# Patient Record
Sex: Female | Born: 1975 | Race: White | Hispanic: Yes | Marital: Married | Smoking: Never smoker
Health system: Southern US, Community
[De-identification: ages and names within clinical notes are randomized; demographics above are authoritative.]

## PROBLEM LIST (undated history)

## (undated) DIAGNOSIS — E7800 Pure hypercholesterolemia, unspecified: Secondary | ICD-10-CM

## (undated) DIAGNOSIS — F3181 Bipolar II disorder: Secondary | ICD-10-CM

## (undated) DIAGNOSIS — F419 Anxiety disorder, unspecified: Secondary | ICD-10-CM

## (undated) DIAGNOSIS — N6452 Nipple discharge: Secondary | ICD-10-CM

## (undated) HISTORY — DX: Bipolar II disorder: F31.81

## (undated) HISTORY — DX: Anxiety disorder, unspecified: F41.9

---

## 2006-06-24 ENCOUNTER — Ambulatory Visit: Payer: Self-pay | Admitting: Family Medicine

## 2006-08-01 ENCOUNTER — Emergency Department (HOSPITAL_COMMUNITY): Admission: EM | Admit: 2006-08-01 | Discharge: 2006-08-02 | Payer: Self-pay | Admitting: *Deleted

## 2006-08-02 ENCOUNTER — Encounter (INDEPENDENT_AMBULATORY_CARE_PROVIDER_SITE_OTHER): Payer: Self-pay | Admitting: Family Medicine

## 2006-08-02 ENCOUNTER — Ambulatory Visit: Payer: Self-pay | Admitting: Family Medicine

## 2006-09-16 ENCOUNTER — Ambulatory Visit: Payer: Self-pay | Admitting: Internal Medicine

## 2006-09-16 LAB — CONVERTED CEMR LAB
Cholesterol: 215 mg/dL — ABNORMAL HIGH (ref 0–200)
LDL Cholesterol: 146 mg/dL — ABNORMAL HIGH (ref 0–99)
Triglycerides: 190 mg/dL — ABNORMAL HIGH (ref <150)

## 2006-11-15 ENCOUNTER — Encounter: Payer: Self-pay | Admitting: Internal Medicine

## 2006-11-15 ENCOUNTER — Ambulatory Visit: Payer: Self-pay | Admitting: Internal Medicine

## 2006-11-15 LAB — CONVERTED CEMR LAB
ALT: 13 units/L (ref 0–35)
BUN: 9 mg/dL (ref 6–23)
CO2: 21 meq/L (ref 19–32)
Calcium: 9.3 mg/dL (ref 8.4–10.5)
Chloride: 106 meq/L (ref 96–112)
Cholesterol: 174 mg/dL (ref 0–200)
Creatinine, Ser: 0.64 mg/dL (ref 0.40–1.20)
Glucose, Bld: 101 mg/dL — ABNORMAL HIGH (ref 70–99)
Total CHOL/HDL Ratio: 5.8
Triglycerides: 161 mg/dL — ABNORMAL HIGH (ref <150)

## 2006-12-01 ENCOUNTER — Ambulatory Visit: Payer: Self-pay | Admitting: Internal Medicine

## 2007-01-04 ENCOUNTER — Ambulatory Visit: Payer: Self-pay | Admitting: *Deleted

## 2007-01-17 ENCOUNTER — Ambulatory Visit: Payer: Self-pay | Admitting: Internal Medicine

## 2007-01-17 LAB — CONVERTED CEMR LAB
Albumin: 4.4 g/dL (ref 3.5–5.2)
Alkaline Phosphatase: 113 units/L (ref 39–117)
BUN: 7 mg/dL (ref 6–23)
Calcium: 9.7 mg/dL (ref 8.4–10.5)
Chloride: 107 meq/L (ref 96–112)
Glucose, Bld: 93 mg/dL (ref 70–99)
HDL: 28 mg/dL — ABNORMAL LOW (ref >39)
Potassium: 4.3 meq/L (ref 3.5–5.3)
Triglycerides: 241 mg/dL — ABNORMAL HIGH (ref <150)

## 2007-01-24 ENCOUNTER — Ambulatory Visit: Payer: Self-pay | Admitting: Internal Medicine

## 2007-08-02 ENCOUNTER — Encounter (INDEPENDENT_AMBULATORY_CARE_PROVIDER_SITE_OTHER): Payer: Self-pay | Admitting: Family Medicine

## 2007-08-02 ENCOUNTER — Ambulatory Visit: Payer: Self-pay | Admitting: Internal Medicine

## 2007-08-02 LAB — CONVERTED CEMR LAB
ALT: 17 units/L (ref 0–35)
Alkaline Phosphatase: 104 units/L (ref 39–117)
Basophils Absolute: 0 10*3/uL (ref 0.0–0.1)
Eosinophils Absolute: 0.2 10*3/uL (ref 0.0–0.7)
Eosinophils Relative: 2 % (ref 0–5)
HCT: 41 % (ref 36.0–46.0)
LDL Cholesterol: 217 mg/dL — ABNORMAL HIGH (ref 0–99)
MCV: 93.2 fL (ref 78.0–100.0)
Platelets: 358 10*3/uL (ref 150–400)
RDW: 14.4 % (ref 11.5–15.5)
Sodium: 139 meq/L (ref 135–145)
Total Bilirubin: 0.3 mg/dL (ref 0.3–1.2)
Total Protein: 7.8 g/dL (ref 6.0–8.3)
Triglycerides: 220 mg/dL — ABNORMAL HIGH (ref <150)
VLDL: 44 mg/dL — ABNORMAL HIGH (ref 0–40)

## 2008-01-25 ENCOUNTER — Ambulatory Visit: Payer: Self-pay | Admitting: Internal Medicine

## 2008-02-23 ENCOUNTER — Ambulatory Visit: Payer: Self-pay | Admitting: Internal Medicine

## 2008-02-23 ENCOUNTER — Encounter (INDEPENDENT_AMBULATORY_CARE_PROVIDER_SITE_OTHER): Payer: Self-pay | Admitting: Internal Medicine

## 2008-02-23 LAB — CONVERTED CEMR LAB
AST: 13 units/L (ref 0–37)
Albumin: 4.2 g/dL (ref 3.5–5.2)
Alkaline Phosphatase: 122 units/L — ABNORMAL HIGH (ref 39–117)
BUN: 8 mg/dL (ref 6–23)
HDL: 26 mg/dL — ABNORMAL LOW (ref >39)
LDL Cholesterol: 132 mg/dL — ABNORMAL HIGH (ref 0–99)
Potassium: 4.2 meq/L (ref 3.5–5.3)
Sodium: 142 meq/L (ref 135–145)
TSH: 4.344 microintl units/mL (ref 0.350–4.50)
Total Bilirubin: 0.5 mg/dL (ref 0.3–1.2)
Total CHOL/HDL Ratio: 7.3
VLDL: 31 mg/dL (ref 0–40)

## 2008-07-05 ENCOUNTER — Ambulatory Visit: Payer: Self-pay | Admitting: Internal Medicine

## 2008-07-23 ENCOUNTER — Ambulatory Visit: Payer: Self-pay | Admitting: Internal Medicine

## 2008-09-04 ENCOUNTER — Ambulatory Visit: Payer: Self-pay | Admitting: Internal Medicine

## 2008-09-04 ENCOUNTER — Encounter (INDEPENDENT_AMBULATORY_CARE_PROVIDER_SITE_OTHER): Payer: Self-pay | Admitting: Adult Health

## 2008-09-18 ENCOUNTER — Encounter (INDEPENDENT_AMBULATORY_CARE_PROVIDER_SITE_OTHER): Payer: Self-pay | Admitting: Adult Health

## 2008-09-18 ENCOUNTER — Ambulatory Visit: Payer: Self-pay | Admitting: Internal Medicine

## 2008-09-18 LAB — CONVERTED CEMR LAB
ALT: 33 units/L (ref 0–35)
Alkaline Phosphatase: 86 units/L (ref 39–117)
Basophils Absolute: 0 10*3/uL (ref 0.0–0.1)
Basophils Relative: 0 % (ref 0–1)
Creatinine, Ser: 0.64 mg/dL (ref 0.40–1.20)
Eosinophils Absolute: 0.4 10*3/uL (ref 0.0–0.7)
LDL Cholesterol: 184 mg/dL — ABNORMAL HIGH (ref 0–99)
MCHC: 30.4 g/dL (ref 30.0–36.0)
Neutro Abs: 7.3 10*3/uL (ref 1.7–7.7)
Neutrophils Relative %: 60 % (ref 43–77)
Platelets: 328 10*3/uL (ref 150–400)
RBC: 4.2 M/uL (ref 3.87–5.11)
RDW: 14.9 % (ref 11.5–15.5)
Sodium: 138 meq/L (ref 135–145)
Total Bilirubin: 0.4 mg/dL (ref 0.3–1.2)
Total CHOL/HDL Ratio: 8.1
Total Protein: 7.4 g/dL (ref 6.0–8.3)
VLDL: 37 mg/dL (ref 0–40)

## 2008-11-29 ENCOUNTER — Ambulatory Visit: Payer: Self-pay | Admitting: Internal Medicine

## 2008-11-29 ENCOUNTER — Encounter (INDEPENDENT_AMBULATORY_CARE_PROVIDER_SITE_OTHER): Payer: Self-pay | Admitting: Adult Health

## 2008-11-29 LAB — CONVERTED CEMR LAB
ALT: 26 units/L (ref 0–35)
AST: 22 units/L (ref 0–37)
Albumin: 4.5 g/dL (ref 3.5–5.2)
Alkaline Phosphatase: 100 units/L (ref 39–117)
BUN: 7 mg/dL (ref 6–23)
Calcium: 9.6 mg/dL (ref 8.4–10.5)
Chloride: 108 meq/L (ref 96–112)
Creatinine, Ser: 0.69 mg/dL (ref 0.40–1.20)
HDL: 29 mg/dL — ABNORMAL LOW (ref >39)
LDL Cholesterol: 122 mg/dL — ABNORMAL HIGH (ref 0–99)
Potassium: 4 meq/L (ref 3.5–5.3)
Total CHOL/HDL Ratio: 6.4

## 2008-12-12 ENCOUNTER — Ambulatory Visit: Payer: Self-pay | Admitting: Internal Medicine

## 2009-03-05 ENCOUNTER — Encounter (INDEPENDENT_AMBULATORY_CARE_PROVIDER_SITE_OTHER): Payer: Self-pay | Admitting: Adult Health

## 2009-03-05 ENCOUNTER — Ambulatory Visit: Payer: Self-pay | Admitting: Internal Medicine

## 2009-03-05 LAB — CONVERTED CEMR LAB
ALT: 22 units/L (ref 0–35)
AST: 18 units/L (ref 0–37)
Alkaline Phosphatase: 98 units/L (ref 39–117)
Basophils Absolute: 0 10*3/uL (ref 0.0–0.1)
Basophils Relative: 0 % (ref 0–1)
CO2: 22 meq/L (ref 19–32)
Creatinine, Ser: 0.67 mg/dL (ref 0.40–1.20)
Eosinophils Absolute: 0.3 10*3/uL (ref 0.0–0.7)
Lithium Lvl: 0.71 meq/L — ABNORMAL LOW (ref 0.80–1.40)
MCHC: 31.6 g/dL (ref 30.0–36.0)
MCV: 91.7 fL (ref 78.0–100.0)
Monocytes Relative: 4 % (ref 3–12)
Neutro Abs: 6.9 10*3/uL (ref 1.7–7.7)
Neutrophils Relative %: 59 % (ref 43–77)
Platelets: 352 10*3/uL (ref 150–400)
RBC: 4.56 M/uL (ref 3.87–5.11)
Sodium: 138 meq/L (ref 135–145)
T3 Uptake Ratio: 28.4 % (ref 22.5–37.0)
T4, Total: 8.4 ug/dL (ref 5.0–12.5)
Total Bilirubin: 0.4 mg/dL (ref 0.3–1.2)
Total Protein: 8.1 g/dL (ref 6.0–8.3)

## 2009-03-06 ENCOUNTER — Ambulatory Visit: Payer: Self-pay | Admitting: Internal Medicine

## 2009-07-22 ENCOUNTER — Ambulatory Visit: Payer: Self-pay | Admitting: Internal Medicine

## 2009-08-27 ENCOUNTER — Ambulatory Visit: Payer: Self-pay | Admitting: Internal Medicine

## 2009-11-06 ENCOUNTER — Ambulatory Visit: Payer: Self-pay | Admitting: Obstetrics and Gynecology

## 2009-11-07 ENCOUNTER — Encounter: Payer: Self-pay | Admitting: Obstetrics and Gynecology

## 2009-11-07 LAB — CONVERTED CEMR LAB: Clue Cells Wet Prep HPF POC: NONE SEEN

## 2009-11-22 ENCOUNTER — Encounter (INDEPENDENT_AMBULATORY_CARE_PROVIDER_SITE_OTHER): Payer: Self-pay | Admitting: *Deleted

## 2009-11-22 LAB — CONVERTED CEMR LAB
ALT: 16 units/L (ref 0–35)
AST: 15 units/L (ref 0–37)
CO2: 23 meq/L (ref 19–32)
Calcium: 9.8 mg/dL (ref 8.4–10.5)
Chloride: 107 meq/L (ref 96–112)
Creatinine, Ser: 0.59 mg/dL (ref 0.40–1.20)
Lithium Lvl: 0.54 meq/L — ABNORMAL LOW (ref 0.80–1.40)
Sodium: 140 meq/L (ref 135–145)
Total CHOL/HDL Ratio: 9.7
Total Protein: 7.9 g/dL (ref 6.0–8.3)
VLDL: 39 mg/dL (ref 0–40)

## 2010-03-03 ENCOUNTER — Emergency Department (HOSPITAL_COMMUNITY)
Admission: EM | Admit: 2010-03-03 | Discharge: 2010-03-03 | Payer: Self-pay | Source: Home / Self Care | Admitting: Emergency Medicine

## 2010-03-04 LAB — POCT PREGNANCY, URINE: Preg Test, Ur: NEGATIVE

## 2010-03-26 ENCOUNTER — Encounter (INDEPENDENT_AMBULATORY_CARE_PROVIDER_SITE_OTHER): Payer: Self-pay | Admitting: *Deleted

## 2010-03-26 LAB — CONVERTED CEMR LAB
ALT: 19 units/L (ref 0–35)
Albumin: 4.5 g/dL (ref 3.5–5.2)
Alkaline Phosphatase: 91 units/L (ref 39–117)
CO2: 24 meq/L (ref 19–32)
LDL Cholesterol: 156 mg/dL — ABNORMAL HIGH (ref 0–99)
Potassium: 4.3 meq/L (ref 3.5–5.3)
Sodium: 137 meq/L (ref 135–145)
Total Bilirubin: 0.4 mg/dL (ref 0.3–1.2)
Total Protein: 7.5 g/dL (ref 6.0–8.3)
VLDL: 45 mg/dL — ABNORMAL HIGH (ref 0–40)

## 2011-03-02 ENCOUNTER — Encounter (HOSPITAL_COMMUNITY): Payer: Self-pay

## 2011-03-02 ENCOUNTER — Emergency Department (HOSPITAL_COMMUNITY)
Admission: EM | Admit: 2011-03-02 | Discharge: 2011-03-02 | Disposition: A | Discharge status: Home / Self Care | Payer: Self-pay | Attending: Emergency Medicine | Admitting: Emergency Medicine

## 2011-03-02 ENCOUNTER — Emergency Department (HOSPITAL_COMMUNITY): Payer: Self-pay

## 2011-03-02 DIAGNOSIS — R109 Unspecified abdominal pain: Secondary | ICD-10-CM | POA: Insufficient documentation

## 2011-03-02 DIAGNOSIS — R112 Nausea with vomiting, unspecified: Secondary | ICD-10-CM | POA: Insufficient documentation

## 2011-03-02 DIAGNOSIS — E7800 Pure hypercholesterolemia, unspecified: Secondary | ICD-10-CM | POA: Insufficient documentation

## 2011-03-02 DIAGNOSIS — K5900 Constipation, unspecified: Secondary | ICD-10-CM | POA: Insufficient documentation

## 2011-03-02 HISTORY — DX: Pure hypercholesterolemia, unspecified: E78.00

## 2011-03-02 LAB — URINE MICROSCOPIC-ADD ON

## 2011-03-02 LAB — URINALYSIS, ROUTINE W REFLEX MICROSCOPIC
Nitrite: NEGATIVE
Protein, ur: NEGATIVE mg/dL
Urobilinogen, UA: 2 mg/dL — ABNORMAL HIGH (ref 0.0–1.0)

## 2011-03-02 LAB — DIFFERENTIAL
Basophils Absolute: 0 10*3/uL (ref 0.0–0.1)
Eosinophils Relative: 1 % (ref 0–5)
Lymphocytes Relative: 19 % (ref 12–46)
Neutro Abs: 12.3 10*3/uL — ABNORMAL HIGH (ref 1.7–7.7)
Neutrophils Relative %: 79 % — ABNORMAL HIGH (ref 43–77)

## 2011-03-02 LAB — COMPREHENSIVE METABOLIC PANEL
ALT: 26 U/L (ref 0–35)
AST: 25 U/L (ref 0–37)
CO2: 25 mEq/L (ref 19–32)
Calcium: 10.3 mg/dL (ref 8.4–10.5)
GFR calc non Af Amer: >90 mL/min (ref >90)
Sodium: 136 mEq/L (ref 135–145)

## 2011-03-02 LAB — CBC
Platelets: 331 10*3/uL (ref 150–400)
RDW: 13.7 % (ref 11.5–15.5)
WBC: 15.7 10*3/uL — ABNORMAL HIGH (ref 4.0–10.5)

## 2011-03-02 MED ORDER — ONDANSETRON HCL 4 MG/2ML IJ SOLN
4.0000 mg | Freq: Once | INTRAMUSCULAR | Status: AC
  Administered 2011-03-02: 4 mg via INTRAVENOUS
  Filled 2011-03-02: qty 2

## 2011-03-02 MED ORDER — PROMETHAZINE HCL 25 MG PO TABS: 25.0000 mg | ORAL_TABLET | Freq: Four times a day (QID) | ORAL | Status: AC | PRN

## 2011-03-02 MED ORDER — IOHEXOL 300 MG/ML  SOLN
100.0000 mL | Freq: Once | INTRAMUSCULAR | Status: AC | PRN
  Administered 2011-03-02: 100 mL via INTRAVENOUS

## 2011-03-02 MED ORDER — SODIUM CHLORIDE 0.9 % IV SOLN
INTRAVENOUS | Status: DC
  Administered 2011-03-02: 03:00:00 via INTRAVENOUS

## 2011-03-02 MED ORDER — MORPHINE SULFATE 4 MG/ML IJ SOLN
4.0000 mg | Freq: Once | INTRAMUSCULAR | Status: AC
  Administered 2011-03-02: 4 mg via INTRAVENOUS
  Filled 2011-03-02: qty 1

## 2011-03-02 NOTE — ED Notes (Signed)
Patient c/o abdominal pain x 1 hr w/vomiting; EMS states she has generalized pain somewhat great in the RLQ; Status of appendix unknown.  Per EMS, she said she felt like she is pregnant, but she believes she is not.  Patient is Spanish speaking.  Her son is on the way and bilingual.  States no BM today.  BM yesterday.  Home meds:  Haldol, Pravastatin, lithium, Klonapine.

## 2011-03-02 NOTE — ED Notes (Signed)
Advised CT the patient finished her contrast.

## 2011-03-02 NOTE — ED Notes (Signed)
D/C instructions reviewed via phone interpreter. Pt verbalized understanding. NAD at d/c.

## 2011-03-02 NOTE — ED Provider Notes (Signed)
History     CSN: [id number]  Arrival date & time 03/02/11  0224   First MD Initiated Contact with Patient 03/02/11 0246      Chief Complaint  Patient presents with  . Emesis  . Abdominal Pain    (Consider location/radiation/quality/duration/timing/severity/associated sxs/prior treatment) Patient is a 36 y.o. female presenting with vomiting and abdominal pain. The history is provided by the patient and a relative. The history is limited by a language barrier. A language interpreter was used.  Emesis  Associated symptoms include abdominal pain. Pertinent negatives include no diarrhea, no fever and no headaches.  Abdominal Pain The primary symptoms of the illness include abdominal pain, nausea and vomiting. The primary symptoms of the illness do not include fever, diarrhea, dysuria or vaginal bleeding.  Additional symptoms associated with the illness include constipation. Symptoms associated with the illness do not include hematuria or back pain.   the patient is a 35 year old, female, with no significant past medical history, except for having a C-section.  She presents to the emergency department complaining of lower abdominal pain, and nausea and vomiting.  For the past few hours.  She denies diarrhea.  She denies urinary tract symptoms.  Her last period was on December 21 it was normal.  She denies fevers, chills, or respiratory symptoms.  Her son, who speaks perfect English servings as Nurse, learning disability. Past Medical History  Diagnosis Date  . Hypercholesteremia     Past Surgical History  Procedure Date  . Cesarean section     No family history on file.  History  Substance Use Topics  . Smoking status: Never Smoker   . Smokeless tobacco: Not on file  . Alcohol Use: No    OB History    Grav Para Term Preterm Abortions TAB SAB Ect Mult Living                  Review of Systems  Constitutional: Negative for fever.  Eyes: Negative for redness.  Gastrointestinal: Positive for  nausea, vomiting, abdominal pain and constipation. Negative for diarrhea.  Genitourinary: Negative for dysuria, hematuria and vaginal bleeding.  Musculoskeletal: Negative for back pain.  Skin: Negative for rash.  Neurological: Negative for light-headedness, numbness and headaches.  Psychiatric/Behavioral: Negative for confusion.  All other systems reviewed and are negative.    Allergies  Review of patient's allergies indicates no known allergies.  Home Medications  No current outpatient prescriptions on file.  BP 129/79  Pulse 87  Temp(Src) 98.1 F (36.7 C) (Oral)  Resp 19  SpO2 99%  LMP 01/30/2011  Physical Exam  Constitutional: She is oriented to person, place, and time. She appears well-developed and well-nourished.       Uncomfortable appearing  HENT:  Head: Normocephalic and atraumatic.  Eyes: Pupils are equal, round, and reactive to light.  Neck: Normal range of motion.  Cardiovascular: Normal rate, regular rhythm and normal heart sounds.   No murmur heard. Pulmonary/Chest: Effort normal and breath sounds normal. No respiratory distress. She has no wheezes. She has no rales.  Abdominal: Soft. She exhibits no distension and no mass. There is tenderness. There is no rebound and no guarding.       Suprapubic and right lower quadrant tenderness without guarding  Musculoskeletal: Normal range of motion. She exhibits no edema and no tenderness.  Neurological: She is alert and oriented to person, place, and time. No cranial nerve deficit.  Skin: Skin is warm and dry. No rash noted. No erythema.  Psychiatric: She has  a normal mood and affect. Her behavior is normal.    ED Course  Procedures (including critical care time) 36 year old, female, with no significant past medical history presents with abdominal pain, and nausea and vomiting.  For the past few hours.  No other symptoms.  She has mild tenderness, with no peritoneal signs in the lower abdomen.  We will perform  laboratory testing, and a CAT scan of her abdomen and treat her symptomatically.   Labs Reviewed  PREGNANCY, URINE  URINALYSIS, ROUTINE W REFLEX MICROSCOPIC  CBC  DIFFERENTIAL  COMPREHENSIVE METABOLIC PANEL   No results found.   No diagnosis found.  7:23 AM I spoke with the radiologist about the ct.  We specifically went over the results again after I provided him with the info that the wbc = 15.7K  He said no appy or other severe acute process  I explained findings to pt with pacific interpreter phones. Her son had left.   Her pain is almost all gone.  Her nausea is gone.  She has minimal ttp in suprapubic area.   Will release.   MDM   Abdominal pain        Nicholes Stairs, MD 03/02/11 [id number]

## 2011-03-02 NOTE — ED Notes (Signed)
Patient was advised by CT to drink her contrast. I advised the patient to drink it slowly to avoid G/I upset.

## 2011-03-02 NOTE — ED Notes (Signed)
Asked pt for urine x2, states she does not have to go. Will try again in 15 minutes

## 2011-03-02 NOTE — ED Notes (Signed)
Spoke with patient via interpreter phones and introduced self and pt has mid abd pain that hurts a litte.  vss and bowel sounds hypoactive

## 2011-03-02 NOTE — ED Notes (Signed)
The patient's sister gave me her husband's phone number and advised that we should call him.  His name is Exie Parody, and he can be reached @ [phone].

## 2011-03-02 NOTE — ED Notes (Signed)
Patient states she wants something for nausea.

## 2011-03-02 NOTE — ED Notes (Signed)
Reminded patient to continue drinking her contrast.

## 2011-06-24 ENCOUNTER — Encounter: Payer: Self-pay | Admitting: Obstetrics and Gynecology

## 2012-03-09 ENCOUNTER — Ambulatory Visit: Payer: Self-pay | Admitting: Emergency Medicine

## 2012-04-01 ENCOUNTER — Ambulatory Visit (INDEPENDENT_AMBULATORY_CARE_PROVIDER_SITE_OTHER): Payer: Self-pay | Admitting: Emergency Medicine

## 2012-04-01 ENCOUNTER — Encounter: Payer: Self-pay | Admitting: Emergency Medicine

## 2012-04-01 VITALS — BP 109/74 | HR 64 | Ht 62.5 in | Wt 178.0 lb

## 2012-04-01 DIAGNOSIS — B351 Tinea unguium: Secondary | ICD-10-CM

## 2012-04-01 DIAGNOSIS — E66811 Obesity, class 1: Secondary | ICD-10-CM

## 2012-04-01 DIAGNOSIS — Z0000 Encounter for general adult medical examination without abnormal findings: Secondary | ICD-10-CM

## 2012-04-01 DIAGNOSIS — S90211A Contusion of right great toe with damage to nail, initial encounter: Secondary | ICD-10-CM

## 2012-04-01 DIAGNOSIS — S90129A Contusion of unspecified lesser toe(s) without damage to nail, initial encounter: Secondary | ICD-10-CM

## 2012-04-01 DIAGNOSIS — E669 Obesity, unspecified: Secondary | ICD-10-CM

## 2012-04-01 NOTE — Assessment & Plan Note (Addendum)
No red flags today.  Obese.  Will start screening mammogram at 38 given history of sister with breast cancer in 48s.  Will check screening labs and pap smear once she gets the orange card.

## 2012-04-01 NOTE — Progress Notes (Signed)
Interpreter Nirvana Blanchett Namihira for Dr Booth 

## 2012-04-01 NOTE — Progress Notes (Signed)
  Subjective:    Patient ID: Vanessa Harrison, female    DOB: May 17, 1975, 37 y.o.   MRN: [id number]  HPI Jaclyne Haverstick is here for a NP appointment.  New Patient I have reviewed and updated the following as appropriate: allergies, current medications, past family history, past medical history, past social history, past surgical history and problem list PMHx: bipolar - seen by Dr. Ladona Ridgel on lithium, klonopin and haldol  FHx: sister with breast cancer in 17s SHx: never smoker  Toenail issues Reports that her right great toenail turned black about 2 months ago.  Denies any trauma but states that she doesn't have very good shoes.  No pain.  Left great toenail is yellowed.  Review of Systems Patient Information Form: Screening and ROS Review of Symptoms  General:  Negative for nexplained weight loss, fever Skin: Negative for new or changing mole, sore that won't heal HEENT: Negative for trouble hearing, trouble seeing, ringing in ears, mouth sores, hoarseness, change in voice, dysphagia. CV:  Negative for chest pain, dyspnea, edema, palpitations Resp: Negative for cough, dyspnea, hemoptysis GI: Negative for nausea, vomiting, diarrhea, constipation, abdominal pain, melena, hematochezia. GU: Negative for dysuria, incontinence, urinary hesitance, hematuria, vaginal or penile discharge, polyuria, sexual difficulty, lumps in testicle or breasts MSK: Negative for muscle cramps or aches, joint pain or swelling Neuro: Negative for headaches, weakness, numbness, dizziness, passing out/fainting Psych: Negative for depression, + anxiety, memory problems      Objective:   Physical Exam BP 109/74  Pulse 64  Ht 5' 2.5" (1.588 m)  Wt 178 lb (80.74 kg)  BMI 32.02 kg/m2  LMP 03/16/2012 Gen: alert, cooperative, NAD HEENT: AT/[place], sclera white, MMM Neck: supple, no LAD CV: RRR, no murmurs Pulm: CTAB, no wheezes or rales Abd: +BS, soft, NTND Ext: no edema, 2+ DP pulses bilaterally Skin: black  discoloration of right great toe nail; left great toenail thickened and friable. Neuro: alert, oriented, no focal deficits     Assessment & Plan:   Onychomycosis: left great toenail.  Discussed options with include trying OTC Lamisil which will not likely work.  Alternative involves trying an oral medication - reviewed risks and will not pursue at this time.  She does state that a relative brought her an injection for fungus that she has received.  She will bring it in at her next appointment to see if it is safe to use.  Right great toe discoloration:  Likely related to pressure from poor footwear.  Discussed that it will eventually grow out, but may take up to 6 months.

## 2012-04-01 NOTE — Patient Instructions (Addendum)
Fue Psychiatrist conocerte!  Leanna Sato cita para verme para una prueba de Papanicolaou y conseguir que su extraccin de sangre despus de recibir la tarjeta naranja.

## 2012-09-09 HISTORY — PX: APPENDECTOMY: SHX54

## 2012-09-22 ENCOUNTER — Emergency Department (HOSPITAL_COMMUNITY)
Admission: EM | Admit: 2012-09-22 | Discharge: 2012-09-23 | Disposition: A | Discharge status: Home / Self Care | Payer: Self-pay | Attending: Emergency Medicine | Admitting: Emergency Medicine

## 2012-09-22 ENCOUNTER — Encounter (HOSPITAL_COMMUNITY): Payer: Self-pay | Admitting: Emergency Medicine

## 2012-09-22 DIAGNOSIS — F419 Anxiety disorder, unspecified: Secondary | ICD-10-CM

## 2012-09-22 DIAGNOSIS — F411 Generalized anxiety disorder: Secondary | ICD-10-CM | POA: Insufficient documentation

## 2012-09-22 DIAGNOSIS — F3189 Other bipolar disorder: Secondary | ICD-10-CM | POA: Insufficient documentation

## 2012-09-22 DIAGNOSIS — Z79899 Other long term (current) drug therapy: Secondary | ICD-10-CM | POA: Insufficient documentation

## 2012-09-22 DIAGNOSIS — Z862 Personal history of diseases of the blood and blood-forming organs and certain disorders involving the immune mechanism: Secondary | ICD-10-CM | POA: Insufficient documentation

## 2012-09-22 DIAGNOSIS — Z8639 Personal history of other endocrine, nutritional and metabolic disease: Secondary | ICD-10-CM | POA: Insufficient documentation

## 2012-09-22 NOTE — ED Notes (Signed)
PT. REPORTS DIFFICULTY SWALLOWING / THICK MUCUS IN THROAT FOR SEVERAL DAYS WORSE WHEN LYING ON BED , RESPIRATIONS UNLABORED / AIRWAY INTACT , INTERPRETER PHONE ( SPANISH) USED AT TRIAGE . DENIES SORE THROAT . NO FEVER OR CHILLS.

## 2012-09-23 ENCOUNTER — Encounter (HOSPITAL_COMMUNITY): Payer: Self-pay | Admitting: Radiology

## 2012-09-23 ENCOUNTER — Emergency Department (HOSPITAL_COMMUNITY): Payer: Self-pay

## 2012-09-23 LAB — POCT I-STAT, CHEM 8
Glucose, Bld: 101 mg/dL — ABNORMAL HIGH (ref 70–99)
HCT: 40 % (ref 36.0–46.0)
Hemoglobin: 13.6 g/dL (ref 12.0–15.0)
Potassium: 3.7 mEq/L (ref 3.5–5.1)
Sodium: 140 mEq/L (ref 135–145)
TCO2: 21 mmol/L (ref 0–100)

## 2012-09-23 LAB — RAPID STREP SCREEN (MED CTR MEBANE ONLY): Streptococcus, Group A Screen (Direct): NEGATIVE

## 2012-09-23 MED ORDER — CLONAZEPAM 1 MG PO TABS: 1.0000 mg | ORAL_TABLET | Freq: Two times a day (BID) | ORAL | Status: DC | PRN

## 2012-09-23 MED ORDER — IOHEXOL 300 MG/ML  SOLN
75.0000 mL | Freq: Once | INTRAMUSCULAR | Status: AC | PRN
  Administered 2012-09-23: 75 mL via INTRAVENOUS

## 2012-09-23 NOTE — ED Provider Notes (Signed)
Medical screening examination/treatment/procedure(s) were performed by non-physician practitioner and as supervising physician I was immediately available for consultation/collaboration.   Charlen Bakula, MD 09/23/12 0550 

## 2012-09-23 NOTE — ED Notes (Signed)
Pt transported to CT ?

## 2012-09-23 NOTE — ED Notes (Signed)
Pt back from CT

## 2012-09-23 NOTE — ED Provider Notes (Signed)
CSN: [id number]     Arrival date & time 09/22/12  2245 History     None    Chief Complaint  Patient presents with  . Dysphagia   (Consider location/radiation/quality/duration/timing/severity/associated sxs/prior Treatment) HPI History provided by pt and interpreter.   Pt a very poor historian.  Has h/o bipolar disorder and anxiety.  Most recently saw her psychiatrist on 7/28 and is to be weaned off of her clonazepam.  She states that for the last 15 days, she has had increased phlegm that she is unable to clear from her throat.  Usually notices in the evening when she sits down to watch tv, or when she's lying in bed.  When this occurs, she feel like her throat is closing and that she can't breath, and then the walls start to close in on her and she panics.  She can't be in the dark and would like to keep all the doors open, but can't because of the mosquitos.  The throat closing sensation precedes her anxiety.  She has also had dysphagia over the past several days, but gets her food down w/out vomiting.  No associated fever, nasal congestion, rhinorrhea, sinus pressure, sore throat or cough.  No relief w/ advil cold and allergy.  Was evaluated by her PCP 2 days ago and diagnosed w/ GERD, though she does not believe this is the etiology of her symptoms.  Has not had epigastric or chest pain.   Past Medical History  Diagnosis Date  . Hypercholesteremia   . Anxiety   . Bipolar 2 disorder    Past Surgical History  Procedure Laterality Date  . Cesarean section     Family History  Problem Relation Age of Onset  . Diabetes Mother   . Hypertension Mother   . Diabetes Sister   . Hypertension Sister   . Cancer Sister 2    breast  . Hypertension Brother    History  Substance Use Topics  . Smoking status: Never Smoker   . Smokeless tobacco: Never Used  . Alcohol Use: No   OB History   Grav Para Term Preterm Abortions TAB SAB Ect Mult Living   4 1   3     1      Review of Systems   All other systems reviewed and are negative.    Allergies  Review of patient's allergies indicates no known allergies.  Home Medications   Current Outpatient Rx  Name  Route  Sig  Dispense  Refill  . clonazePAM (KLONOPIN) 0.5 MG tablet   Oral   Take 0.5 mg by mouth at bedtime as needed for anxiety.          . haloperidol (HALDOL) 2 MG tablet   Oral   Take 2 mg by mouth at bedtime.         Marland Kitchen lithium carbonate 300 MG capsule   Oral   Take 600 mg by mouth 2 (two) times daily with a meal.          BP 145/75  Pulse 78  Temp(Src) 98.3 F (36.8 C) (Oral)  Resp 20  SpO2 98% Physical Exam  Nursing note and vitals reviewed. Constitutional: She is oriented to person, place, and time. She appears well-developed and well-nourished. No distress.  HENT:  Head: Normocephalic and atraumatic.  Erythema soft palate and tonsils.  Tonsils symmetric and w/out edema/exudate.  No drainage in posterior pharynx   Eyes:  Normal appearance  Neck: Normal range of motion. Neck supple.  No tracheal deviation present. No thyromegaly present.  No mass  Cardiovascular: Normal rate and regular rhythm.   Pulmonary/Chest: Effort normal and breath sounds normal. No stridor. No respiratory distress.  Musculoskeletal: Normal range of motion.  Lymphadenopathy:    She has cervical adenopathy.  Neurological: She is alert and oriented to person, place, and time.  Skin: Skin is warm and dry. No rash noted.  Psychiatric: She has a normal mood and affect. Her behavior is normal.    ED Course   Procedures (including critical care time)  Labs Reviewed  POCT I-STAT, CHEM 8 - Abnormal; Notable for the following:    Glucose, Bld 101 (*)    Calcium, Ion 1.26 (*)    All other components within normal limits  RAPID STREP SCREEN  CULTURE, GROUP A STREP   Ct Soft Tissue Neck W Contrast  09/23/2012   *RADIOLOGY REPORT*  Clinical Data: Dysphasia.  CT NECK WITH CONTRAST  Technique:  Multidetector CT  imaging of the neck was performed with intravenous contrast.  Contrast: 75mL OMNIPAQUE IOHEXOL 300 MG/ML  SOLN  Comparison: None.  Findings: No evidence of mass, edema, of other significant abnormality involving the aerodigestive tract.  The palatine tonsils are mildly enlarged for age, and may reflect a tonsillitis. No abscess or deep space inflammation.  Mild enlargement of submental lymph nodes, possibly reactive to the above.  The major vascular structures, salivary glands, and upper lungs are unremarkable. Possible disc herniation at C5-6, which would contact the ventral cord. Certainty limited by CT.  Minimal, ill-defined low attenuation in the left lobe thyroid gland, likely incidental.  IMPRESSION: Negative CT of the neck. No mass or pharyngeal edema.   Original Report Authenticated By: Tiburcio Pea   1. Anxiety     MDM  37yo F w/ anxiety and bipolar disorder, otherwise healthy, presents w/ 2+ wks of dysphagia and sensation that throat is closing and that she can't clear her mucous.  No associated URI sx, including sore throat.  Also reports being afraid of the dark and clostrophobic.  Objective findings on exam include soft palate erythema and cervical adenopathy.  Rapid strep screen negative.  Characteristics of symptoms most consistent w/ anxiety and/or globus hystericus, but because patient is such a poor historian and there is a communication barrier,  CT soft tissue neck ordered and pending. 1:49 AM   Strep screen negative and CT neck unremarkable.  Results discussed w/ pt.  Explained to her that her symptoms are most likely secondary to anxiety.  Recommended that she increase her dose of klonopin from 0.25mg  to 0.5mg  as needed and f/u with her psychiatrist asap.   Advised return for worsening symptoms.  5:06 AM   Otilio Miu, PA-C 09/23/12 [phone]

## 2012-09-24 ENCOUNTER — Observation Stay (HOSPITAL_COMMUNITY): Payer: Self-pay | Admitting: Anesthesiology

## 2012-09-24 ENCOUNTER — Ambulatory Visit (HOSPITAL_COMMUNITY)
Admission: EM | Admit: 2012-09-24 | Discharge: 2012-09-25 | Disposition: A | Discharge status: Home / Self Care | Payer: MEDICAID | Attending: Surgery | Admitting: Surgery

## 2012-09-24 ENCOUNTER — Encounter (HOSPITAL_COMMUNITY): Payer: Self-pay | Admitting: *Deleted

## 2012-09-24 ENCOUNTER — Emergency Department (HOSPITAL_COMMUNITY): Payer: Self-pay

## 2012-09-24 ENCOUNTER — Encounter (HOSPITAL_COMMUNITY)
Admission: EM | Disposition: A | Discharge status: Home / Self Care | Payer: Self-pay | Source: Home / Self Care | Attending: Emergency Medicine

## 2012-09-24 ENCOUNTER — Encounter (HOSPITAL_COMMUNITY): Payer: Self-pay | Admitting: Anesthesiology

## 2012-09-24 DIAGNOSIS — K3580 Unspecified acute appendicitis: Secondary | ICD-10-CM | POA: Insufficient documentation

## 2012-09-24 DIAGNOSIS — F319 Bipolar disorder, unspecified: Secondary | ICD-10-CM | POA: Insufficient documentation

## 2012-09-24 DIAGNOSIS — K37 Unspecified appendicitis: Secondary | ICD-10-CM

## 2012-09-24 DIAGNOSIS — R109 Unspecified abdominal pain: Secondary | ICD-10-CM

## 2012-09-24 DIAGNOSIS — E7800 Pure hypercholesterolemia, unspecified: Secondary | ICD-10-CM | POA: Insufficient documentation

## 2012-09-24 DIAGNOSIS — R1033 Periumbilical pain: Secondary | ICD-10-CM | POA: Insufficient documentation

## 2012-09-24 HISTORY — PX: LAPAROSCOPIC APPENDECTOMY: SHX408

## 2012-09-24 LAB — URINALYSIS, ROUTINE W REFLEX MICROSCOPIC
Nitrite: NEGATIVE
Specific Gravity, Urine: 1.023 (ref 1.005–1.030)
Urobilinogen, UA: 1 mg/dL (ref 0.0–1.0)
pH: 7 (ref 5.0–8.0)

## 2012-09-24 LAB — CBC WITH DIFFERENTIAL/PLATELET
Basophils Relative: 0 % (ref 0–1)
Eosinophils Absolute: 0.1 10*3/uL (ref 0.0–0.7)
Eosinophils Relative: 1 % (ref 0–5)
Hemoglobin: 13 g/dL (ref 12.0–15.0)
MCH: 31.3 pg (ref 26.0–34.0)
MCHC: 35.8 g/dL (ref 30.0–36.0)
MCV: 87.3 fL (ref 78.0–100.0)
Monocytes Absolute: 0.7 10*3/uL (ref 0.1–1.0)
Monocytes Relative: 4 % (ref 3–12)
Neutrophils Relative %: 81 % — ABNORMAL HIGH (ref 43–77)

## 2012-09-24 LAB — COMPREHENSIVE METABOLIC PANEL
Albumin: 4 g/dL (ref 3.5–5.2)
BUN: 10 mg/dL (ref 6–23)
Calcium: 9.8 mg/dL (ref 8.4–10.5)
Creatinine, Ser: 0.52 mg/dL (ref 0.50–1.10)
GFR calc Af Amer: >90 mL/min (ref >90)
Glucose, Bld: 119 mg/dL — ABNORMAL HIGH (ref 70–99)
Potassium: 4 mEq/L (ref 3.5–5.1)
Total Protein: 7.9 g/dL (ref 6.0–8.3)

## 2012-09-24 LAB — URINE MICROSCOPIC-ADD ON

## 2012-09-24 LAB — RAPID URINE DRUG SCREEN, HOSP PERFORMED
Cocaine: NOT DETECTED
Opiates: NOT DETECTED
Tetrahydrocannabinol: NOT DETECTED

## 2012-09-24 LAB — LIPASE, BLOOD: Lipase: 18 U/L (ref 11–59)

## 2012-09-24 LAB — CULTURE, GROUP A STREP

## 2012-09-24 SURGERY — APPENDECTOMY, LAPAROSCOPIC
Anesthesia: General | Wound class: Contaminated

## 2012-09-24 MED ORDER — PROMETHAZINE HCL 25 MG/ML IJ SOLN
6.2500 mg | Freq: Once | INTRAMUSCULAR | Status: AC
  Administered 2012-09-24: 6.25 mg via INTRAVENOUS

## 2012-09-24 MED ORDER — MIDAZOLAM HCL 5 MG/5ML IJ SOLN
INTRAMUSCULAR | Status: DC | PRN
  Administered 2012-09-24: 2 mg via INTRAVENOUS

## 2012-09-24 MED ORDER — PIPERACILLIN-TAZOBACTAM 3.375 G IVPB
3.3750 g | Freq: Three times a day (TID) | INTRAVENOUS | Status: DC
  Administered 2012-09-24: 3.375 g via INTRAVENOUS
  Filled 2012-09-24 (×3): qty 50

## 2012-09-24 MED ORDER — LACTATED RINGERS IV SOLN
INTRAVENOUS | Status: DC | PRN
  Administered 2012-09-24: 11:00:00 via INTRAVENOUS

## 2012-09-24 MED ORDER — ONDANSETRON HCL 4 MG/2ML IJ SOLN
INTRAMUSCULAR | Status: DC | PRN
  Administered 2012-09-24: 4 mg via INTRAVENOUS

## 2012-09-24 MED ORDER — ONDANSETRON HCL 4 MG/2ML IJ SOLN
4.0000 mg | Freq: Once | INTRAMUSCULAR | Status: AC | PRN
  Administered 2012-09-24: 4 mg via INTRAVENOUS

## 2012-09-24 MED ORDER — IOHEXOL 300 MG/ML  SOLN
25.0000 mL | Freq: Once | INTRAMUSCULAR | Status: AC | PRN
  Administered 2012-09-24: 25 mL via ORAL

## 2012-09-24 MED ORDER — GLYCOPYRROLATE 0.2 MG/ML IJ SOLN
INTRAMUSCULAR | Status: DC | PRN
  Administered 2012-09-24: 0.4 mg via INTRAVENOUS

## 2012-09-24 MED ORDER — HYDROMORPHONE HCL PF 1 MG/ML IJ SOLN: 1.0000 mg | INTRAMUSCULAR | Status: DC | PRN

## 2012-09-24 MED ORDER — PANTOPRAZOLE SODIUM 40 MG IV SOLR
40.0000 mg | Freq: Every day | INTRAVENOUS | Status: DC
  Administered 2012-09-24: 40 mg via INTRAVENOUS
  Filled 2012-09-24 (×2): qty 40

## 2012-09-24 MED ORDER — LIDOCAINE HCL (CARDIAC) 20 MG/ML IV SOLN
INTRAVENOUS | Status: DC | PRN
  Administered 2012-09-24: 80 mg via INTRAVENOUS

## 2012-09-24 MED ORDER — LORAZEPAM 2 MG/ML IJ SOLN
1.0000 mg | Freq: Once | INTRAMUSCULAR | Status: AC
  Administered 2012-09-24: 1 mg via INTRAVENOUS
  Filled 2012-09-24: qty 1

## 2012-09-24 MED ORDER — DEXAMETHASONE SODIUM PHOSPHATE 4 MG/ML IJ SOLN
INTRAMUSCULAR | Status: DC | PRN
  Administered 2012-09-24: 4 mg via INTRAVENOUS

## 2012-09-24 MED ORDER — HALOPERIDOL 2 MG PO TABS
2.0000 mg | ORAL_TABLET | Freq: Every day | ORAL | Status: DC
  Administered 2012-09-24: 2 mg via ORAL
  Filled 2012-09-24 (×2): qty 1

## 2012-09-24 MED ORDER — SUCCINYLCHOLINE CHLORIDE 20 MG/ML IJ SOLN
INTRAMUSCULAR | Status: DC | PRN
  Administered 2012-09-24: 100 mg via INTRAVENOUS

## 2012-09-24 MED ORDER — CLONAZEPAM 0.5 MG PO TABS
0.5000 mg | ORAL_TABLET | Freq: Every day | ORAL | Status: DC
  Administered 2012-09-24: 0.5 mg via ORAL
  Filled 2012-09-24: qty 1

## 2012-09-24 MED ORDER — PROMETHAZINE HCL 25 MG/ML IJ SOLN
INTRAMUSCULAR | Status: AC
  Filled 2012-09-24: qty 1

## 2012-09-24 MED ORDER — LITHIUM CARBONATE 300 MG PO CAPS
600.0000 mg | ORAL_CAPSULE | Freq: Two times a day (BID) | ORAL | Status: DC
  Administered 2012-09-24 – 2012-09-25 (×2): 600 mg via ORAL
  Filled 2012-09-24 (×4): qty 2

## 2012-09-24 MED ORDER — OXYCODONE HCL 5 MG PO TABS: 5.0000 mg | ORAL_TABLET | Freq: Once | ORAL | Status: DC | PRN

## 2012-09-24 MED ORDER — HYDROCODONE-ACETAMINOPHEN 5-325 MG PO TABS: 1.0000 | ORAL_TABLET | ORAL | Status: DC | PRN

## 2012-09-24 MED ORDER — DIPHENHYDRAMINE HCL 50 MG/ML IJ SOLN: 12.5000 mg | Freq: Four times a day (QID) | INTRAMUSCULAR | Status: DC | PRN

## 2012-09-24 MED ORDER — ONDANSETRON HCL 4 MG/2ML IJ SOLN
4.0000 mg | Freq: Once | INTRAMUSCULAR | Status: AC
  Administered 2012-09-24: 4 mg via INTRAVENOUS
  Filled 2012-09-24: qty 2

## 2012-09-24 MED ORDER — ONDANSETRON HCL 4 MG/2ML IJ SOLN: 4.0000 mg | Freq: Four times a day (QID) | INTRAMUSCULAR | Status: DC | PRN

## 2012-09-24 MED ORDER — PIPERACILLIN-TAZOBACTAM 3.375 G IVPB 30 MIN
3.3750 g | INTRAVENOUS | Status: AC
  Administered 2012-09-24: 3.375 g via INTRAVENOUS
  Filled 2012-09-24: qty 50

## 2012-09-24 MED ORDER — ROCURONIUM BROMIDE 100 MG/10ML IV SOLN
INTRAVENOUS | Status: DC | PRN
  Administered 2012-09-24: 30 mg via INTRAVENOUS
  Administered 2012-09-24: 10 mg via INTRAVENOUS

## 2012-09-24 MED ORDER — FENTANYL CITRATE 0.05 MG/ML IJ SOLN
INTRAMUSCULAR | Status: DC | PRN
  Administered 2012-09-24 (×2): 50 ug via INTRAVENOUS
  Administered 2012-09-24: 150 ug via INTRAVENOUS

## 2012-09-24 MED ORDER — HYDROMORPHONE HCL PF 1 MG/ML IJ SOLN
0.2500 mg | INTRAMUSCULAR | Status: DC | PRN
  Administered 2012-09-24 (×2): 0.5 mg via INTRAVENOUS

## 2012-09-24 MED ORDER — IOHEXOL 300 MG/ML  SOLN
100.0000 mL | Freq: Once | INTRAMUSCULAR | Status: AC | PRN
  Administered 2012-09-24: 100 mL via INTRAVENOUS

## 2012-09-24 MED ORDER — SODIUM CHLORIDE 0.9 % IR SOLN
Status: DC | PRN
  Administered 2012-09-24: 1

## 2012-09-24 MED ORDER — NEOSTIGMINE METHYLSULFATE 1 MG/ML IJ SOLN
INTRAMUSCULAR | Status: DC | PRN
  Administered 2012-09-24: 3 mg via INTRAVENOUS

## 2012-09-24 MED ORDER — DIPHENHYDRAMINE HCL 12.5 MG/5ML PO ELIX: 12.5000 mg | ORAL_SOLUTION | Freq: Four times a day (QID) | ORAL | Status: DC | PRN

## 2012-09-24 MED ORDER — ENOXAPARIN SODIUM 40 MG/0.4ML ~~LOC~~ SOLN
40.0000 mg | SUBCUTANEOUS | Status: DC
  Administered 2012-09-25: 40 mg via SUBCUTANEOUS
  Filled 2012-09-24 (×2): qty 0.4

## 2012-09-24 MED ORDER — PROPOFOL 10 MG/ML IV BOLUS
INTRAVENOUS | Status: DC | PRN
  Administered 2012-09-24: 200 mg via INTRAVENOUS

## 2012-09-24 MED ORDER — DEXTROSE IN LACTATED RINGERS 5 % IV SOLN
INTRAVENOUS | Status: DC
  Administered 2012-09-24 – 2012-09-25 (×2): via INTRAVENOUS

## 2012-09-24 MED ORDER — BUPIVACAINE-EPINEPHRINE 0.25% -1:200000 IJ SOLN
INTRAMUSCULAR | Status: DC | PRN
  Administered 2012-09-24: 15 mL

## 2012-09-24 MED ORDER — OXYCODONE HCL 5 MG/5ML PO SOLN: 5.0000 mg | Freq: Once | ORAL | Status: DC | PRN

## 2012-09-24 MED ORDER — ONDANSETRON HCL 4 MG/2ML IJ SOLN
INTRAMUSCULAR | Status: AC
  Administered 2012-09-24: 4 mg
  Filled 2012-09-24: qty 2

## 2012-09-24 MED ORDER — SODIUM CHLORIDE 0.9 % IV BOLUS (SEPSIS)
1000.0000 mL | Freq: Once | INTRAVENOUS | Status: AC
  Administered 2012-09-24: 1000 mL via INTRAVENOUS

## 2012-09-24 MED ORDER — LIDOCAINE HCL 4 % MT SOLN
OROMUCOSAL | Status: DC | PRN
  Administered 2012-09-24: 4 mL via TOPICAL

## 2012-09-24 SURGICAL SUPPLY — 42 items
APPLIER CLIP ROT 10 11.4 M/L (STAPLE)
BLADE SURG ROTATE 9660 (MISCELLANEOUS) IMPLANT
CANISTER SUCTION 2500CC (MISCELLANEOUS) ×2 IMPLANT
CHLORAPREP W/TINT 26ML (MISCELLANEOUS) ×2 IMPLANT
CLIP APPLIE ROT 10 11.4 M/L (STAPLE) IMPLANT
CLOTH BEACON ORANGE TIMEOUT ST (SAFETY) ×2 IMPLANT
CLSR STERI-STRIP ANTIMIC 1/2X4 (GAUZE/BANDAGES/DRESSINGS) ×2 IMPLANT
COVER SURGICAL LIGHT HANDLE (MISCELLANEOUS) ×2 IMPLANT
CUTTER LINEAR ENDO 35 ETS (STAPLE) IMPLANT
CUTTER LINEAR ENDO 35 ETS TH (STAPLE) IMPLANT
DECANTER SPIKE VIAL GLASS SM (MISCELLANEOUS) ×2 IMPLANT
DERMABOND ADVANCED (GAUZE/BANDAGES/DRESSINGS) ×1
DERMABOND ADVANCED .7 DNX12 (GAUZE/BANDAGES/DRESSINGS) ×1 IMPLANT
DRAPE UTILITY 15X26 W/TAPE STR (DRAPE) ×4 IMPLANT
DRSG TEGADERM 2-3/8X2-3/4 SM (GAUZE/BANDAGES/DRESSINGS) ×2 IMPLANT
ELECT REM PT RETURN 9FT ADLT (ELECTROSURGICAL) ×2
ELECTRODE REM PT RTRN 9FT ADLT (ELECTROSURGICAL) ×1 IMPLANT
ENDOLOOP SUT PDS II  0 18 (SUTURE)
ENDOLOOP SUT PDS II 0 18 (SUTURE) IMPLANT
GLOVE BIOGEL PI IND STRL 8 (GLOVE) ×1 IMPLANT
GLOVE BIOGEL PI INDICATOR 8 (GLOVE) ×1
GLOVE ECLIPSE 7.5 STRL STRAW (GLOVE) ×2 IMPLANT
GOWN STRL NON-REIN LRG LVL3 (GOWN DISPOSABLE) ×4 IMPLANT
KIT BASIN OR (CUSTOM PROCEDURE TRAY) ×2 IMPLANT
KIT ROOM TURNOVER OR (KITS) ×2 IMPLANT
NS IRRIG 1000ML POUR BTL (IV SOLUTION) ×2 IMPLANT
PAD ARMBOARD 7.5X6 YLW CONV (MISCELLANEOUS) ×4 IMPLANT
PENCIL BUTTON HOLSTER BLD 10FT (ELECTRODE) IMPLANT
POUCH SPECIMEN RETRIEVAL 10MM (ENDOMECHANICALS) ×2 IMPLANT
RELOAD /EVU35 (ENDOMECHANICALS) IMPLANT
RELOAD CUTTER ETS 35MM STAND (ENDOMECHANICALS) IMPLANT
SET IRRIG TUBING LAPAROSCOPIC (IRRIGATION / IRRIGATOR) ×2 IMPLANT
SPECIMEN JAR SMALL (MISCELLANEOUS) ×2 IMPLANT
SUT MNCRL AB 4-0 PS2 18 (SUTURE) ×2 IMPLANT
TOWEL OR 17X24 6PK STRL BLUE (TOWEL DISPOSABLE) ×2 IMPLANT
TOWEL OR 17X26 10 PK STRL BLUE (TOWEL DISPOSABLE) ×2 IMPLANT
TRAY FOLEY CATH 16FRSI W/METER (SET/KITS/TRAYS/PACK) ×2 IMPLANT
TRAY LAPAROSCOPIC (CUSTOM PROCEDURE TRAY) ×2 IMPLANT
TROCAR XCEL 12X100 BLDLESS (ENDOMECHANICALS) ×2 IMPLANT
TROCAR XCEL BLUNT TIP 100MML (ENDOMECHANICALS) ×2 IMPLANT
TROCAR XCEL NON-BLD 5MMX100MML (ENDOMECHANICALS) ×2 IMPLANT
WATER STERILE IRR 1000ML POUR (IV SOLUTION) IMPLANT

## 2012-09-24 NOTE — Progress Notes (Signed)
Pt nauseated with vomiting, zofran given per order

## 2012-09-24 NOTE — Preoperative (Signed)
Beta Blockers   Reason not to administer Beta Blockers:Not Applicable 

## 2012-09-24 NOTE — Anesthesia Preprocedure Evaluation (Addendum)
Anesthesia Evaluation  Patient identified by MRN, date of birth, ID band Patient awake    Reviewed: Allergy & Precautions, H&P , NPO status , Patient's Chart, lab work & pertinent test results  Airway Mallampati: II TM Distance: >3 FB Neck ROM: Full    Dental  (+) Teeth Intact and Dental Advisory Given   Pulmonary  breath sounds clear to auscultation        Cardiovascular Rhythm:Regular Rate:Normal     Neuro/Psych Bipolar Disorder    GI/Hepatic   Endo/Other    Renal/GU      Musculoskeletal   Abdominal   Peds  Hematology   Anesthesia Other Findings Communicated with pt through interpretation service by phone.  Some vomiting associated with illness in past 24 hours.  Reproductive/Obstetrics                          Anesthesia Physical Anesthesia Plan  ASA: II  Anesthesia Plan: General   Post-op Pain Management:    Induction: Intravenous and Rapid sequence  Airway Management Planned: Oral ETT  Additional Equipment:   Intra-op Plan:   Post-operative Plan: Extubation in OR  Informed Consent: I have reviewed the patients History and Physical, chart, labs and discussed the procedure including the risks, benefits and alternatives for the proposed anesthesia with the patient or authorized representative who has indicated his/her understanding and acceptance.   Dental advisory given  Plan Discussed with: CRNA, Anesthesiologist and Surgeon  Anesthesia Plan Comments:        Anesthesia Quick Evaluation

## 2012-09-24 NOTE — ED Notes (Signed)
She was just sen here last pm for anxiety

## 2012-09-24 NOTE — Progress Notes (Signed)
Dr. Ivin Booty called, told of patients nausea, phenergan ordered for nausea and vomiting

## 2012-09-24 NOTE — Anesthesia Procedure Notes (Signed)
Procedure Name: Intubation Date/Time: 09/24/2012 11:21 AM Performed by: Sharlene Dory E Pre-anesthesia Checklist: Patient identified, Emergency Drugs available, Suction available, Patient being monitored and Timeout performed Patient Re-evaluated:Patient Re-evaluated prior to inductionOxygen Delivery Method: Circle system utilized Preoxygenation: Pre-oxygenation with 100% oxygen Intubation Type: Rapid sequence Laryngoscope Size: Mac and 3 Grade View: Grade I Tube type: Oral Tube size: 7.5 mm Number of attempts: 1 Airway Equipment and Method: Stylet and LTA kit utilized Placement Confirmation: ETT inserted through vocal cords under direct vision,  positive ETCO2 and breath sounds checked- equal and bilateral Secured at: 22 cm Tube secured with: Tape Dental Injury: Teeth and Oropharynx as per pre-operative assessment

## 2012-09-24 NOTE — Progress Notes (Signed)
Second dose of phenergan given per Dr. Ivin Booty

## 2012-09-24 NOTE — ED Notes (Signed)
Called Ct to make them aware that pt has finished cup of contrast.

## 2012-09-24 NOTE — Transfer of Care (Signed)
Immediate Anesthesia Transfer of Care Note  Patient: Vanessa Harrison  Procedure(s) Performed: Procedure(s): APPENDECTOMY LAPAROSCOPIC (N/A)  Patient Location: PACU  Anesthesia Type:General  Level of Consciousness: sedated  Airway & Oxygen Therapy: Patient Spontanous Breathing and Patient connected to nasal cannula oxygen  Post-op Assessment: Report given to PACU RN, Post -op Vital signs reviewed and stable and Patient moving all extremities  Post vital signs: Reviewed and stable  Complications: No apparent anesthesia complications

## 2012-09-24 NOTE — H&P (Signed)
Vanessa Harrison is an 37 y.o. female.   Chief Complaint: abdominal pain, nausea vomiting HPI: the patient is a 37 year old female with approximately 24-hour history of abdominal pain. Patient states the pain is localized to the periumbilical area. She's had some accompanying nausea and vomiting during this time. She states is no pain in right lower quadrant this time. Patient is an ER with a CT scan. Her CT scan patient has a minimally dilated appendix with some periappendiceal stranding likely consistent with early appendicitis.  Past Medical History  Diagnosis Date  . Hypercholesteremia   . Anxiety   . Bipolar 2 disorder     Past Surgical History  Procedure Laterality Date  . Cesarean section      Family History  Problem Relation Age of Onset  . Diabetes Mother   . Hypertension Mother   . Diabetes Sister   . Hypertension Sister   . Cancer Sister 37    breast  . Hypertension Brother    Social History:  reports that she has never smoked. She has never used smokeless tobacco. She reports that she does not drink alcohol or use illicit drugs.  Allergies: No Known Allergies   (Not in a hospital admission)  Results for orders placed during the hospital encounter of 09/24/12 (from the past 48 hour(s))  CBC WITH DIFFERENTIAL     Status: Abnormal   Collection Time    09/24/12  3:03 AM      Result Value Range   WBC 17.2 (*) 4.0 - 10.5 K/uL   RBC 4.16  3.87 - 5.11 MIL/uL   Hemoglobin 13.0  12.0 - 15.0 g/dL   HCT 86.5  78.4 - 69.6 %   MCV 87.3  78.0 - 100.0 fL   MCH 31.3  26.0 - 34.0 pg   MCHC 35.8  30.0 - 36.0 g/dL   RDW 29.5  28.4 - 13.2 %   Platelets 312  150 - 400 K/uL   Neutrophils Relative % 81 (*) 43 - 77 %   Neutro Abs 13.9 (*) 1.7 - 7.7 K/uL   Lymphocytes Relative 14  12 - 46 %   Lymphs Abs 2.5  0.7 - 4.0 K/uL   Monocytes Relative 4  3 - 12 %   Monocytes Absolute 0.7  0.1 - 1.0 K/uL   Eosinophils Relative 1  0 - 5 %   Eosinophils Absolute 0.1  0.0 - 0.7 K/uL    Basophils Relative 0  0 - 1 %   Basophils Absolute 0.0  0.0 - 0.1 K/uL  COMPREHENSIVE METABOLIC PANEL     Status: Abnormal   Collection Time    09/24/12  3:03 AM      Result Value Range   Sodium 137  135 - 145 mEq/L   Potassium 4.0  3.5 - 5.1 mEq/L   Chloride 104  96 - 112 mEq/L   CO2 22  19 - 32 mEq/L   Glucose, Bld 119 (*) 70 - 99 mg/dL   BUN 10  6 - 23 mg/dL   Creatinine, Ser 4.40  0.50 - 1.10 mg/dL   Calcium 9.8  8.4 - 10.2 mg/dL   Total Protein 7.9  6.0 - 8.3 g/dL   Albumin 4.0  3.5 - 5.2 g/dL   AST 16  0 - 37 U/L   ALT 23  0 - 35 U/L   Alkaline Phosphatase 89  39 - 117 U/L   Total Bilirubin 0.4  0.3 - 1.2 mg/dL   GFR  calc non Af Amer >90  >90 mL/min   GFR calc Af Amer >90  >90 mL/min   Comment: (NOTE)     The eGFR has been calculated using the CKD EPI equation.     This calculation has not been validated in all clinical situations.     eGFR's persistently <90 mL/min signify possible Chronic Kidney     Disease.  LIPASE, BLOOD     Status: None   Collection Time    09/24/12  3:03 AM      Result Value Range   Lipase 18  11 - 59 U/L  LITHIUM LEVEL     Status: Abnormal   Collection Time    09/24/12  3:40 AM      Result Value Range   Lithium Lvl 0.62 (*) 0.80 - 1.40 mEq/L  URINALYSIS, ROUTINE W REFLEX MICROSCOPIC     Status: Abnormal   Collection Time    09/24/12  4:13 AM      Result Value Range   Color, Urine YELLOW  YELLOW   APPearance CLOUDY (*) CLEAR   Specific Gravity, Urine 1.023  1.005 - 1.030   pH 7.0  5.0 - 8.0   Glucose, UA NEGATIVE  NEGATIVE mg/dL   Hgb urine dipstick NEGATIVE  NEGATIVE   Bilirubin Urine NEGATIVE  NEGATIVE   Ketones, ur NEGATIVE  NEGATIVE mg/dL   Protein, ur 30 (*) NEGATIVE mg/dL   Urobilinogen, UA 1.0  0.0 - 1.0 mg/dL   Nitrite NEGATIVE  NEGATIVE   Leukocytes, UA SMALL (*) NEGATIVE  PREGNANCY, URINE     Status: None   Collection Time    09/24/12  4:13 AM      Result Value Range   Preg Test, Ur NEGATIVE  NEGATIVE   Comment:             THE SENSITIVITY OF THIS     METHODOLOGY IS >20 mIU/mL.  URINE RAPID DRUG SCREEN (HOSP PERFORMED)     Status: None   Collection Time    09/24/12  4:13 AM      Result Value Range   Opiates NONE DETECTED  NONE DETECTED   Cocaine NONE DETECTED  NONE DETECTED   Benzodiazepines NONE DETECTED  NONE DETECTED   Amphetamines NONE DETECTED  NONE DETECTED   Tetrahydrocannabinol NONE DETECTED  NONE DETECTED   Barbiturates NONE DETECTED  NONE DETECTED   Comment:            DRUG SCREEN FOR MEDICAL PURPOSES     ONLY.  IF CONFIRMATION IS NEEDED     FOR ANY PURPOSE, NOTIFY LAB     WITHIN 5 DAYS.                LOWEST DETECTABLE LIMITS     FOR URINE DRUG SCREEN     Drug Class       Cutoff (ng/mL)     Amphetamine      1000     Barbiturate      200     Benzodiazepine   200     Tricyclics       300     Opiates          300     Cocaine          300     THC              50  URINE MICROSCOPIC-ADD ON     Status: Abnormal   Collection Time  09/24/12  4:13 AM      Result Value Range   Squamous Epithelial / LPF MANY (*) RARE   WBC, UA 3-6  <3 WBC/hpf   RBC / HPF 0-2  <3 RBC/hpf   Bacteria, UA MANY (*) RARE   Casts HYALINE CASTS (*) NEGATIVE   Ct Soft Tissue Neck W Contrast  09/23/2012   *RADIOLOGY REPORT*  Clinical Data: Dysphasia.  CT NECK WITH CONTRAST  Technique:  Multidetector CT imaging of the neck was performed with intravenous contrast.  Contrast: 75mL OMNIPAQUE IOHEXOL 300 MG/ML  SOLN  Comparison: None.  Findings: No evidence of mass, edema, of other significant abnormality involving the aerodigestive tract.  The palatine tonsils are mildly enlarged for age, and may reflect a tonsillitis. No abscess or deep space inflammation.  Mild enlargement of submental lymph nodes, possibly reactive to the above.  The major vascular structures, salivary glands, and upper lungs are unremarkable. Possible disc herniation at C5-6, which would contact the ventral cord. Certainty limited by CT.  Minimal,  ill-defined low attenuation in the left lobe thyroid gland, likely incidental.  IMPRESSION: Negative CT of the neck. No mass or pharyngeal edema.   Original Report Authenticated By: Tiburcio Pea   Ct Abdomen Pelvis W Contrast  09/24/2012   *RADIOLOGY REPORT*  Clinical Data: Emesis  CT ABDOMEN AND PELVIS WITH CONTRAST  Technique:  Multidetector CT imaging of the abdomen and pelvis was performed following the standard protocol during bolus administration of intravenous contrast.  Contrast: OMNIPAQUE IOHEXOL 300 MG/ML  SOLN  Comparison: Prior CT from 03/02/2011  Findings: Dependent subsegmental atelectasis is present within both lung bases.  Visualized heart is normal.  Trace pericardial fluid is noted.  Diffuse hypo attenuation of the liver is suggestive of steatosis. The liver is otherwise unremarkable.  The gallbladder, spleen, adrenal glands, and pancreas demonstrate normal contrast enhanced appearance.  The kidneys demonstrate symmetric enhancement without evidence of mass lesion or nephrolithiasis.  There is no hydronephrosis. The mid- distal right ureter is mildly prominent measuring up to 7 - 8 mm in diameter over a short segment, of uncertain clinical significance.  The stomach is unremarkable.  There is no evidence of bowel obstruction.  The appendix is mildly prominent measuring 6 mm in diameter with minimal associated periappendiceal fat stranding (series 3, image 66).  Findings are indeterminate, but may represent an early/indolent appendicitis.  The visualized bowel are otherwise unremarkable.  The bladder is unremarkable. Uterus is normal. Hypodense cystic lesion measuring 2.5 x 2.0 cm with hyperintense rim located within the left ovary is most consistent with a corpus luteal cyst. The right ovary is normal.  Trace free fluid is present within the pelvis, likely physiologic.  There is no free intraperitoneal air.  No pathologically enlarged intra-abdominal pelvic lymph nodes are seen.  The  osseous structures are within normal limits.  IMPRESSION:  1.  Mildly prominent appendix measuring 6 mm in diameter with associated subtle inflammatory fat stranding.  These findings are suggestive of a possible early/indolent appendicitis.  Correlation with history and physical exam is recommended. 2.  No other CT evidence of acute intra-abdominal pelvic pathology.  Critical Value/emergent results were called by telephone at the time of interpretation on 09/24/2012 at 06:24 a.m. to Dr. Glynn Octave, who verbally acknowledged these results.   Original Report Authenticated By: Rise Mu, M.D.    Review of Systems  Constitutional: Negative.   HENT: Negative.   Respiratory: Negative.   Cardiovascular: Negative.   Gastrointestinal: Positive  for nausea and vomiting.  Musculoskeletal: Negative.   Skin: Negative.   Neurological: Negative.   All other systems reviewed and are negative.    Blood pressure 121/76, pulse 101, temperature 98.7 F (37.1 C), temperature source Oral, resp. rate 18, last menstrual period 08/23/2012, SpO2 99.00%. Physical Exam  Constitutional: She is oriented to person, place, and time. She appears well-developed and well-nourished.  HENT:  Head: Normocephalic and atraumatic.  Eyes: Conjunctivae and EOM are normal. Pupils are equal, round, and reactive to light.  Neck: Normal range of motion. Neck supple.  Cardiovascular: Normal rate, regular rhythm and normal heart sounds.   Respiratory: Effort normal and breath sounds normal.  GI: Soft. Bowel sounds are normal. There is tenderness (at epigastrum). There is no rebound and no guarding.  Musculoskeletal: Normal range of motion.  Neurological: She is alert and oriented to person, place, and time.  Skin: Skin is warm and dry.     Assessment/Plan 37 year old female with early appendicitis. 1. The patient will be admitted. 2. Consent for a lap appendectomy. 3. We'll start antibiotics keep the patient  n.p.o.  Marigene Ehlers., Berneice Zettlemoyer 09/24/2012, 7:10 AM

## 2012-09-24 NOTE — Anesthesia Postprocedure Evaluation (Signed)
  Anesthesia Post-op Note  Patient: Vanessa Harrison  Procedure(s) Performed: Procedure(s): APPENDECTOMY LAPAROSCOPIC (N/A)  Patient Location: PACU  Anesthesia Type:General  Level of Consciousness: awake, alert  and oriented  Airway and Oxygen Therapy: Patient Spontanous Breathing  Post-op Pain: mild  Post-op Assessment: Post-op Vital signs reviewed  Post-op Vital Signs: Reviewed  Complications: No apparent anesthesia complications

## 2012-09-24 NOTE — ED Provider Notes (Signed)
CSN: [id number]     Arrival date & time 09/24/12  0215 History     First MD Initiated Contact with Patient 09/24/12 0255     Chief Complaint  Patient presents with  . Emesis   (Consider location/radiation/quality/duration/timing/severity/associated sxs/prior Treatment) HPI Comments: History obtained through Spanish interpreter [phone]. Patient presents with nausea and vomiting it started at 8:30 PM. She about 4 times. Denies seeing any blood in her emesis. She denies any abdominal pain or diarrhea or fever. She was seen in ED last night for dysphagia does to anxiety. She continues to feel very anxious and her daughter stated that the patient was having some thoughts of hurting herself at home. Through the patient interpreter, the patient states that she thought about hurting herself with a knife but no longer feels that way. She denies any current suicidal or homicidal thoughts. She denies any hallucinations or hearing voices. She states compliance with her medications.  The history is provided by the patient and a relative. The history is limited by a language barrier. A language interpreter was used.    Past Medical History  Diagnosis Date  . Hypercholesteremia   . Anxiety   . Bipolar 2 disorder    Past Surgical History  Procedure Laterality Date  . Cesarean section     Family History  Problem Relation Age of Onset  . Diabetes Mother   . Hypertension Mother   . Diabetes Sister   . Hypertension Sister   . Cancer Sister 35    breast  . Hypertension Brother    History  Substance Use Topics  . Smoking status: Never Smoker   . Smokeless tobacco: Never Used  . Alcohol Use: No   OB History   Grav Para Term Preterm Abortions TAB SAB Ect Mult Living   4 1   3     1      Review of Systems  Constitutional: Positive for appetite change. Negative for fever, activity change and fatigue.  HENT: Negative for congestion and rhinorrhea.   Respiratory: Negative for cough, chest  tightness and shortness of breath.   Cardiovascular: Negative for chest pain.  Gastrointestinal: Positive for nausea and vomiting. Negative for abdominal pain and diarrhea.  Genitourinary: Negative for dysuria, hematuria, vaginal bleeding and vaginal discharge.  Musculoskeletal: Negative for back pain.  Skin: Negative for rash.  Neurological: Positive for weakness. Negative for dizziness and headaches.  Psychiatric/Behavioral: Positive for suicidal ideas. The patient is nervous/anxious.   A complete 10 system review of systems was obtained and all systems are negative except as noted in the HPI and PMH.    Allergies  Review of patient's allergies indicates no known allergies.  Home Medications   Current Outpatient Rx  Name  Route  Sig  Dispense  Refill  . clonazePAM (KLONOPIN) 0.5 MG tablet   Oral   Take 0.5 mg by mouth at bedtime as needed for anxiety.          . haloperidol (HALDOL) 2 MG tablet   Oral   Take 2 mg by mouth at bedtime.         Marland Kitchen lithium carbonate 300 MG capsule   Oral   Take 600 mg by mouth 2 (two) times daily with a meal.          BP 121/76  Pulse 86  Temp(Src) 98.7 F (37.1 C) (Oral)  Resp 18  SpO2 93%  LMP 08/23/2012 Physical Exam  Constitutional: She is oriented to person, place, and time.  She appears well-developed and well-nourished. No distress.  HENT:  Head: Normocephalic and atraumatic.  Mouth/Throat: Oropharynx is clear and moist. No oropharyngeal exudate.  Oropharynx clear, no assymtery.  Eyes: Conjunctivae and EOM are normal. Pupils are equal, round, and reactive to light.  Neck: Normal range of motion. Neck supple.  Cardiovascular: Normal rate, regular rhythm and normal heart sounds.   No murmur heard. Pulmonary/Chest: Effort normal and breath sounds normal. No respiratory distress.  Abdominal: Soft. There is tenderness. There is no rebound and no guarding.  Mild periumbilical pain  Musculoskeletal: Normal range of motion. She  exhibits no edema and no tenderness.  Neurological: She is alert and oriented to person, place, and time. No cranial nerve deficit. She exhibits normal muscle tone. Coordination normal.  Skin: Skin is warm.    ED Course   Procedures (including critical care time)  Labs Reviewed  CBC WITH DIFFERENTIAL - Abnormal; Notable for the following:    WBC 17.2 (*)    Neutrophils Relative % 81 (*)    Neutro Abs 13.9 (*)    All other components within normal limits  COMPREHENSIVE METABOLIC PANEL - Abnormal; Notable for the following:    Glucose, Bld 119 (*)    All other components within normal limits  URINALYSIS, ROUTINE W REFLEX MICROSCOPIC - Abnormal; Notable for the following:    APPearance CLOUDY (*)    Protein, ur 30 (*)    Leukocytes, UA SMALL (*)    All other components within normal limits  LITHIUM LEVEL - Abnormal; Notable for the following:    Lithium Lvl 0.62 (*)    All other components within normal limits  URINE MICROSCOPIC-ADD ON - Abnormal; Notable for the following:    Squamous Epithelial / LPF MANY (*)    Bacteria, UA MANY (*)    Casts HYALINE CASTS (*)    All other components within normal limits  URINE CULTURE  LIPASE, BLOOD  PREGNANCY, URINE  URINE RAPID DRUG SCREEN (HOSP PERFORMED)   Ct Soft Tissue Neck W Contrast  09/23/2012   *RADIOLOGY REPORT*  Clinical Data: Dysphasia.  CT NECK WITH CONTRAST  Technique:  Multidetector CT imaging of the neck was performed with intravenous contrast.  Contrast: 75mL OMNIPAQUE IOHEXOL 300 MG/ML  SOLN  Comparison: None.  Findings: No evidence of mass, edema, of other significant abnormality involving the aerodigestive tract.  The palatine tonsils are mildly enlarged for age, and may reflect a tonsillitis. No abscess or deep space inflammation.  Mild enlargement of submental lymph nodes, possibly reactive to the above.  The major vascular structures, salivary glands, and upper lungs are unremarkable. Possible disc herniation at C5-6,  which would contact the ventral cord. Certainty limited by CT.  Minimal, ill-defined low attenuation in the left lobe thyroid gland, likely incidental.  IMPRESSION: Negative CT of the neck. No mass or pharyngeal edema.   Original Report Authenticated By: Tiburcio Pea   Ct Abdomen Pelvis W Contrast  09/24/2012   *RADIOLOGY REPORT*  Clinical Data: Emesis  CT ABDOMEN AND PELVIS WITH CONTRAST  Technique:  Multidetector CT imaging of the abdomen and pelvis was performed following the standard protocol during bolus administration of intravenous contrast.  Contrast: OMNIPAQUE IOHEXOL 300 MG/ML  SOLN  Comparison: Prior CT from 03/02/2011  Findings: Dependent subsegmental atelectasis is present within both lung bases.  Visualized heart is normal.  Trace pericardial fluid is noted.  Diffuse hypo attenuation of the liver is suggestive of steatosis. The liver is otherwise unremarkable.  The  gallbladder, spleen, adrenal glands, and pancreas demonstrate normal contrast enhanced appearance.  The kidneys demonstrate symmetric enhancement without evidence of mass lesion or nephrolithiasis.  There is no hydronephrosis. The mid- distal right ureter is mildly prominent measuring up to 7 - 8 mm in diameter over a short segment, of uncertain clinical significance.  The stomach is unremarkable.  There is no evidence of bowel obstruction.  The appendix is mildly prominent measuring 6 mm in diameter with minimal associated periappendiceal fat stranding (series 3, image 66).  Findings are indeterminate, but may represent an early/indolent appendicitis.  The visualized bowel are otherwise unremarkable.  The bladder is unremarkable. Uterus is normal. Hypodense cystic lesion measuring 2.5 x 2.0 cm with hyperintense rim located within the left ovary is most consistent with a corpus luteal cyst. The right ovary is normal.  Trace free fluid is present within the pelvis, likely physiologic.  There is no free intraperitoneal air.  No  pathologically enlarged intra-abdominal pelvic lymph nodes are seen.  The osseous structures are within normal limits.  IMPRESSION:  1.  Mildly prominent appendix measuring 6 mm in diameter with associated subtle inflammatory fat stranding.  These findings are suggestive of a possible early/indolent appendicitis.  Correlation with history and physical exam is recommended. 2.  No other CT evidence of acute intra-abdominal pelvic pathology.  Critical Value/emergent results were called by telephone at the time of interpretation on 09/24/2012 at 06:24 a.m. to Dr. Glynn Octave, who verbally acknowledged these results.   Original Report Authenticated By: Rise Mu, M.D.   1. Appendicitis     MDM  Nausea, vomiting since 8:30 PM about 5 episodes. Mild periumbilical pain. No fever. Seen yesterday for dysphasia attributed to anxiety.  Through translator, patient denies any suicidal ideation. Her daughter states at home patient was talking about wanting to hurt herself with a knife. Will obtain psychiatry consult.  Patient has worsening leukocytosis. Given her vomiting, will obtain imaging. CT scan remarkable for possible early appendicitis.  D/w Dr. Ramirez./ He plans to take patient to the OR.  He is aware of her psychiatric issues. She denies any ongoing suicidal or homicidal thoughts.  Glynn Octave, MD 09/24/12 [phone]

## 2012-09-24 NOTE — BH Assessment (Signed)
[facility] Assessment Progress Note  Update:  Called EDP [facility] in reference to pt's tele assessment order, as there is a note that pt is going into surgery.  Tele assessment cancelled at this time due to pt going to OR.  This clinician also contacted an interpreter Nurse, [facility] [employer] [phone]) to request an interpreter and this was also cancelled at this time as pt going into surgery and not medically cleared.  Updated [facility] and [facility] staff.

## 2012-09-24 NOTE — Op Note (Signed)
OPERATIVE REPORT  DATE OF OPERATION: 09/24/2012  PATIENT:  Vanessa Harrison  37 y.o. female  PRE-OPERATIVE DIAGNOSIS:  Early appendicitis  POST-OPERATIVE DIAGNOSIS:  Very early appendicitis/Adhesions  PROCEDURE:  Procedure(s): APPENDECTOMY LAPAROSCOPIC Laparoscopic enterolysis  SURGEON:  Surgeon(s): Cherylynn Ridges, MD  ASSISTANT: None  ANESTHESIA:   general  EBL: <20 ml  BLOOD ADMINISTERED: none  DRAINS: none   SPECIMEN:  Source of Specimen:  appendix  COUNTS CORRECT:  YES  PROCEDURE DETAILS: The patient was taken to the operating room and placed on the table in the supine position. After an adequate general endotracheal anesthetic was administered she was prepped and draped in the usual sterile manner exposing her entire abdomen.  After proper time out was performed identifying the patient and the procedure to be performed, a supraumbilical midline incision was made using a #15 blade and taken down to the midline fascia. The midline fascia was tented up and we incised in between the using a 15 blade into the pre-peroneal space. We bluntly dissected down into the peritoneal cavity. A pursestring suture of 0 Vicryl was passed around the fascial opening. A Hassan cannula was passed into the peritoneal cavity and secured in place with the pursestring suture. Once this was done carbon dioxide gas was insufflated into the peritoneal cavity up to a maximal intra-abdominal pressure of 15 mm mercury.  A right upper quadrant 5 mm cannula in the left lower quadrant 12 mm cannula were passed under direct vision. The patient was placed in Trendelenburg in the left side was tilted down.  There were omental adhesions to the anterior abdominal wall in the pelvis which took approximately 45 minutes to take down using laparoscopic scissors and electrocautery. Once this was done the barely inflamed, it at all inflamed, appendix was mobilized at the base of the cecum and subsequently transected at  the base using a blue Endo GIA stapler. We subsequently came across the mesoappendix using a white cartridge Endo GIA. We removed the appendix from the left lower quadrant site. We used electrocautery and irrigation to assess and control bleeding in the right lower quadrant.  Once this was done we aspirated all fluid and gas in the peritoneal cavity and closed.  The supraumbilical fascia site was closed using the pursestring suture which was in place. We subsequently injected Marcaine at all cannula sites. We closed the supraumbilical and left lower quadrant skin sites using a running 4-0 Monocryl. Dermabond Steri-Strips and Tegaderms use complete all dressings all needle counts, sponge counts and instrument counts were correct.  PATIENT DISPOSITION:  PACU - hemodynamically stable.   Cherylynn Ridges 8/16/201412:34 PM

## 2012-09-25 LAB — URINE CULTURE

## 2012-09-25 MED ORDER — HYDROCODONE-ACETAMINOPHEN 5-325 MG PO TABS: 1.0000 | ORAL_TABLET | ORAL | Status: DC | PRN

## 2012-09-25 NOTE — Discharge Summary (Signed)
  Patient ID: Vanessa Harrison [id number] 37 y.o. 1975/05/28  Admit date: 09/24/2012  Discharge date and time: No discharge date for patient encounter.  Admitting Physician: Ernestene Mention  Discharge Physician: Ernestene Mention  Admission Diagnoses: Appendicitis [541]  Discharge Diagnoses: appendicitis. Bipolar disorder Hypercholesterolemia History cesarean section   Operations: Procedure(s): APPENDECTOMY LAPAROSCOPIC  Admission Condition: fair  Discharged Condition: good  Indication for Admission: the patient is a 37 year old female with approximately 24-hour history of abdominal pain. Patient states the pain is localized to the periumbilical area. She's had some accompanying nausea and vomiting during this time. She states is no pain in right lower quadrant this time. Patient is an ER with a CT scan. Her CT scan patient has a minimally dilated appendix with some periappendiceal stranding likely consistent with early appendicitis.   Hospital Course: the patient was evaluated in the emergency department, and was then taken to the operating room and underwent a laparoscopic appendectomy by Dr. Lytle Michaels. It. The appendix was minimally inflamed. Postoperatively she did well. She progressed in diet and activities. On the morning following surgery she was alert. She was tolerating some food, ambulating to the bathroom and voiding. She wanted to go home. Her abdominal exam was benign. Her wounds looked good. Diet and activities were discussed with the patient and her husband. The husband primarily translated for this conversation. She was given a prescription for Vicodin for pain. She was asked to make an appointment in the doctor of the week clinic in 3 weeks.  Consults: None  Significant Diagnostic Studies: CT abdomen, surgical pathology (pending)  Treatments: surgery: laparoscopic appendectomy  Disposition: Home  Patient Instructions:    Medication List         clonazePAM 0.5  MG tablet  Commonly known as:  KLONOPIN  Take 0.5 mg by mouth at bedtime as needed for anxiety.     haloperidol 2 MG tablet  Commonly known as:  HALDOL  Take 2 mg by mouth at bedtime.     HYDROcodone-acetaminophen 5-325 MG per tablet  Commonly known as:  NORCO/VICODIN  Take 1-2 tablets by mouth every 4 (four) hours as needed for pain.     lithium carbonate 300 MG capsule  Take 600 mg by mouth 2 (two) times daily with a meal.        Activity: activity as tolerated Diet: low fat, low cholesterol diet Wound Care: none needed  Follow-up:  With [facility] in 3 weeks.  Signed: Angelia Mould. Derrell Lolling, M.D., FACS General and minimally invasive surgery Breast and Colorectal Surgery  09/25/2012, 9:20 AM

## 2012-09-26 ENCOUNTER — Encounter (HOSPITAL_COMMUNITY): Payer: Self-pay | Admitting: General Surgery

## 2012-10-18 ENCOUNTER — Ambulatory Visit (INDEPENDENT_AMBULATORY_CARE_PROVIDER_SITE_OTHER): Payer: Self-pay | Admitting: Internal Medicine

## 2012-10-18 ENCOUNTER — Encounter (INDEPENDENT_AMBULATORY_CARE_PROVIDER_SITE_OTHER): Payer: Self-pay | Admitting: Internal Medicine

## 2012-10-18 VITALS — BP 108/76 | HR 67 | Temp 96.7°F | Resp 16 | Ht 63.0 in | Wt 180.8 lb

## 2012-10-18 DIAGNOSIS — K3580 Unspecified acute appendicitis: Secondary | ICD-10-CM

## 2012-10-18 NOTE — Patient Instructions (Signed)
May resume regular activity without restrictions. Follow up as needed. Call with questions or concerns.  

## 2012-10-18 NOTE — Progress Notes (Signed)
  Subjective: Pt returns to the clinic today after undergoing laparoscopic appendectomy on 09/24/12 by Dr. Lindie Spruce.  The patient is tolerating their diet well and is having no severe pain.  Bowel function is good.  No problems with the wounds.  Objective: Vital signs in last 24 hours: Reviewed  PE: Abd: soft, non-tender, +bs, incisions well healed  Lab Results:  No results found for this basename: WBC, HGB, HCT, PLT,  in the last 72 hours BMET No results found for this basename: NA, K, CL, CO2, GLUCOSE, BUN, CREATININE, CALCIUM,  in the last 72 hours PT/INR No results found for this basename: LABPROT, INR,  in the last 72 hours CMP     Component Value Date/Time   NA 137 09/24/2012 0303   K 4.0 09/24/2012 0303   CL 104 09/24/2012 0303   CO2 22 09/24/2012 0303   GLUCOSE 119* 09/24/2012 0303   BUN 10 09/24/2012 0303   CREATININE 0.52 09/24/2012 0303   CALCIUM 9.8 09/24/2012 0303   PROT 7.9 09/24/2012 0303   ALBUMIN 4.0 09/24/2012 0303   AST 16 09/24/2012 0303   ALT 23 09/24/2012 0303   ALKPHOS 89 09/24/2012 0303   BILITOT 0.4 09/24/2012 0303   GFRNONAA >90 09/24/2012 0303   GFRAA >90 09/24/2012 0303   Lipase     Component Value Date/Time   LIPASE 18 09/24/2012 0303       Studies/Results: No results found.  Anti-infectives: Anti-infectives   None       Assessment/Plan  1.  S/P Laparoscopic Appendectomy: doing well, may resume regular activity without restrictions, Pt will follow up with [place] PRN and knows to call with questions or concerns.     Vanessa Harrison 10/18/2012

## 2012-11-04 ENCOUNTER — Encounter: Payer: Self-pay | Admitting: Emergency Medicine

## 2013-01-18 ENCOUNTER — Ambulatory Visit: Payer: Self-pay | Admitting: Family Medicine

## 2013-04-05 ENCOUNTER — Ambulatory Visit: Payer: Self-pay | Admitting: Emergency Medicine

## 2013-04-05 VITALS — BP 122/40 | HR 91 | Temp 98.6°F | Resp 16 | Ht 62.0 in | Wt 183.0 lb

## 2013-04-05 DIAGNOSIS — F411 Generalized anxiety disorder: Secondary | ICD-10-CM

## 2013-04-05 DIAGNOSIS — G4700 Insomnia, unspecified: Secondary | ICD-10-CM

## 2013-04-05 DIAGNOSIS — F419 Anxiety disorder, unspecified: Secondary | ICD-10-CM

## 2013-04-05 DIAGNOSIS — F319 Bipolar disorder, unspecified: Secondary | ICD-10-CM | POA: Insufficient documentation

## 2013-04-05 MED ORDER — TEMAZEPAM 30 MG PO CAPS: 30.0000 mg | ORAL_CAPSULE | Freq: Every evening | ORAL | Status: DC | PRN

## 2013-04-05 NOTE — Patient Instructions (Signed)

## 2013-04-05 NOTE — Progress Notes (Signed)
[facility] [address] [place] [address] [phone]  Date:  04/05/2013   Name:  Vanessa Harrison   DOB:  September 01, 1975   MRN:  [id number]  PCP:  Maryruth Eve, MD    Chief Complaint: Insomnia and Dysphagia   History of Present Illness:  Vanessa Harrison is a 38 y.o. very pleasant female patient who presents with the following:  Since appendectomy 6 months ago has experienced difficulty sleeping.  Says she lays down at 1030 and startles awake multiple times during the night.  Has no other acute complaint.  No excess caffeine or alcohol use.   Awakens at 0500 for the day.  Feels tired.  Never experienced this prior to the surgery.  No improvement with over the counter medications or other home remedies.   Patient Active Problem List   Diagnosis Date Noted  . Healthcare maintenance 04/01/2012  . Obesity (BMI 30.0-34.9) 04/01/2012    Past Medical History  Diagnosis Date  . Hypercholesteremia   . Anxiety   . Bipolar 2 disorder     Past Surgical History  Procedure Laterality Date  . Cesarean section    . Laparoscopic appendectomy N/A 09/24/2012    Procedure: APPENDECTOMY LAPAROSCOPIC;  Surgeon: Gwenyth Ober, MD;  Location: [facility];  Service: General;  Laterality: N/A;  . Appendectomy  09/2012    lap appy    History  Substance Use Topics  . Smoking status: Never Smoker   . Smokeless tobacco: Never Used  . Alcohol Use: No    Family History  Problem Relation Age of Onset  . Diabetes Mother   . Hypertension Mother   . Diabetes Sister   . Hypertension Sister   . Cancer Sister 82    breast  . Hypertension Brother     No Known Allergies  Medication list has been reviewed and updated.  Current Outpatient Prescriptions on File Prior to Visit  Medication Sig Dispense Refill  . haloperidol (HALDOL) 2 MG tablet Take 2 mg by mouth at bedtime.      Marland Kitchen lithium carbonate 300 MG capsule Take 600 mg by mouth 2 (two) times daily with a meal.      .  clonazePAM (KLONOPIN) 0.5 MG tablet Take 0.5 mg by mouth at bedtime as needed for anxiety.        No current facility-administered medications on file prior to visit.    Review of Systems:  As per HPI, otherwise negative.    Physical Examination: Filed Vitals:   04/05/13 1847  BP: 122/40  Pulse: 91  Temp: 98.6 F (37 C)  Resp: 16   Filed Vitals:   04/05/13 1847  Height: 5\' 2"  (1.575 m)  Weight: 183 lb (83.008 kg)   Body mass index is 33.46 kg/(m^2). Ideal Body Weight: Weight in (lb) to have BMI = 25: 136.4  GEN: WDWN, NAD, Non-toxic, A & O x 3 HEENT: Atraumatic, Normocephalic. Neck supple. No masses, No LAD. Ears and Nose: No external deformity. CV: RRR, No M/G/R. No JVD. No thrill. No extra heart sounds. PULM: CTA B, no wheezes, crackles, rhonchi. No retractions. No resp. distress. No accessory muscle use. ABD: S, NT, ND, +BS. No rebound. No HSM. EXTR: No c/c/e NEURO Normal gait.  PSYCH: Normally interactive. Conversant. Not depressed or anxious appearing.  Calm demeanor.    Assessment and Plan: Insomnia restoril   Signed,  Ellison Carwin, MD

## 2014-10-10 ENCOUNTER — Ambulatory Visit: Payer: Self-pay | Attending: Oncology | Admitting: *Deleted

## 2014-10-10 ENCOUNTER — Encounter: Payer: Self-pay | Admitting: *Deleted

## 2014-10-10 ENCOUNTER — Ambulatory Visit
Admission: RE | Admit: 2014-10-10 | Discharge: 2014-10-10 | Disposition: A | Discharge status: Home / Self Care | Payer: Self-pay | Source: Ambulatory Visit | Attending: Oncology | Admitting: Oncology

## 2014-10-10 VITALS — BP 110/71 | HR 70 | Temp 98.8°F | Ht 64.17 in | Wt 175.7 lb

## 2014-10-10 DIAGNOSIS — N63 Unspecified lump in unspecified breast: Secondary | ICD-10-CM

## 2014-10-10 NOTE — Progress Notes (Signed)
Subjective:     Patient ID: Vanessa Harrison, female   DOB: Jul 11, 1975, 39 y.o.   MRN: [id number]  HPI   Review of Systems     Objective:   Physical Exam  Pulmonary/Chest: Right breast exhibits no inverted nipple, no mass, no nipple discharge, no skin change and no tenderness. Left breast exhibits mass. Left breast exhibits no inverted nipple, no nipple discharge, no skin change and no tenderness. Breasts are symmetrical.         Assessment:     39 year old Hispanic female presents to [facility] via referral from [facility] for a left breast mass.  Herb Grays, the interpreter present during the interview and exam.  Patient states she saw her provider about a month ago, who felt the mass at that time.  She states her provider recommended that she get a mammogram, but she refused at the time.  States about 1 to 1 1/2 weeks ago she noticed she could feel the lump and it was getting bigger.  States she went back to her provider and they referred to [place] for further evaluation.  On clinical breast exam there is an approximate 2 cm smooth, mobile mass at 3:00 left breast, with possible lymph adenopathy.  Patients family history includes a sister with breast cancer at age 82.  Taught self breast awareness.  Patient has been screened for eligibility.  She does not have any insurance, Medicare or Medicaid.  She also meets financial eligibility.  Hand-out given on the Affordable Care Act.     Plan:     Will bilateral diagnostic mammogram with ultrasound.  If no findings on imaging will refer for surgical consultation.  Patient is agreeable to the plan.  Will follow up per protocol.

## 2014-10-10 NOTE — Progress Notes (Signed)
Called patient via Vanessa Harrison the interpreter.  Patient with birads 2 mammogram.  Explained need to follow-up with surgical consultation for the palpable mass.  Patient is scheduled to see Dr. Bary Castilla / Jamal Collin on 10/22/14 date at 1:30.  She is to arrive 30 minutes early to complete paperwork.  She is to take a photo ID and all her meds with her to the appointment.  Will follow up per protocol!

## 2014-10-10 NOTE — Patient Instructions (Signed)
Gave patient hand-out, Women Staying Healthy, Active and Well from [facility], with education on breast health, pap smears, heart and colon health. 

## 2014-10-17 ENCOUNTER — Telehealth: Payer: Self-pay | Admitting: *Deleted

## 2014-10-17 NOTE — Telephone Encounter (Signed)
Called patient via Ronnald Collum the interpreter and reminded of her appointment next week with Dr. Bary Castilla.

## 2014-10-22 ENCOUNTER — Ambulatory Visit (INDEPENDENT_AMBULATORY_CARE_PROVIDER_SITE_OTHER): Payer: PRIVATE HEALTH INSURANCE | Admitting: General Surgery

## 2014-10-22 ENCOUNTER — Encounter: Payer: Self-pay | Admitting: General Surgery

## 2014-10-22 ENCOUNTER — Other Ambulatory Visit: Payer: PRIVATE HEALTH INSURANCE

## 2014-10-22 VITALS — BP 102/70 | HR 76 | Resp 16 | Ht 64.0 in | Wt 176.0 lb

## 2014-10-22 DIAGNOSIS — N6001 Solitary cyst of right breast: Secondary | ICD-10-CM | POA: Insufficient documentation

## 2014-10-22 DIAGNOSIS — N6002 Solitary cyst of left breast: Secondary | ICD-10-CM

## 2014-10-22 NOTE — Progress Notes (Deleted)
Patient ID: Vanessa Harrison, female   DOB: [birth year], 39 y.o.   MRN: [id number]  Chief Complaint  Patient presents with  . breast cyst    HPI Vanessa Harrison is a 39 y.o. female here for evaluation for a left breast cyst. Her most recent mammogram was on 10/10/14.  HPI  Past Medical History  Diagnosis Date  . Hypercholesteremia   . Anxiety   . Bipolar 2 disorder     Past Surgical History  Procedure Laterality Date  . Cesarean section    . Laparoscopic appendectomy N/A 09/24/2012    Procedure: APPENDECTOMY LAPAROSCOPIC;  Surgeon: Gwenyth Ober, MD;  Location: [facility];  Service: General;  Laterality: N/A;  . Appendectomy  09/2012    lap appy    Family History  Problem Relation Age of Onset  . Diabetes Mother   . Hypertension Mother   . Diabetes Sister   . Hypertension Sister   . Cancer Sister 61    breast  . Hypertension Brother     Social History Social History  Substance Use Topics  . Smoking status: Never Smoker   . Smokeless tobacco: Never Used  . Alcohol Use: No    No Known Allergies  Current Outpatient Prescriptions  Medication Sig Dispense Refill  . haloperidol (HALDOL) 2 MG tablet Take 2 mg by mouth at bedtime.    Marland Kitchen lithium carbonate 300 MG capsule Take 600 mg by mouth 2 (two) times daily with a meal.    . clonazePAM (KLONOPIN) 0.5 MG tablet Take 0.5 mg by mouth at bedtime as needed for anxiety.     . temazepam (RESTORIL) 30 MG capsule Take 1 capsule (30 mg total) by mouth at bedtime as needed for sleep. 30 capsule 1   No current facility-administered medications for this visit.    Review of Systems Review of Systems  Constitutional: Negative.   Respiratory: Negative.   Cardiovascular: Negative.     Last menstrual period 09/16/2014.  Physical Exam Physical Exam  Constitutional: She is oriented to person, place, and time.  Neurological: She is alert and oriented to person, place, and time.    Data Reviewed ***  Assessment    ***     Plan    ***    Ref: Brand Males, Caryl-Lyn M 10/22/2014, 1:18 PM

## 2014-10-22 NOTE — Progress Notes (Addendum)
Patient ID: Vanessa Harrison, female   DOB: [birth year], 39 y.o.   MRN: [id number]  Chief Complaint  Patient presents with  . breast cyst    Left breast cyst, has been there for about 1 month, no pain or nipple discharge.    HPI Vanessa Harrison is a 39 y.o. female.  Patient in today for left breast cyst, was seen on a mammogram/ultrasound 10/10/14.  No Pain or nipple discharge.  Going on for about 1 month.  Interpreter Chip Boer present for interview, exam and discussion.  HPI  Past Medical History  Diagnosis Date  . Hypercholesteremia   . Anxiety   . Bipolar 2 disorder     Past Surgical History  Procedure Laterality Date  . Cesarean section    . Laparoscopic appendectomy N/A 09/24/2012    Procedure: APPENDECTOMY LAPAROSCOPIC;  Surgeon: Gwenyth Ober, MD;  Location: [place];  Service: General;  Laterality: N/A;  . Appendectomy  09/2012    lap appy    Family History  Problem Relation Age of Onset  . Diabetes Mother   . Hypertension Mother   . Diabetes Sister   . Hypertension Sister   . Cancer Sister 28    breast  . Hypertension Brother     Social History Social History  Substance Use Topics  . Smoking status: Never Smoker   . Smokeless tobacco: Never Used  . Alcohol Use: No    No Known Allergies  Current Outpatient Prescriptions  Medication Sig Dispense Refill  . haloperidol (HALDOL) 2 MG tablet Take 2 mg by mouth at bedtime.    Marland Kitchen lithium carbonate 300 MG capsule Take 600 mg by mouth 2 (two) times daily with a meal.     No current facility-administered medications for this visit.    Review of Systems Review of Systems  Constitutional: Negative.   Respiratory: Negative.   Cardiovascular: Negative.     Blood pressure 102/70, pulse 76, resp. rate 16, height 5\' 4"  (1.626 m), weight 176 lb (79.833 kg), last menstrual period 09/16/2014.  Physical Exam Physical Exam  Constitutional: She is oriented to person, place, and time. She appears well-developed and  well-nourished.  HENT:  Mouth/Throat: Oropharynx is clear and moist.  Eyes: Conjunctivae are normal. No scleral icterus.  Neck: Neck supple.  Cardiovascular: Normal rate, regular rhythm and normal heart sounds.   Pulmonary/Chest: Effort normal and breath sounds normal. Right breast exhibits no inverted nipple, no mass, no nipple discharge, no skin change and no tenderness. Left breast exhibits no inverted nipple, no mass, no nipple discharge, no skin change and no tenderness.    Thickening at 12 o'clock in right breast 2 lesions in 1 and 2 o'clock about 2 cm in left breast   Lymphadenopathy:    She has no cervical adenopathy.  Neurological: She is alert and oriented to person, place, and time.  Skin: Skin is warm and dry.  Psychiatric: Her behavior is normal.    Data Reviewed Bilateral mammogram and left breast ultrasound dated 10/10/2014 were reviewed. Dense breast consistent with age. 2 dominant lesions in the upper-outer quadrant of the left breast confirmed to be cystic on ultrasound. BI-RADS-2.  Ill-defined densities within the 12:00 position of the right breast on my review of the mammograms.  Ultrasound examination of the right breast was undertaken to assess the distinct palpable fullness in the 12:00 position. The patient was identified with multiple cysts centered 4-6 cm from the nipple. At 12:00 position this measured 0.94 x 0.95 x 1.0  cm. Just medial and cephalad to this at the 11:00 position a 0.78 x 0.88 x 1.12 cm cyst was identified. The bridging distance between these lesions was approximate 3 cm accounting for the palpable fullness. At the 10:00 position additional simple cyst measuring 0.75 x 1.1 x 1.33 cm was appreciated. BI-RADS-2.  Assessment    Breast cyst, asymptomatic.    Plan    No intervention is required at this time. The patient was encouraged to call if she develops pain or enlargement. She is not making use of any significant quantity of caffeine. No  dietary modification as recommended.     Call if cysts get larger and painful.   LDJ:[id number]   Vanessa Harrison 10/26/2014, 11:08 AM   Ref [facility]

## 2014-10-22 NOTE — Patient Instructions (Addendum)
Call with any questions or concerns. Continue self breast checks and mammograms.

## 2014-10-24 ENCOUNTER — Encounter: Payer: Self-pay | Admitting: *Deleted

## 2014-10-24 NOTE — Progress Notes (Signed)
Patient seen by Dr. Bary Castilla.  To follow up in one year with annual screening.  HSIS to [place].

## 2017-09-20 ENCOUNTER — Inpatient Hospital Stay (HOSPITAL_COMMUNITY)
Admission: AD | Admit: 2017-09-20 | Discharge: 2017-09-20 | Disposition: A | Discharge status: Home / Self Care | Payer: Self-pay | Source: Ambulatory Visit | Attending: Obstetrics and Gynecology | Admitting: Obstetrics and Gynecology

## 2017-09-20 DIAGNOSIS — N644 Mastodynia: Secondary | ICD-10-CM | POA: Insufficient documentation

## 2017-09-20 DIAGNOSIS — N63 Unspecified lump in unspecified breast: Secondary | ICD-10-CM

## 2017-09-20 DIAGNOSIS — Z8249 Family history of ischemic heart disease and other diseases of the circulatory system: Secondary | ICD-10-CM | POA: Insufficient documentation

## 2017-09-20 DIAGNOSIS — F3181 Bipolar II disorder: Secondary | ICD-10-CM | POA: Insufficient documentation

## 2017-09-20 DIAGNOSIS — Z833 Family history of diabetes mellitus: Secondary | ICD-10-CM | POA: Insufficient documentation

## 2017-09-20 DIAGNOSIS — F419 Anxiety disorder, unspecified: Secondary | ICD-10-CM | POA: Insufficient documentation

## 2017-09-20 DIAGNOSIS — N6320 Unspecified lump in the left breast, unspecified quadrant: Secondary | ICD-10-CM | POA: Insufficient documentation

## 2017-09-20 DIAGNOSIS — N6310 Unspecified lump in the right breast, unspecified quadrant: Secondary | ICD-10-CM | POA: Insufficient documentation

## 2017-09-20 DIAGNOSIS — Z803 Family history of malignant neoplasm of breast: Secondary | ICD-10-CM | POA: Insufficient documentation

## 2017-09-20 DIAGNOSIS — Z79899 Other long term (current) drug therapy: Secondary | ICD-10-CM | POA: Insufficient documentation

## 2017-09-20 NOTE — MAU Provider Note (Signed)
History     CSN: [id number]  Arrival date and time: 09/20/17 1336  Chief Complaint  Patient presents with  . Breast Pain  . Breast Discharge   HPI Vanessa Harrison is a 42 y.o. [phone] non pregnant female who presents with breast pain and breast masses. She reports bilateral breast masses that she noticed approximately 3 months ago, but states they are larger now. She states they are painful to the touch and it hurts to lay down. She states the mass on the left is larger. She also reports clear nipple discharge with manipulation. She reports a history of having fluid filled cysts drained from her breasts in the past but states this does not feel the same.   OB History    Gravida  4   Para  1   Term      Preterm      AB  3   Living  1     SAB      TAB      Ectopic      Multiple      Live Births           Obstetric Comments  1st Menstrual Cycle:  13 1st Pregnancy:  22         Past Medical History:  Diagnosis Date  . Anxiety   . Bipolar 2 disorder   . Hypercholesteremia     Past Surgical History:  Procedure Laterality Date  . APPENDECTOMY  09/2012   lap appy  . CESAREAN SECTION    . LAPAROSCOPIC APPENDECTOMY N/A 09/24/2012   Procedure: APPENDECTOMY LAPAROSCOPIC;  Surgeon: Gwenyth Ober, MD;  Location: [facility];  Service: General;  Laterality: N/A;    Family History  Problem Relation Age of Onset  . Diabetes Mother   . Hypertension Mother   . Diabetes Sister   . Hypertension Sister   . Cancer Sister 26       breast  . Hypertension Brother     Social History   Tobacco Use  . Smoking status: Never Smoker  . Smokeless tobacco: Never Used  Substance Use Topics  . Alcohol use: No  . Drug use: No    Allergies: No Known Allergies  Medications Prior to Admission  Medication Sig Dispense Refill Last Dose  . haloperidol (HALDOL) 2 MG tablet Take 2 mg by mouth at bedtime.   Taking  . lithium carbonate 300 MG capsule Take 600 mg by mouth  2 (two) times daily with a meal.   Taking    Review of Systems  Constitutional: Negative.  Negative for fatigue and fever.  HENT: Negative.   Respiratory: Negative.  Negative for shortness of breath.   Cardiovascular: Negative.  Negative for chest pain.  Gastrointestinal: Negative.  Negative for abdominal pain, constipation, diarrhea, nausea and vomiting.  Genitourinary: Negative.  Negative for dysuria.  Musculoskeletal:       Breast pain  Neurological: Negative.  Negative for dizziness and headaches.   Physical Exam   Blood pressure 124/71, pulse 60, temperature 97.9 F (36.6 C), temperature source Oral, resp. rate 18, weight 74.8 kg, last menstrual period 08/30/2017.  Physical Exam  Nursing note and vitals reviewed. Constitutional: She is oriented to person, place, and time. She appears well-developed and well-nourished. No distress.  HENT:  Head: Normocephalic.  Eyes: Pupils are equal, round, and reactive to light.  Cardiovascular: Normal rate, regular rhythm and normal heart sounds.  Respiratory: Effort normal and breath sounds normal. No respiratory  distress. Right breast exhibits mass. Right breast exhibits no nipple discharge, no skin change and no tenderness. Left breast exhibits mass and tenderness. Left breast exhibits no nipple discharge and no skin change.    GI: Soft. Bowel sounds are normal. She exhibits no distension. There is no tenderness.  Neurological: She is alert and oriented to person, place, and time.  Skin: Skin is warm and dry.  Psychiatric: She has a normal mood and affect. Her behavior is normal. Judgment and thought content normal.    MAU Course  Procedures  MDM Referral message sent to [facility] to schedule patient for diagnostic mammogram as soon as possible due to patient not having insurance and self pay.  Discussed with patient follow up plan and patient verbalized understanding.   Assessment and Plan   1. Breast lump   2. Breast pain     -Discharge home in stable condition -Patient advised to follow-up with [facility] if no call from [facility] program. -Patient may return to MAU as needed or if her condition were to change or worsen  Wende Mott CNM 09/20/2017, 2:11 PM

## 2017-09-20 NOTE — Progress Notes (Signed)
[name], CNM explained discharge instructions to patient with Vanessa Harrison (spanish Interpreter).  Pt verbalized understanding of dc instructions and signed paper copy of AVS form.  Dc'd home in good condition.

## 2017-09-20 NOTE — MAU Note (Signed)
Pt states she has lump on L breast, also one on R - lump on L breast is larger, swollen.  They have been there for approximately 3 months, but are larger now.  L breast is painful.  Has discharge from nipples with pressure.

## 2017-09-20 NOTE — Discharge Instructions (Signed)
Quiste mamario (Breast Cyst) Un quiste mamario es un saco lleno de lquido en la mama. Son frecuentes en las mujeres. Algunas mujeres pueden presentar uno o muchos quistes. Cuando las [employer] presentan muchos quistes, generalmente se debe a una enfermedad no cancerosa (benigna) llamada cambios fibroqusticos. Estos bultos se forman por la influencia de las hormonas femeninas (estrgenos y Immunologist). Generalmente aparecen en la zona superior externa de la mama. A menudo estn ms hinchados, son ms dolorosos y sensibles antes del comienzo del perodo. Generalmente desaparecen despus de la menopausia, excepto que siga una terapia hormonal. Hay varios tipos de [employer]. Es un quiste de alrededor de 2 pulgadas (5.1 cm) de dimetro.  Microquiste. Es un quiste pequeo que no puede sentirse pero puede verse en una mamografa y Earl Lagos.  Galactocele. El quiste contiene leche que puede desarrollarse si deja de Economist bruscamente.  Quiste cebceo de la piel. Este tipo de quiste no se encuentra en el tejido mamario mismo. Los quistes mamarios no aumentan el riesgo de Restaurant manager, fast food de mama. Sin embargo, deben controlarse de cerca debido a que pueden ser [employer]. CAUSAS No se conoce la causa exacta de la formacin de un quiste mamario. Las causas posibles son:  Un desarrollo excesivo de las glndulas mamarias y del tejido Doctor, general practice de la mama pueden obstruir los conductos, haciendo que se llenen de lquido.  El tejido cicatrizal en la mama por una ciruga previa puede obstruir las glndulas y formar un quiste. FACTORES DE RIESGO Los estrgenos pueden influir en el desarrollo de un quiste mamario. SIGNOS Y SNTOMAS  Sentir un bulto blando, redondo, (como una uva) en la mama, que es fcilmente movible.  [place]estia o Tourist information centre manager.  Aumento en el tamao del bulto antes del perodo menstrual y disminucin del tamao despus del perodo menstrual.  DIAGNSTICO El mdico podr  palparlo durante un examen fsico. Ardelia Mems radiografa de las mamas ([place]) y Ardelia Mems ecografa se indicarn para confirmar el diagnstico. Marin Comment quitarn el lquido del quiste con Ardelia Mems aguja (aspiracin con aguja fina) para asegurarse de que el quiste no es canceroso. TRATAMIENTO Es probable que no sea Systems analyst. El mdico controlar el quiste para observar si desaparece por s mismo. En caso de ser necesario un tratamiento, este podr ser:  Belva Crome hormonal.  Aspiracin con Chauncy Lean. Hay una posibilidad de que el quiste vuelva a aparecer despus de la aspiracin.  Ciruga para extirpar el quiste completo. INSTRUCCIONES PARA EL CUIDADO EN EL HOGAR  Cumpla con todas las visitas de control, segn le indique su mdico.  Consulte a su mdico regularmente. ? Hgase un examen anual con su mdico. ? Concurra al mdico para un examen clnico de las mamas cada 1 a 3 aos si tiene entre 20 y 71 aos. Despus de los 40 aos debe hacerlo todos los [place]. ? Hgase una mamografa segn las indicaciones del mdico.  Reconozca la apariencia normal de sus mamas y [place] y realice un autoexamen de Glass blower/designer.  Tome slo medicamentos de venta libre o recetados, segn las indicaciones del mdico.  Use un sostn de soporte, especialmente al hacer ejercicios.  Evite la cafena.  Reduzca la ingesta de sal, especialmente antes del perodo menstrual. Demasiada sal puede causar retencin de lquidos, hinchazn de las mamas y [place].  SOLICITE ATENCIN MDICA SI:  Siente, o cree que siente, un bulto en la mama.  Nota que ambas mamas son diferentes a lo habitual.  Las mamas an causan dolor despus  que ha finalizado su perodo menstrual.  Necesita medicamentos para Conservation officer, historic buildings y la hinchazn que se produce en las mamas durante el perodo menstrual.  SOLICITE ATENCIN MDICA DE INMEDIATO SI:  Siente un dolor intenso, sensibilidad, irritacin o calor en la mama.  Tiene secrecin o  un sangrado por el pezn.  El bulto de la mama se endurece y le duele.  Tiene bultos o lunares nuevos que no tena antes.  Siente bultos en la axila.  Nota hoyuelos o arrugas en la mama o el pezn.  Tiene fiebre.  ASEGRESE DE QUE:  Comprende estas instrucciones.  Controlar su afeccin.  Recibir ayuda de inmediato si no mejora o si empeora.  Esta informacin no tiene Marine scientist el consejo del mdico. Asegrese de hacerle al mdico cualquier pregunta que tenga. Document Released: 01/26/2005 Document Revised: 05/20/2015 Document Reviewed: 07/30/2015 Elsevier Interactive Patient Education  2017 [employer].

## 2017-09-21 ENCOUNTER — Ambulatory Visit
Admission: RE | Admit: 2017-09-21 | Discharge: 2017-09-21 | Disposition: A | Discharge status: Home / Self Care | Payer: No Typology Code available for payment source | Source: Ambulatory Visit | Attending: Obstetrics and Gynecology | Admitting: Obstetrics and Gynecology

## 2017-09-21 ENCOUNTER — Encounter (HOSPITAL_COMMUNITY): Payer: Self-pay

## 2017-09-21 ENCOUNTER — Ambulatory Visit (HOSPITAL_COMMUNITY)
Admission: RE | Admit: 2017-09-21 | Discharge: 2017-09-21 | Disposition: A | Discharge status: Home / Self Care | Payer: Self-pay | Source: Ambulatory Visit | Attending: Obstetrics and Gynecology | Admitting: Obstetrics and Gynecology

## 2017-09-21 ENCOUNTER — Other Ambulatory Visit: Payer: Self-pay | Admitting: Obstetrics and Gynecology

## 2017-09-21 VITALS — BP 118/70 | Ht 64.0 in

## 2017-09-21 DIAGNOSIS — N6452 Nipple discharge: Secondary | ICD-10-CM

## 2017-09-21 DIAGNOSIS — Z01419 Encounter for gynecological examination (general) (routine) without abnormal findings: Secondary | ICD-10-CM

## 2017-09-21 DIAGNOSIS — N63 Unspecified lump in unspecified breast: Secondary | ICD-10-CM

## 2017-09-21 DIAGNOSIS — N6311 Unspecified lump in the right breast, upper outer quadrant: Secondary | ICD-10-CM

## 2017-09-21 DIAGNOSIS — N6320 Unspecified lump in the left breast, unspecified quadrant: Secondary | ICD-10-CM

## 2017-09-21 NOTE — Progress Notes (Signed)
Complaints of bilateral breast lumps the left breast lump x 3 months and the right breast lump around 6 months. Patient states the left breast lump is painful when touched. Patient rates the pain at a 6 out of 10. Patient complained of bilateral milky breast discharge when expressed x one week.   Pap Smear: Pap smear completed today. Last Pap smear was three years ago and normal per patient. Per patient has a history of an abnormal Pap smear 15-16 years ago that a colposcopy and LEEP were completed for follow-up. Patient stated she has had at least three normal Pap smears since LEEP. No Pap smear results are in Epic.  Physical exam: Breasts Breasts symmetrical. No skin abnormalities bilateral breasts. No nipple retraction bilateral breasts. No nipple discharge left breast. Unable to express and left breast discharge on exam. Expressed a clear to milky colored discharge from the right breast on exam. Sample of discharge sent to Cytology for evaluation. No lymphadenopathy. Palpated a lump within the right upper outer quadrant 2 cm from the nipple. Palpated a lump within the left breast between 1 o'clock and 3 o'clock 4 cm from the nipple. Complaints of tenderness when palpated left breast lump on exam. Referred patient to the [facility] for a diagnostic mammogram and bilateral breast ultrasound. Appointment scheduled for Tuesday, September 21, 2017 at 1530.        Pelvic/Bimanual   Ext Genitalia No lesions, no swelling and no discharge observed on external genitalia.         Vagina Vagina pink and normal texture. No lesions or discharge observed in vagina.          Cervix Cervix is present. Cervix pink with reddened area around cervical os and of normal texture. No discharge observed.     Uterus Uterus is present and palpable. Uterus is retroverted and normal size.        Adnexae Bilateral ovaries present and palpable. No tenderness on palpation.         Rectovaginal No rectal  exam completed today since patient had no rectal complaints. No skin abnormalities observed on exam.    Smoking History: Patient has never smoked.  Patient Navigation: Patient education provided. Access to services provided for patient through [facility] program. Spanish interpreter provided.   Breast and Cervical Cancer Risk Assessment: Patient has a family history of her sister having breast cancer. Patient has no known genetic mutations or history of radiation treatment to the chest before age 45. Per patient has a history of cervical dysplasia. Patient has no history of being immunocompromised or DES exposure in-utero.  Risk Assessment    Risk Scores      09/21/2017   Last edited by: Loletta Parish, RN   5-year risk: 0.9 %   Lifetime risk: 11.7 %         Used Spanish interpreter Rudene Anda from [place].No complaints today.

## 2017-09-21 NOTE — Patient Instructions (Signed)
Explained breast self awareness with [name]. Let patient know [facility] will cover Pap smears and HPV typing every 5 years unless has a history of abnormal Pap smears. Referred patient to the [facility] for a diagnostic mammogram and bilateral breast ultrasound. Appointment scheduled for Tuesday, September 21, 2017 at 1530. Let patient know will follow up with her within the next couple weeks with results of Pap smear and breast discharge by letter or phone. Vanessa Harrison verbalized understanding.  Shine Mikes, Arvil Chaco, RN 2:54 PM

## 2017-09-23 ENCOUNTER — Telehealth (HOSPITAL_COMMUNITY): Payer: Self-pay | Admitting: *Deleted

## 2017-09-23 LAB — CYTOLOGY - PAP
DIAGNOSIS: UNDETERMINED — AB
HPV: NOT DETECTED

## 2017-09-23 NOTE — Telephone Encounter (Signed)
Called patient with Spanish interpreter Rudene Anda from [facility] to discuss breast discharge and Pap smear results. Explained to patient that her breast discharge showed some abnormal cells that a right breast biopsy is recommended for follow-up per Dr. Elly Modena. Referred patient to [facility] surgery for a surgical consult to discuss breast discharge results and follow-up. Appointment scheduled for Friday, October 01, 2017 at 1530. Gave patient follow-up information that included date, time, phone number, and explained it is covered by [facility]. Told patient to bring her pink [facility] card to the appointment. In addition, gave patient her Pap smear results. Explained to patient that her Pap smear showed some abnormal cells and the HPV was negative. Explained to patient that a follow-up Pap smear in one year is recommended. Informed patient that she can come to [facility] to have that completed. Patient verbalized understanding.

## 2017-10-05 ENCOUNTER — Other Ambulatory Visit (HOSPITAL_COMMUNITY): Payer: Self-pay | Admitting: General Surgery

## 2017-10-05 DIAGNOSIS — N6452 Nipple discharge: Secondary | ICD-10-CM

## 2017-10-13 ENCOUNTER — Encounter (HOSPITAL_COMMUNITY): Payer: Self-pay | Admitting: *Deleted

## 2017-10-13 ENCOUNTER — Ambulatory Visit (HOSPITAL_COMMUNITY): Admission: RE | Admit: 2017-10-13 | Payer: Self-pay | Source: Ambulatory Visit

## 2017-10-20 ENCOUNTER — Ambulatory Visit (HOSPITAL_COMMUNITY)
Admission: RE | Admit: 2017-10-20 | Discharge: 2017-10-20 | Disposition: A | Discharge status: Home / Self Care | Payer: Self-pay | Source: Ambulatory Visit | Attending: General Surgery | Admitting: General Surgery

## 2017-10-20 DIAGNOSIS — N6452 Nipple discharge: Secondary | ICD-10-CM | POA: Insufficient documentation

## 2017-10-20 MED ORDER — GADOBUTROL 1 MMOL/ML IV SOLN
7.5000 mL | Freq: Once | INTRAVENOUS | Status: AC | PRN
  Administered 2017-10-20: 7 mL via INTRAVENOUS

## 2017-10-28 ENCOUNTER — Other Ambulatory Visit: Payer: Self-pay | Admitting: General Surgery

## 2017-11-10 NOTE — H&P (Signed)
Filler  Location: [facility] Patient #: [id number] DOB: October 11, 1975 Married / Language: Undefined / Race: Undefined Female        History of Present Illness        The patient is a 42 year old female who presents with a complaint of nipple discharge. This patient returns with her husband to discuss management of her right nipple discharge. A professional Hispanic language interpreter was present throughout the encounter. Hadelyn present throughout the encounter as a chaperone. Originally referred by Dr. Elly Modena because of atypical cytology right nipple discharge. Imaging studies at [facility]. She is a [facility] patient.            No prior breast problems or breast surgery. She has bilateral lumps which have proven to be cysts. She has intermittent right nipple discharge. Not sure if this is elicited or not. She says it is intermittent, occasionally clear and occasionally brown. Cytology was performed and shows slight cellular atypia. Her sister had breast cancer age 65 and is a survivor. Bilateral mammograms and ultrasound showed category C distally. Palpable lumps or benign cyst. Category 1 negative for malignancy. After I initially saw her she was sent for MRI. There was no focal abnormality. Fibrocystic changes. I have told her that she probably does not have cancer but that because of her risk, including family history in a first line relative, and nipple discharge that excision is appropriate and she agrees.       Comorbidities include bipolar 2 disorder on Haldol and lithium. Anxiety. Hyperlipidemia. Laparoscopic appendectomy. C-section.       She will be scheduled for right breast lumpectomy, subareolar excision through circumareolar incision. I discussed indications, details, techniques, and risk of the surgery with the patient and her husband in detail. They're aware of the risk of bleeding, infection, nerve damage with numbness or chronic pain,  cosmetic deformity, reoperation if cancer. She understands all these issues. All of her questions are answered. She agrees with this plan.     Allergies  No Known Drug Allergies Allergies Reconciled   Medication History Haloperidol (Oral) Specific strength unknown - Active. No Current Medications (Taken starting 10/28/2017) Lithium Carbonate (Oral) Specific strength unknown - Active. Medications Reconciled  Vitals  Weight: 166.2 lb Height: 64in Body Surface Area: 1.81 m Body Mass Index: 28.53 kg/m  Pain Level: 0/10 Temp.: 97.69F(Temporal)  Pulse: 56 (Regular)  P.OX: 100% (Room air) BP: 116/80 (Sitting, Left Arm, Standard)     Physical Exam  General Mental Status-Alert. General Appearance-Not in acute distress. Build & Nutrition-Well nourished. Posture-Normal posture. Gait-Normal.  Head and Neck Head-normocephalic, atraumatic with no lesions or palpable masses. Trachea-midline. Thyroid Gland Characteristics - normal size and consistency and no palpable nodules.  Chest and Lung Exam Chest and lung exam reveals -on auscultation, normal breath sounds, no adventitious sounds and normal vocal resonance.  Breast Note: Breasts are medium sized. In the right breast at the 12 o'clock position there is a 2 cm mass that is rubbery and smooth and mobile and consistent with a cyst seen on mammogram. In the left breast there is a 2.5 cm mass in the upper outer quadrant that is mobile and smooth and feels like a cyst. This was also confirmed on ultrasound. Nipple and areolar skin looked fine. I could not elicit a discharge. There is no axillary adenopathy   Cardiovascular Cardiovascular examination reveals -normal heart sounds, regular rate and rhythm with no murmurs and femoral artery auscultation bilaterally reveals normal pulses, no  bruits, no thrills.  Abdomen Inspection Inspection of the abdomen reveals - No  Hernias. Palpation/Percussion Palpation and Percussion of the abdomen reveal - Soft, Non Tender, No Rigidity (guarding), No hepatosplenomegaly and No Palpable abdominal masses.  Neurologic Neurologic evaluation reveals -alert and oriented x 3 with no impairment of recent or remote memory, normal attention span and ability to concentrate, normal sensation and normal coordination.  Neuropsychiatric Note: Cooperative. He has good understanding and insight. Flattened affect. She is completely competent to understand the procedure proposed and its risks.   Musculoskeletal Normal Exam - Bilateral-Upper Extremity Strength Normal and Lower Extremity Strength Normal.    Assessment & Plan  DISCHARGE FROM RIGHT NIPPLE [phone])   you state that you continue to have discharge from your right nipple. you state that this is either clear or brown in color Breast MRI did not show any cancer or specific mass  Because of the atypical cytology and your family history of breast cancer in your sister, your risk of breast cancer is increased Most likely you do not have cancer, however. Excision of this area is recommended to prove that there is no cancer, and you agree to have this done you will be scheduled for right breast lumpectomy, subareolar excision in the near future I have discussed the indications, techniques, and risk of this surgery in detail with you and your husband  FAMILY HISTORY OF BREAST CANCER IN SISTER (Z80.3) BIPOLAR 2 DISORDER (F31.81) HYPERLIPIDEMIA, MILD (E78.5) BILATERAL BREAST CYSTS (N60.01)    Myria Steenbergen M. Dalbert Batman, M.D., [facility] General and Minimally invasive Surgery Breast and Colorectal Surgery Office:   [phone] Pager:   [phone]

## 2017-11-10 NOTE — Pre-Procedure Instructions (Signed)
Edeline Greening  11/10/2017      [facility] [place], [place] [address] [place] [address] Phone: [phone] Fax: [phone]    Your procedure is scheduled on Friday October 11th.  Report to [facility] Admitting at [place].M.  Call this number if you have problems the morning of surgery:  [phone]   Remember:  Do not eat after midnight.  You may drink clear liquids until 0430am  .  Clear liquids allowed are:                    Water, Carbonated beverages, Clear Tea and Black Coffee only    Take these medicines the morning of surgery with A SIP OF WATER   Atorvastatin  7 days prior to surgery STOP taking any Aspirin(unless otherwise instructed by your surgeon), Aleve, Naproxen, Ibuprofen, Motrin, Advil, Goody's, BC's, all herbal medications, fish oil, and all vitamins     Do not wear jewelry, make-up or nail polish.  Do not wear lotions, powders, or perfumes, or deodorant.  Do not shave 48 hours prior to surgery.  Men may shave face and neck.  Do not bring valuables to the hospital.  [facility] is not responsible for any belongings or valuables.  Contacts, dentures or bridgework may not be worn into surgery.  Leave your suitcase in the car.  After surgery it may be brought to your room.  For patients admitted to the hospital, discharge time will be determined by your treatment team.  Patients discharged the day of surgery will not be allowed to drive home.    [place]- Preparing For Surgery  Before surgery, you can play an important role. Because skin is not sterile, your skin needs to be as free of germs as possible. You can reduce the number of germs on your skin by washing with CHG (chlorahexidine gluconate) Soap before surgery.  CHG is an antiseptic cleaner which kills germs and bonds with the skin to continue killing germs even after washing.    Oral Hygiene is also important to reduce your  risk of infection.  Remember - BRUSH YOUR TEETH THE MORNING OF SURGERY WITH YOUR REGULAR TOOTHPASTE  Please do not use if you have an allergy to CHG or antibacterial soaps. If your skin becomes reddened/irritated stop using the CHG.  Do not shave (including legs and underarms) for at least 48 hours prior to first CHG shower. It is OK to shave your face.  Please follow these instructions carefully.   1. Shower the NIGHT BEFORE SURGERY and the MORNING OF SURGERY with CHG.   2. If you chose to wash your hair, wash your hair first as usual with your normal shampoo.  3. After you shampoo, rinse your hair and body thoroughly to remove the shampoo.  4. Use CHG as you would any other liquid soap. You can apply CHG directly to the skin and wash gently with a scrungie or a clean washcloth.   5. Apply the CHG Soap to your body ONLY FROM THE NECK DOWN.  Do not use on open wounds or open sores. Avoid contact with your eyes, ears, mouth and genitals (private parts). Wash Face and genitals (private parts)  with your normal soap.  6. Wash thoroughly, paying special attention to the area where your surgery will be performed.  7. Thoroughly rinse your body with warm water from the neck down.  8. DO NOT  shower/wash with your normal soap after using and rinsing off the CHG Soap.  9. Pat yourself dry with a CLEAN TOWEL.  10. Wear CLEAN PAJAMAS to bed the night before surgery, wear comfortable clothes the morning of surgery  11. Place CLEAN SHEETS on your bed the night of your first shower and DO NOT SLEEP WITH PETS.    Day of Surgery:  Do not apply any deodorants/lotions.  Please wear clean clothes to the hospital/surgery center.   Remember to brush your teeth WITH YOUR REGULAR TOOTHPASTE.    Please read over the following fact sheets that you were given.

## 2017-11-11 ENCOUNTER — Other Ambulatory Visit: Payer: Self-pay

## 2017-11-11 ENCOUNTER — Encounter (HOSPITAL_COMMUNITY): Payer: Self-pay

## 2017-11-11 ENCOUNTER — Encounter (HOSPITAL_COMMUNITY)
Admission: RE | Admit: 2017-11-11 | Discharge: 2017-11-11 | Disposition: A | Discharge status: Home / Self Care | Payer: Self-pay | Source: Ambulatory Visit | Attending: General Surgery | Admitting: General Surgery

## 2017-11-11 DIAGNOSIS — Z01812 Encounter for preprocedural laboratory examination: Secondary | ICD-10-CM | POA: Insufficient documentation

## 2017-11-11 LAB — COMPREHENSIVE METABOLIC PANEL
ALT: 22 U/L (ref 0–44)
ANION GAP: 7 (ref 5–15)
AST: 21 U/L (ref 15–41)
Albumin: 4.2 g/dL (ref 3.5–5.0)
Alkaline Phosphatase: 108 U/L (ref 38–126)
BUN: 8 mg/dL (ref 6–20)
CHLORIDE: 108 mmol/L (ref 98–111)
CO2: 23 mmol/L (ref 22–32)
Calcium: 9.8 mg/dL (ref 8.9–10.3)
Creatinine, Ser: 0.61 mg/dL (ref 0.44–1.00)
GFR calc Af Amer: >60 mL/min (ref >60)
Glucose, Bld: 99 mg/dL (ref 70–99)
POTASSIUM: 4 mmol/L (ref 3.5–5.1)
Sodium: 138 mmol/L (ref 135–145)
Total Bilirubin: 0.7 mg/dL (ref 0.3–1.2)
Total Protein: 7.8 g/dL (ref 6.5–8.1)

## 2017-11-11 LAB — CBC WITH DIFFERENTIAL/PLATELET
Abs Immature Granulocytes: 0 10*3/uL (ref 0.0–0.1)
BASOS PCT: 0 %
Basophils Absolute: 0 10*3/uL (ref 0.0–0.1)
EOS ABS: 0.1 10*3/uL (ref 0.0–0.7)
Eosinophils Relative: 1 %
HCT: 42 % (ref 36.0–46.0)
Hemoglobin: 13.2 g/dL (ref 12.0–15.0)
IMMATURE GRANULOCYTES: 0 %
Lymphocytes Relative: 30 %
Lymphs Abs: 3.2 10*3/uL (ref 0.7–4.0)
MCH: 28.9 pg (ref 26.0–34.0)
MCHC: 31.4 g/dL (ref 30.0–36.0)
MCV: 92.1 fL (ref 78.0–100.0)
MONOS PCT: 4 %
Monocytes Absolute: 0.4 10*3/uL (ref 0.1–1.0)
NEUTROS PCT: 65 %
Neutro Abs: 6.9 10*3/uL (ref 1.7–7.7)
PLATELETS: 301 10*3/uL (ref 150–400)
RBC: 4.56 MIL/uL (ref 3.87–5.11)
RDW: 13.6 % (ref 11.5–15.5)
WBC: 10.6 10*3/uL — AB (ref 4.0–10.5)

## 2017-11-11 NOTE — Pre-Procedure Instructions (Signed)
Instrucciones Para Antes de la Ciruga   Su ciruga est programada para-(your procedure is scheduled on) October 11th at 5:30 am   [place] - (enter)    Por favor llame al [phone] si tiene algn problema en la maana de la ciruga. (please call if you have any problems the morning of surgery.)                  Recuerde: (Remember)   No alimentos ni tome lquidos, incluyendo agua, despus de la medianoche del     [place] estas medicinas en la maana de la ciruga con un SORBITO de agua (take these meds the morning of surgery with a SIP of water)  none   Puede cepillarse los dientes en la maana de la ciruga. (you may brush your teeth the morning of surgery)   No use joyas, maquillaje de ojos, lpiz labial, crema para el cuerpo o esmalte de uas oscuro. (Do not wear jewelry, eye makeup, lipstick, body lotion, or dark fingernail polish)   No puede usar desodorante. (you may wear deodorant)   Si va a ser ingresado despues de la ciruga, deje la maleta en el carro hasta que se le haya asignado una habitacin. (If you are to be admitted after surgery, leave suitcase in car until your room has been assigned.)   A los pacientes que se les d de alta el mismo da no se les permitir manejar a casa.  (Patients discharged on the day of surgery will not be allowed to drive home)   Use ropa suelta y cmoda de regreso a casa. (wear loose comfortable clothes for ride home)    Firma del paciente (patient signature) ______________________________________   Vanessa Harrison  11/11/2017      [place], [place] [address] [place] [address] Phone: [phone] Fax: [phone]    Your procedure is scheduled on Friday October 11th.  Report to [facility] Admitting at 530 A.M.  Call this number if you have problems the morning of  surgery:  [phone]   Remember:  Do not or drink after midnight.  You may drink clear liquids until 430am .  Clear liquids allowed are:                    Water, Juice (non-citric and without pulp), Carbonated beverages and Black Coffee only    Take these medicines the morning of surgery with A SIP OF WATER   none    Do not wear jewelry, make-up or nail polish.  Do not wear lotions, powders, or perfumes, or deodorant.  Do not shave 48 hours prior to surgery.  Men may shave face and neck.  Do not bring valuables to the hospital.  [facility] is not responsible for any belongings or valuables.  Contacts, dentures or bridgework may not be worn into surgery.  Leave your suitcase in the car.  After surgery it may be brought to your room.  For patients admitted to the hospital, discharge time will be determined by your treatment team.  Patients discharged the day of surgery will not be allowed to drive home.    [place]- Preparing For Surgery  Before surgery, you can play an important role. Because skin is not sterile, your skin needs to be as free of germs as possible. You can reduce the number of germs on your skin by washing with CHG (  chlorahexidine gluconate) Soap before surgery.  CHG is an antiseptic cleaner which kills germs and bonds with the skin to continue killing germs even after washing.    Oral Hygiene is also important to reduce your risk of infection.  Remember - BRUSH YOUR TEETH THE MORNING OF SURGERY WITH YOUR REGULAR TOOTHPASTE  Please do not use if you have an allergy to CHG or antibacterial soaps. If your skin becomes reddened/irritated stop using the CHG.  Do not shave (including legs and underarms) for at least 48 hours prior to first CHG shower. It is OK to shave your face.  Please follow these instructions carefully.   1. Shower the NIGHT BEFORE SURGERY and the MORNING OF SURGERY with CHG.   2. If you chose to wash your hair, wash your hair first as usual  with your normal shampoo.  3. After you shampoo, rinse your hair and body thoroughly to remove the shampoo.  4. Use CHG as you would any other liquid soap. You can apply CHG directly to the skin and wash gently with a scrungie or a clean washcloth.   5. Apply the CHG Soap to your body ONLY FROM THE NECK DOWN.  Do not use on open wounds or open sores. Avoid contact with your eyes, ears, mouth and genitals (private parts). Wash Face and genitals (private parts)  with your normal soap.  6. Wash thoroughly, paying special attention to the area where your surgery will be performed.  7. Thoroughly rinse your body with warm water from the neck down.  8. DO NOT shower/wash with your normal soap after using and rinsing off the CHG Soap.  9. Pat yourself dry with a CLEAN TOWEL.  10. Wear CLEAN PAJAMAS to bed the night before surgery, wear comfortable clothes the morning of surgery  11. Place CLEAN SHEETS on your bed the night of your first shower and DO NOT SLEEP WITH PETS.    Day of Surgery:  Do not apply any deodorants/lotions.  Please wear clean clothes to the hospital/surgery center.   Remember to brush your teeth WITH YOUR REGULAR TOOTHPASTE.    Please read over the following fact sheets that you were given.

## 2017-11-11 NOTE — Progress Notes (Signed)
PCP - none   Chest x-ray - N/A  EKG - N/A  Blood Thinner Instructions: N/A Aspirin Instructions: N/A  Anesthesia review: none  Patient denies shortness of breath, fever, cough and chest pain at PAT appointment   Patient verbalized understanding of instructions that were given to them at the PAT appointment. Patient was also instructed that they will need to review over the PAT instructions again at home before surgery.

## 2017-11-18 ENCOUNTER — Encounter (HOSPITAL_COMMUNITY): Payer: Self-pay | Admitting: Anesthesiology

## 2017-11-18 NOTE — Anesthesia Preprocedure Evaluation (Addendum)
Anesthesia Evaluation  Patient identified by MRN, date of birth, ID band Patient awake    Reviewed: Allergy & Precautions, NPO status , Patient's Chart, lab work & pertinent test results  Airway Mallampati: I       Dental no notable dental hx. (+) Teeth Intact   Pulmonary neg pulmonary ROS,    Pulmonary exam normal breath sounds clear to auscultation       Cardiovascular negative cardio ROS Normal cardiovascular exam Rhythm:Regular Rate:Normal     Neuro/Psych PSYCHIATRIC DISORDERS Anxiety Bipolar Disorder negative neurological ROS     GI/Hepatic negative GI ROS, Neg liver ROS,   Endo/Other  negative endocrine ROS  Renal/GU negative Renal ROS  negative genitourinary   Musculoskeletal negative musculoskeletal ROS (+)   Abdominal Normal abdominal exam  (+)   Peds  Hematology negative hematology ROS (+)   Anesthesia Other Findings   Reproductive/Obstetrics                            Anesthesia Physical Anesthesia Plan  ASA: II  Anesthesia Plan: General   Post-op Pain Management:    Induction: Intravenous  PONV Risk Score and Plan: 4 or greater and Ondansetron, Dexamethasone and Midazolam  Airway Management Planned: LMA  Additional Equipment:   Intra-op Plan:   Post-operative Plan: Extubation in OR  Informed Consent: I have reviewed the patients History and Physical, chart, labs and discussed the procedure including the risks, benefits and alternatives for the proposed anesthesia with the patient or authorized representative who has indicated his/her understanding and acceptance.   Dental advisory given  Plan Discussed with: CRNA and Surgeon  Anesthesia Plan Comments:        Anesthesia Quick Evaluation

## 2017-11-19 ENCOUNTER — Encounter (HOSPITAL_COMMUNITY)
Admission: RE | Disposition: A | Discharge status: Home / Self Care | Payer: Self-pay | Source: Ambulatory Visit | Attending: General Surgery

## 2017-11-19 ENCOUNTER — Other Ambulatory Visit: Payer: Self-pay

## 2017-11-19 ENCOUNTER — Ambulatory Visit (HOSPITAL_COMMUNITY)
Admission: RE | Admit: 2017-11-19 | Discharge: 2017-11-19 | Disposition: A | Discharge status: Home / Self Care | Payer: Self-pay | Source: Ambulatory Visit | Attending: General Surgery | Admitting: General Surgery

## 2017-11-19 ENCOUNTER — Ambulatory Visit (HOSPITAL_COMMUNITY): Payer: Self-pay | Admitting: Anesthesiology

## 2017-11-19 ENCOUNTER — Encounter (HOSPITAL_COMMUNITY): Payer: Self-pay | Admitting: General Practice

## 2017-11-19 DIAGNOSIS — Z79899 Other long term (current) drug therapy: Secondary | ICD-10-CM | POA: Insufficient documentation

## 2017-11-19 DIAGNOSIS — Z803 Family history of malignant neoplasm of breast: Secondary | ICD-10-CM | POA: Insufficient documentation

## 2017-11-19 DIAGNOSIS — E785 Hyperlipidemia, unspecified: Secondary | ICD-10-CM | POA: Insufficient documentation

## 2017-11-19 DIAGNOSIS — N6011 Diffuse cystic mastopathy of right breast: Secondary | ICD-10-CM | POA: Insufficient documentation

## 2017-11-19 DIAGNOSIS — N6091 Unspecified benign mammary dysplasia of right breast: Secondary | ICD-10-CM | POA: Insufficient documentation

## 2017-11-19 DIAGNOSIS — N6002 Solitary cyst of left breast: Secondary | ICD-10-CM | POA: Insufficient documentation

## 2017-11-19 DIAGNOSIS — N6452 Nipple discharge: Secondary | ICD-10-CM | POA: Diagnosis present

## 2017-11-19 DIAGNOSIS — N6001 Solitary cyst of right breast: Secondary | ICD-10-CM | POA: Insufficient documentation

## 2017-11-19 DIAGNOSIS — D241 Benign neoplasm of right breast: Secondary | ICD-10-CM | POA: Insufficient documentation

## 2017-11-19 DIAGNOSIS — F419 Anxiety disorder, unspecified: Secondary | ICD-10-CM | POA: Insufficient documentation

## 2017-11-19 DIAGNOSIS — F3181 Bipolar II disorder: Secondary | ICD-10-CM | POA: Insufficient documentation

## 2017-11-19 HISTORY — DX: Nipple discharge: N64.52

## 2017-11-19 HISTORY — PX: BREAST LUMPECTOMY: SHX2

## 2017-11-19 LAB — POCT PREGNANCY, URINE: PREG TEST UR: NEGATIVE

## 2017-11-19 SURGERY — BREAST LUMPECTOMY
Anesthesia: General | Site: Breast | Laterality: Right

## 2017-11-19 MED ORDER — MIDAZOLAM HCL 2 MG/2ML IJ SOLN
INTRAMUSCULAR | Status: AC
  Filled 2017-11-19: qty 2

## 2017-11-19 MED ORDER — KETOROLAC TROMETHAMINE 30 MG/ML IJ SOLN
INTRAMUSCULAR | Status: AC
  Filled 2017-11-19: qty 1

## 2017-11-19 MED ORDER — LIDOCAINE 2% (20 MG/ML) 5 ML SYRINGE
INTRAMUSCULAR | Status: DC | PRN
  Administered 2017-11-19: 80 mg via INTRAVENOUS

## 2017-11-19 MED ORDER — KETOROLAC TROMETHAMINE 30 MG/ML IJ SOLN: 30.0000 mg | Freq: Once | INTRAMUSCULAR | Status: DC | PRN

## 2017-11-19 MED ORDER — ACETAMINOPHEN 500 MG PO TABS
1000.0000 mg | ORAL_TABLET | ORAL | Status: AC
  Administered 2017-11-19: 1000 mg via ORAL

## 2017-11-19 MED ORDER — FENTANYL CITRATE (PF) 250 MCG/5ML IJ SOLN
INTRAMUSCULAR | Status: AC
  Filled 2017-11-19: qty 5

## 2017-11-19 MED ORDER — ACETAMINOPHEN 650 MG RE SUPP: 650.0000 mg | RECTAL | Status: DC | PRN

## 2017-11-19 MED ORDER — OXYCODONE HCL 5 MG/5ML PO SOLN: 5.0000 mg | Freq: Once | ORAL | Status: DC | PRN

## 2017-11-19 MED ORDER — PROPOFOL 10 MG/ML IV BOLUS
INTRAVENOUS | Status: DC | PRN
  Administered 2017-11-19: 180 mg via INTRAVENOUS

## 2017-11-19 MED ORDER — ACETAMINOPHEN 500 MG PO TABS
ORAL_TABLET | ORAL | Status: AC
  Administered 2017-11-19: 1000 mg via ORAL
  Filled 2017-11-19: qty 2

## 2017-11-19 MED ORDER — OXYCODONE HCL 5 MG PO TABS: 5.0000 mg | ORAL_TABLET | ORAL | Status: DC | PRN

## 2017-11-19 MED ORDER — 0.9 % SODIUM CHLORIDE (POUR BTL) OPTIME
TOPICAL | Status: DC | PRN
  Administered 2017-11-19: 1000 mL

## 2017-11-19 MED ORDER — BUPIVACAINE-EPINEPHRINE (PF) 0.25% -1:200000 IJ SOLN
INTRAMUSCULAR | Status: AC
  Filled 2017-11-19: qty 30

## 2017-11-19 MED ORDER — MIDAZOLAM HCL 5 MG/5ML IJ SOLN
INTRAMUSCULAR | Status: DC | PRN
  Administered 2017-11-19: 2 mg via INTRAVENOUS

## 2017-11-19 MED ORDER — LIDOCAINE 2% (20 MG/ML) 5 ML SYRINGE
INTRAMUSCULAR | Status: AC
  Filled 2017-11-19: qty 5

## 2017-11-19 MED ORDER — FENTANYL CITRATE (PF) 100 MCG/2ML IJ SOLN: 25.0000 ug | INTRAMUSCULAR | Status: DC | PRN

## 2017-11-19 MED ORDER — SCOPOLAMINE 1 MG/3DAYS TD PT72
1.0000 | MEDICATED_PATCH | TRANSDERMAL | Status: DC
  Administered 2017-11-19: 1.5 mg via TRANSDERMAL
  Filled 2017-11-19: qty 1

## 2017-11-19 MED ORDER — PROPOFOL 10 MG/ML IV BOLUS
INTRAVENOUS | Status: AC
  Filled 2017-11-19: qty 20

## 2017-11-19 MED ORDER — ACETAMINOPHEN 160 MG/5ML PO SOLN: 325.0000 mg | ORAL | Status: DC | PRN

## 2017-11-19 MED ORDER — CHLORHEXIDINE GLUCONATE CLOTH 2 % EX PADS: 6.0000 | MEDICATED_PAD | Freq: Once | CUTANEOUS | Status: DC

## 2017-11-19 MED ORDER — CEFAZOLIN SODIUM-DEXTROSE 2-4 GM/100ML-% IV SOLN
INTRAVENOUS | Status: AC
  Filled 2017-11-19: qty 100

## 2017-11-19 MED ORDER — OXYCODONE HCL 5 MG PO TABS: 5.0000 mg | ORAL_TABLET | Freq: Once | ORAL | Status: DC | PRN

## 2017-11-19 MED ORDER — KETOROLAC TROMETHAMINE 30 MG/ML IJ SOLN
INTRAMUSCULAR | Status: DC | PRN
  Administered 2017-11-19: 30 mg via INTRAVENOUS

## 2017-11-19 MED ORDER — PHENYLEPHRINE 40 MCG/ML (10ML) SYRINGE FOR IV PUSH (FOR BLOOD PRESSURE SUPPORT)
PREFILLED_SYRINGE | INTRAVENOUS | Status: AC
  Filled 2017-11-19: qty 10

## 2017-11-19 MED ORDER — BUPIVACAINE-EPINEPHRINE (PF) 0.25% -1:200000 IJ SOLN
INTRAMUSCULAR | Status: DC | PRN
  Administered 2017-11-19: 10 mL

## 2017-11-19 MED ORDER — MEPERIDINE HCL 50 MG/ML IJ SOLN: 6.2500 mg | INTRAMUSCULAR | Status: DC | PRN

## 2017-11-19 MED ORDER — ONDANSETRON HCL 4 MG/2ML IJ SOLN
INTRAMUSCULAR | Status: DC | PRN
  Administered 2017-11-19: 4 mg via INTRAVENOUS

## 2017-11-19 MED ORDER — ACETAMINOPHEN 325 MG PO TABS: 650.0000 mg | ORAL_TABLET | ORAL | Status: DC | PRN

## 2017-11-19 MED ORDER — DEXAMETHASONE SODIUM PHOSPHATE 10 MG/ML IJ SOLN
INTRAMUSCULAR | Status: AC
  Filled 2017-11-19: qty 1

## 2017-11-19 MED ORDER — FENTANYL CITRATE (PF) 100 MCG/2ML IJ SOLN
INTRAMUSCULAR | Status: DC | PRN
  Administered 2017-11-19: 25 ug via INTRAVENOUS
  Administered 2017-11-19: 100 ug via INTRAVENOUS
  Administered 2017-11-19: 25 ug via INTRAVENOUS

## 2017-11-19 MED ORDER — ONDANSETRON HCL 4 MG/2ML IJ SOLN
4.0000 mg | Freq: Once | INTRAMUSCULAR | Status: AC | PRN
  Administered 2017-11-19: 4 mg via INTRAVENOUS

## 2017-11-19 MED ORDER — ONDANSETRON HCL 4 MG/2ML IJ SOLN
INTRAMUSCULAR | Status: AC
  Filled 2017-11-19: qty 2

## 2017-11-19 MED ORDER — GABAPENTIN 300 MG PO CAPS
300.0000 mg | ORAL_CAPSULE | ORAL | Status: AC
  Administered 2017-11-19: 300 mg via ORAL

## 2017-11-19 MED ORDER — DEXAMETHASONE SODIUM PHOSPHATE 10 MG/ML IJ SOLN
INTRAMUSCULAR | Status: DC | PRN
  Administered 2017-11-19: 10 mg via INTRAVENOUS

## 2017-11-19 MED ORDER — CELECOXIB 200 MG PO CAPS
ORAL_CAPSULE | ORAL | Status: AC
  Administered 2017-11-19: 200 mg via ORAL
  Filled 2017-11-19: qty 1

## 2017-11-19 MED ORDER — PROMETHAZINE HCL 25 MG/ML IJ SOLN
12.5000 mg | Freq: Once | INTRAMUSCULAR | Status: AC
  Administered 2017-11-19: 12.5 mg via INTRAVENOUS

## 2017-11-19 MED ORDER — CELECOXIB 200 MG PO CAPS
200.0000 mg | ORAL_CAPSULE | ORAL | Status: AC
  Administered 2017-11-19: 200 mg via ORAL

## 2017-11-19 MED ORDER — LACTATED RINGERS IV SOLN
INTRAVENOUS | Status: DC | PRN
  Administered 2017-11-19: 07:00:00 via INTRAVENOUS

## 2017-11-19 MED ORDER — PROMETHAZINE HCL 25 MG/ML IJ SOLN
INTRAMUSCULAR | Status: AC
  Filled 2017-11-19: qty 1

## 2017-11-19 MED ORDER — ACETAMINOPHEN 325 MG PO TABS: 325.0000 mg | ORAL_TABLET | ORAL | Status: DC | PRN

## 2017-11-19 MED ORDER — CEFAZOLIN SODIUM-DEXTROSE 2-4 GM/100ML-% IV SOLN
2.0000 g | INTRAVENOUS | Status: AC
  Administered 2017-11-19: 2 g via INTRAVENOUS

## 2017-11-19 MED ORDER — SCOPOLAMINE 1 MG/3DAYS TD PT72: 1.0000 | MEDICATED_PATCH | TRANSDERMAL | Status: DC

## 2017-11-19 MED ORDER — HYDROCODONE-ACETAMINOPHEN 5-325 MG PO TABS: 1.0000 | ORAL_TABLET | Freq: Four times a day (QID) | ORAL | 0 refills | Status: DC | PRN

## 2017-11-19 MED ORDER — GABAPENTIN 300 MG PO CAPS
ORAL_CAPSULE | ORAL | Status: AC
  Administered 2017-11-19: 300 mg via ORAL
  Filled 2017-11-19: qty 1

## 2017-11-19 SURGICAL SUPPLY — 30 items
CHLORAPREP W/TINT 26ML (MISCELLANEOUS) ×3 IMPLANT
COVER SURGICAL LIGHT HANDLE (MISCELLANEOUS) ×3 IMPLANT
DECANTER SPIKE VIAL GLASS SM (MISCELLANEOUS) ×3 IMPLANT
DERMABOND ADVANCED (GAUZE/BANDAGES/DRESSINGS) ×2
DERMABOND ADVANCED .7 DNX12 (GAUZE/BANDAGES/DRESSINGS) ×1 IMPLANT
DRAPE CHEST BREAST 15X10 FENES (DRAPES) ×3 IMPLANT
ELECT REM PT RETURN 9FT ADLT (ELECTROSURGICAL) ×3
ELECTRODE REM PT RTRN 9FT ADLT (ELECTROSURGICAL) ×1 IMPLANT
GLOVE BIOGEL PI IND STRL 6.5 (GLOVE) ×1 IMPLANT
GLOVE BIOGEL PI INDICATOR 6.5 (GLOVE) ×2
GLOVE EUDERMIC 7 POWDERFREE (GLOVE) ×3 IMPLANT
GLOVE SURG SS PI 6.0 STRL IVOR (GLOVE) ×3 IMPLANT
GLOVE SURG SS PI 7.0 STRL IVOR (GLOVE) ×3 IMPLANT
GOWN STRL REUS W/ TWL LRG LVL3 (GOWN DISPOSABLE) ×1 IMPLANT
GOWN STRL REUS W/ TWL XL LVL3 (GOWN DISPOSABLE) ×1 IMPLANT
GOWN STRL REUS W/TWL LRG LVL3 (GOWN DISPOSABLE) ×2
GOWN STRL REUS W/TWL XL LVL3 (GOWN DISPOSABLE) ×2
KIT BASIN OR (CUSTOM PROCEDURE TRAY) ×3 IMPLANT
KIT MARKER MARGIN INK (KITS) ×3 IMPLANT
KIT TURNOVER KIT B (KITS) ×3 IMPLANT
NEEDLE HYPO 25GX1X1/2 BEV (NEEDLE) ×3 IMPLANT
NS IRRIG 1000ML POUR BTL (IV SOLUTION) ×3 IMPLANT
PACK GENERAL/GYN (CUSTOM PROCEDURE TRAY) ×3 IMPLANT
PAD ARMBOARD 7.5X6 YLW CONV (MISCELLANEOUS) ×6 IMPLANT
PENCIL SMOKE EVAC W/HOLSTER (ELECTROSURGICAL) ×3 IMPLANT
SUT MNCRL AB 4-0 PS2 18 (SUTURE) ×3 IMPLANT
SUT SILK 2 0 SH (SUTURE) ×3 IMPLANT
SUT VIC AB 3-0 SH 18 (SUTURE) ×3 IMPLANT
SYR CONTROL 10ML LL (SYRINGE) ×3 IMPLANT
TOWEL GREEN STERILE FF (TOWEL DISPOSABLE) ×3 IMPLANT

## 2017-11-19 NOTE — Transfer of Care (Signed)
Immediate Anesthesia Transfer of Care Note  Patient: Vanessa Harrison  Procedure(s) Performed: RIGHT BREAST LUMPECTOMY SUBAREOLAR (Right Breast)  Patient Location: PACU  Anesthesia Type:General  Level of Consciousness: awake, drowsy and patient cooperative  Airway & Oxygen Therapy: Patient Spontanous Breathing and Patient connected to nasal cannula oxygen  Post-op Assessment: Report given to RN and Post -op Vital signs reviewed and stable  Post vital signs: Reviewed and stable  Last Vitals:  Vitals Value Taken Time  BP    Temp    Pulse    Resp    SpO2      Last Pain:  Vitals:   11/19/17 0622  TempSrc:   PainSc: 0-No pain      Patients Stated Pain Goal: 2 (36/43/83 [id number])  Complications: No apparent anesthesia complications

## 2017-11-19 NOTE — Op Note (Signed)
Patient Name:           Vanessa Harrison   Date of Surgery:        11/19/2017  Pre op Diagnosis:      Right nipple discharge with atypical cytology  Post op Diagnosis:    Right nipple discharge with atypical cytology  Procedure:                 Right breast lumpectomy, subareolar, with margin assessment  Surgeon:                     Edsel Petrin. Dalbert Batman, M.D., FACS  Assistant:                      OR staff  Operative Indications:   The patient is a 42 year old female who presents with a complaint of nipple discharge.  Originally referred by Dr. Elly Modena because of atypical cytology right nipple discharge. Imaging studies at [facility]. She is a [facility] patient.            No prior breast problems or breast surgery. She has bilateral lumps which have proven to be cysts. She has intermittent right nipple discharge. Not sure if this is elicited or not. She says it is intermittent, occasionally clear and occasionally brown. Cytology was performed and shows slight cellular atypia. Her sister had breast cancer age 50 and is a survivor. Bilateral mammograms and ultrasound showed category C distally. Palpable lumps or benign cyst. Category 1 negative for malignancy. After I initially saw her she was sent for MRI. There was no focal abnormality. Fibrocystic changes. I have told her that she probably does not have cancer but that because of her risk, including family history in a first line relative, and nipple discharge that excision is appropriate and she agrees.       She will be scheduled for right breast lumpectomy, subareolar excision through circumareolar incision.   Operative Findings:       The subareolar breast tissue was excised.  The dissection was carried anteriorly to the immediate subdermal area around the nipple.  A 2.5 cm specimen was excised and marked for margin assessment.  There was no palpable mass.  Procedure in Detail:          Following the induction of general LMA  anesthesia the patient's right breast was prepped and draped in a sterile fashion.  Surgical timeout was performed.  Intravenous antibiotics were given.  0.25% Marcaine with epinephrine was used as a local infiltration anesthetic.  A circumareolar incision was made at the areolar margin, inferiorly.  Lumpectomy was performed excising all of the subareolar tissue as described above.  Specimen was marked with silk sutures and a 6 color ink kit to orient the pathologist.  Specimen was sent to the lab.  Hemostasis was excellent and achieved with electrocautery.  The wound was irrigated.  The breast tissues were closed in 2 layers with interrupted 3-0 Vicryls and the skin closed with a running subcuticular 4-0 Monocryl and Dermabond.  The patient tolerated the procedure well was taken to PACU in stable condition.  EBL 10 cc.  Counts correct.  Complications none.    Addendum: I logged onto the [employer] and reviewed her prescription medication history     Louanna Vanliew M. Dalbert Batman, M.D., FACS General and Minimally Invasive Surgery Breast and Colorectal Surgery  11/19/2017 8:20 AM

## 2017-11-19 NOTE — Anesthesia Postprocedure Evaluation (Signed)
Anesthesia Post Note  Patient: Vanessa Harrison  Procedure(s) Performed: RIGHT BREAST LUMPECTOMY SUBAREOLAR (Right Breast)     Patient location during evaluation: PACU Anesthesia Type: General Level of consciousness: awake Pain management: pain level controlled Vital Signs Assessment: post-procedure vital signs reviewed and stable Respiratory status: spontaneous breathing Cardiovascular status: stable Postop Assessment: able to ambulate Anesthetic complications: yes Anesthetic complication details: PONV   Last Vitals:  Vitals:   11/19/17 0848 11/19/17 0915  BP:  140/74  Pulse: (!) 127 87  Resp: 20 20  Temp:    SpO2: 96% 96%    Last Pain:  Vitals:   11/19/17 0915  TempSrc:   PainSc: 0-No pain   Pain Goal: Patients Stated Pain Goal: 2 (11/19/17 0827)               [place]

## 2017-11-19 NOTE — Interval H&P Note (Signed)
History and Physical Interval Note:  11/19/2017 6:15 AM  Vanessa Harrison  has presented today for surgery, with the diagnosis of right nipple discharge  The various methods of treatment have been discussed with the patient and family. After consideration of risks, benefits and other options for treatment, the patient has consented to  Procedure(s): RIGHT BREAST LUMPECTOMY SUBAREOLAR (Right) as a surgical intervention .  The patient's history has been reviewed, patient examined, no change in status, stable for surgery.  I have reviewed the patient's chart and labs.  Questions were answered to the patient's satisfaction.     Adin Hector

## 2017-11-19 NOTE — Discharge Instructions (Signed)
[facility] °Office Phone Number [phone] ° °BREAST BIOPSY/ PARTIAL MASTECTOMY: POST OP INSTRUCTIONS ° °Always review your discharge instruction sheet given to you by the facility where your surgery was performed. ° °IF YOU HAVE DISABILITY OR FAMILY LEAVE FORMS, YOU MUST BRING THEM TO THE OFFICE FOR PROCESSING.  DO NOT GIVE THEM TO YOUR DOCTOR. ° °1. A prescription for pain medication may be given to you upon discharge.  Take your pain medication as prescribed, if needed.  If narcotic pain medicine is not needed, then you may take acetaminophen (Tylenol) or ibuprofen (Advil) as needed. °2. Take your usually prescribed medications unless otherwise directed °3. If you need a refill on your pain medication, please contact your pharmacy.  They will contact our office to request authorization.  Prescriptions will not be filled after 5pm or on week-ends. °4. You should eat very light the first 24 hours after surgery, such as soup, crackers, pudding, etc.  Resume your normal diet the day after surgery. °5. Most patients will experience some swelling and bruising in the breast.  Ice packs and a good support bra will help.  Swelling and bruising can take several days to resolve.  °6. It is common to experience some constipation if taking pain medication after surgery.  Increasing fluid intake and taking a stool softener will usually help or prevent this problem from occurring.  A mild laxative (Milk of Magnesia or Miralax) should be taken according to package directions if there are no bowel movements after 48 hours. °7. Unless discharge instructions indicate otherwise, you may remove your bandages 24-48 hours after surgery, and you may shower at that time.  You may have steri-strips (small skin tapes) in place directly over the incision.  These strips should be left on the skin for 7-10 days.  If your surgeon used skin glue on the incision, you may shower in 24 hours.  The glue will flake off over the  next 2-3 weeks.  Any sutures or staples will be removed at the office during your follow-up visit. °8. ACTIVITIES:  You may resume regular daily activities (gradually increasing) beginning the next day.  Wearing a good support bra or sports bra minimizes pain and swelling.  You may have sexual intercourse when it is comfortable. °a. You may drive when you no longer are taking prescription pain medication, you can comfortably wear a seatbelt, and you can safely maneuver your car and apply brakes. °b. RETURN TO WORK:  ______________________________________________________________________________________ °9. You should see your doctor in the office for a follow-up appointment approximately two weeks after your surgery.  Your doctor’s nurse will typically make your follow-up appointment when she calls you with your pathology report.  Expect your pathology report 2-3 business days after your surgery.  You may call to check if you do not hear from us after three days. °10. OTHER INSTRUCTIONS: _______________________________________________________________________________________________ _____________________________________________________________________________________________________________________________________ °_____________________________________________________________________________________________________________________________________ °_____________________________________________________________________________________________________________________________________ ° °WHEN TO CALL YOUR DOCTOR: °1. Fever over 101.0 °2. Nausea and/or vomiting. °3. Extreme swelling or bruising. °4. Continued bleeding from incision. °5. Increased pain, redness, or drainage from the incision. ° °The clinic staff is available to answer your questions during regular business hours.  Please don’t hesitate to call and ask to speak to one of the nurses for clinical concerns.  If you have a medical emergency, go to the nearest  emergency room or call 911.  A surgeon from [facility] is always on call at the hospital. ° °For further questions, please visit [email or web address]  °

## 2017-11-19 NOTE — Anesthesia Procedure Notes (Signed)
Procedure Name: LMA Insertion Date/Time: 11/19/2017 7:41 AM Performed by: Renato Shin, CRNA Pre-anesthesia Checklist: Patient identified, Emergency Drugs available, Suction available and Patient being monitored Patient Re-evaluated:Patient Re-evaluated prior to induction Oxygen Delivery Method: Circle system utilized Preoxygenation: Pre-oxygenation with 100% oxygen Induction Type: IV induction LMA: LMA inserted LMA Size: 4.0 Number of attempts: 1 Placement Confirmation: positive ETCO2,  CO2 detector and breath sounds checked- equal and bilateral Tube secured with: Tape Dental Injury: Teeth and Oropharynx as per pre-operative assessment

## 2017-11-20 ENCOUNTER — Encounter (HOSPITAL_COMMUNITY): Payer: Self-pay | Admitting: General Surgery

## 2019-10-18 ENCOUNTER — Encounter (HOSPITAL_COMMUNITY): Payer: Self-pay | Admitting: Psychiatry

## 2019-10-18 ENCOUNTER — Other Ambulatory Visit: Payer: Self-pay

## 2019-10-18 ENCOUNTER — Telehealth (INDEPENDENT_AMBULATORY_CARE_PROVIDER_SITE_OTHER): Payer: No Payment, Other | Admitting: Psychiatry

## 2019-10-18 DIAGNOSIS — F3130 Bipolar disorder, current episode depressed, mild or moderate severity, unspecified: Secondary | ICD-10-CM | POA: Diagnosis not present

## 2019-10-18 MED ORDER — TRAZODONE HCL 50 MG PO TABS: 50.0000 mg | ORAL_TABLET | Freq: Every evening | ORAL | 2 refills | Status: DC | PRN

## 2019-10-18 MED ORDER — HALOPERIDOL 5 MG PO TABS: 5.0000 mg | ORAL_TABLET | Freq: Every day | ORAL | 2 refills | Status: DC

## 2019-10-18 MED ORDER — LITHIUM CARBONATE 300 MG PO CAPS: 600.0000 mg | ORAL_CAPSULE | Freq: Every day | ORAL | 2 refills | Status: DC

## 2019-10-18 MED ORDER — BENZTROPINE MESYLATE 0.5 MG PO TABS: 0.5000 mg | ORAL_TABLET | Freq: Every day | ORAL | 2 refills | Status: DC

## 2019-10-18 MED ORDER — FLUOXETINE HCL 40 MG PO CAPS: 40.0000 mg | ORAL_CAPSULE | Freq: Every day | ORAL | 2 refills | Status: DC
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
## 2020-01-17 ENCOUNTER — Other Ambulatory Visit: Payer: Self-pay

## 2020-01-17 ENCOUNTER — Encounter (HOSPITAL_COMMUNITY): Payer: Self-pay | Admitting: Psychiatry

## 2020-01-17 ENCOUNTER — Telehealth (HOSPITAL_COMMUNITY): Payer: No Payment, Other | Admitting: Psychiatry

## 2020-01-17 ENCOUNTER — Telehealth (INDEPENDENT_AMBULATORY_CARE_PROVIDER_SITE_OTHER): Payer: No Payment, Other | Admitting: Psychiatry

## 2020-01-17 DIAGNOSIS — F3130 Bipolar disorder, current episode depressed, mild or moderate severity, unspecified: Secondary | ICD-10-CM | POA: Diagnosis not present

## 2020-01-17 MED ORDER — HALOPERIDOL 5 MG PO TABS: 5.0000 mg | ORAL_TABLET | Freq: Every day | ORAL | 2 refills | Status: DC

## 2020-01-17 MED ORDER — TRAZODONE HCL 50 MG PO TABS: 50.0000 mg | ORAL_TABLET | Freq: Every evening | ORAL | 2 refills | Status: DC | PRN

## 2020-01-17 MED ORDER — FLUOXETINE HCL 40 MG PO CAPS: 40.0000 mg | ORAL_CAPSULE | Freq: Every day | ORAL | 2 refills | Status: DC

## 2020-01-17 MED ORDER — LITHIUM CARBONATE 300 MG PO CAPS: 600.0000 mg | ORAL_CAPSULE | Freq: Every day | ORAL | 2 refills | Status: DC

## 2020-01-17 MED ORDER — BENZTROPINE MESYLATE 0.5 MG PO TABS: 0.5000 mg | ORAL_TABLET | Freq: Every day | ORAL | 2 refills | Status: DC

## 2020-01-17 NOTE — Progress Notes (Signed)
BH MD/PA/NP OP Progress Note Virtual Visit via Video Note  I connected with Vanessa Harrison on 01/17/20 at  3:30 PM EST by a video enabled telemedicine application and verified that I am speaking with the correct person using two identifiers.  Location: Patient: Home Provider: Clinic   I discussed the limitations of evaluation and management by telemedicine and the availability of in person appointments. The patient expressed understanding and agreed to proceed.  I provided 30 minutes of non-face-to-face time during this encounter.    01/17/2020 3:35 PM Vanessa Harrison  MRN:  [id number]  Chief Complaint: "Things have gotten better thank God"  HPI: 44 year old female seen today for follow up psychiatric evaluation. Provider utilized Spanish speaking interpreter as patient speaks Spanish.  She has a psychiatric history of anxiety and bipolar disorder.  She is vurrently managed on trazodone 25 mg to 50 mg nightly as needed, lithium 600 mg nightly, Haldol 5 mg nightly, Prozac 40 mg daily, and hydroxyzine 0.5 mg daily.  Today she reported that her medications are effective  in managing her psychiatric conditions.  Today she is well groomed, pleasant, cooperative, engaged in conversation, and maintained eye contact. She informed provider that her anxiety and depression has improved since her last visit.  Provider conducted a GAD-7 and patient scored an 11.  She informed provider that at times she feels nervous, restless, and worries about driving (noting that she fears she would have a car accident).  Provider also conducted a PHQ-9 and patient scored a 6. Today she denied SI/HI/VAH, paranoia, or mania.  Patient notes her last manic episode was a year and a half ago.  She informed provider that when she is manic she is irritable, grandiose, and is mean to her family members.  She informed provider that she feels lithium is effective in managing her mood at this time.  Since  starting trazodone she noted that hersleep has improved.  She now sleeps between 6 to 7 hours nightly.  Patient informed provider that she is worried about lumps she noticed in her breast.  She informed provider that at times she think she should get it checked out however fears what it might be.  Provider recommended that [facility] an [facility].  At this time she noted that she does not want to seek treatment because of fear of the unknown.  No medication changes made today.  Patient agreeable to continue all medications as prescribed.  She will follow up with provider in 3 months for further evaluation.  No other concerns noted at this time.   Visit Diagnosis:    ICD-10-CM   1. Bipolar I disorder, most recent episode depressed (HCC)  F31.30 benztropine (COGENTIN) 0.5 MG tablet    FLUoxetine (PROZAC) 40 MG capsule    haloperidol (HALDOL) 5 MG tablet    lithium carbonate 300 MG capsule    traZODone (DESYREL) 50 MG tablet    Past Psychiatric History: nxiety and bipolar disorder  Past Medical History:  Past Medical History:  Diagnosis Date  . Anxiety   . Bipolar 2 disorder ([place])   . Discharge from right nipple 11/19/2017  . Hypercholesteremia     Past Surgical History:  Procedure Laterality Date  . APPENDECTOMY  09/2012   lap appy  . BREAST LUMPECTOMY Right 11/19/2017   Procedure: RIGHT BREAST LUMPECTOMY SUBAREOLAR;  Surgeon: Fanny Skates, MD;  Location: [place];  Service: General;  Laterality: Right;  . CESAREAN SECTION    .  LAPAROSCOPIC APPENDECTOMY N/A 09/24/2012   Procedure: APPENDECTOMY LAPAROSCOPIC;  Surgeon: Gwenyth Ober, MD;  Location: [place];  Service: General;  Laterality: N/A;    Family Psychiatric History: Denies  Family History:  Family History  Problem Relation Age of Onset  . Diabetes Mother   . Hypertension Mother   . Diabetes Sister   . Hypertension Sister   . Cancer Sister 13       breast  . Hypertension Brother      Social History:  Social History   Socioeconomic History  . Marital status: Married    Spouse name: Not on file  . Number of children: Not on file  . Years of education: Not on file  . Highest education level: Not on file  Occupational History  . Not on file  Tobacco Use  . Smoking status: Never Smoker  . Smokeless tobacco: Never Used  Vaping Use  . Vaping Use: Never used  Substance and Sexual Activity  . Alcohol use: No  . Drug use: No  . Sexual activity: Yes    Birth control/protection: Coitus interruptus  Other Topics Concern  . Not on file  Social History Narrative  . Not on file   Social Determinants of Health   Financial Resource Strain:   . Difficulty of Paying Living Expenses: Not on file  Food Insecurity:   . Worried About Charity fundraiser in the Last Year: Not on file  . Ran Out of Food in the Last Year: Not on file  Transportation Needs:   . Lack of Transportation (Medical): Not on file  . Lack of Transportation (Non-Medical): Not on file  Physical Activity:   . Days of Exercise per Week: Not on file  . Minutes of Exercise per Session: Not on file  Stress:   . Feeling of Stress : Not on file  Social Connections:   . Frequency of Communication with Friends and Family: Not on file  . Frequency of Social Gatherings with Friends and Family: Not on file  . Attends Religious Services: Not on file  . Active Member of Clubs or Organizations: Not on file  . Attends Archivist Meetings: Not on file  . Marital Status: Not on file    Allergies: No Known Allergies  Metabolic Disorder Labs: No results found for: HGBA1C, MPG No results found for: PROLACTIN Lab Results  Component Value Date   CHOL 232 (H) 03/26/2010   TRIG 224 (H) 03/26/2010   HDL 31 (L) 03/26/2010   CHOLHDL 7.5 Ratio 03/26/2010   VLDL 45 (H) 03/26/2010   LDLCALC 156 (H) 03/26/2010   LDLCALC 231 (H) 11/22/2009   Lab Results  Component Value Date   TSH 4.344 02/23/2008    TSH 3.595 08/02/2007    Therapeutic Level Labs: Lab Results  Component Value Date   LITHIUM 0.62 (L) 09/24/2012   LITHIUM 0.54 (L) 11/22/2009   No results found for: VALPROATE No components found for:  CBMZ  Current Medications: Current Outpatient Medications  Medication Sig Dispense Refill  . atorvastatin (LIPITOR) 80 MG tablet Take 80 mg by mouth daily.    . benztropine (COGENTIN) 0.5 MG tablet Take 1 tablet (0.5 mg total) by mouth daily. 30 tablet 2  . FLUoxetine (PROZAC) 40 MG capsule Take 1 capsule (40 mg total) by mouth daily. 30 capsule 2  . haloperidol (HALDOL) 5 MG tablet Take 1 tablet (5 mg total) by mouth at bedtime. 30 tablet 2  . HYDROcodone-acetaminophen (NORCO) 5-325  MG tablet Take 1-2 tablets by mouth every 6 (six) hours as needed for moderate pain or severe pain. 20 tablet 0  . lithium carbonate 300 MG capsule Take 2 capsules (600 mg total) by mouth at bedtime. 60 capsule 2  . traZODone (DESYREL) 50 MG tablet Take 1 tablet (50 mg total) by mouth at bedtime as needed for sleep. 30 tablet 2   No current facility-administered medications for this visit.     Musculoskeletal: Strength & Muscle Tone: Unable to assess due to telehealth visit [place]: Unable to assess due to telehealth visit Patient leans: N/A  Psychiatric Specialty Exam: Review of Systems  There were no vitals taken for this visit.There is no height or weight on file to calculate BMI.  General Appearance: Well Groomed  Eye Contact:  Good  Speech:  Clear and Coherent and Normal Rate  Volume:  Normal  Mood:  Euthymic  Affect:  Appropriate and Congruent  Thought Process:  Coherent, Goal Directed and Linear  Orientation:  Full (Time, Place, and Person)  Thought Content: WDL and Logical   Suicidal Thoughts:  No  Homicidal Thoughts:  No  Memory:  Immediate;   Good Recent;   Good Remote;   Good  Judgement:  Good  Insight:  Good  Psychomotor Activity:  Normal  Concentration:   Concentration: Good and Attention Span: Good  Recall:  Good  Fund of Knowledge: Good  Language: Good  Akathisia:  No  Handed:  Right  AIMS (if indicated): Not done  Assets:  Communication Skills Desire for Improvement Financial Resources/Insurance Housing Intimacy Social Support  ADL's:  Intact  Cognition: WNL  Sleep:  Good   Screenings: GAD-7     [facility] Visit [facility] 01/17/2020 in [facility]  Total GAD-7 Score 11    PHQ2-9     Video Visit from 01/17/2020 in [facility]  PHQ-2 Total Score 2  PHQ-9 Total Score 6       Assessment and Plan: Patient informed provider that she is sleeping better and reported that her anxiety and depression has improved. No medication changes made today. Patient is agreeable to continue all medications as prescribed. No other concerns at this time.  1. Bipolar I disorder, most recent episode depressed ([place])  Continue- benztropine (COGENTIN) 0.5 MG tablet; Take 1 tablet (0.5 mg total) by mouth daily.  Dispense: 30 tablet; Refill: 2 Continue- FLUoxetine (PROZAC) 40 MG capsule; Take 1 capsule (40 mg total) by mouth daily.  Dispense: 30 capsule; Refill: 2 Continue- haloperidol (HALDOL) 5 MG tablet; Take 1 tablet (5 mg total) by mouth at bedtime.  Dispense: 30 tablet; Refill: 2 Continue- lithium carbonate 300 MG capsule; Take 2 capsules (600 mg total) by mouth at bedtime.  Dispense: 60 capsule; Refill: 2 Continue- traZODone (DESYREL) 50 MG tablet; Take 1 tablet (50 mg total) by mouth at bedtime as needed for sleep.  Dispense: 30 tablet; Refill: 2   Follow up in 3 months Vanessa Slaughter, NP 01/17/2020, 3:35 PM

## 2020-01-24 ENCOUNTER — Encounter (HOSPITAL_COMMUNITY): Payer: Self-pay | Admitting: Psychiatry

## 2020-04-16 ENCOUNTER — Encounter (HOSPITAL_COMMUNITY): Payer: Self-pay | Admitting: Psychiatry

## 2020-04-16 ENCOUNTER — Telehealth (INDEPENDENT_AMBULATORY_CARE_PROVIDER_SITE_OTHER): Payer: No Payment, Other | Admitting: Psychiatry

## 2020-04-16 ENCOUNTER — Other Ambulatory Visit: Payer: Self-pay

## 2020-04-16 DIAGNOSIS — F3130 Bipolar disorder, current episode depressed, mild or moderate severity, unspecified: Secondary | ICD-10-CM

## 2020-04-16 DIAGNOSIS — R413 Other amnesia: Secondary | ICD-10-CM | POA: Diagnosis not present

## 2020-04-16 MED ORDER — FLUOXETINE HCL 40 MG PO CAPS: 40.0000 mg | ORAL_CAPSULE | Freq: Every day | ORAL | 2 refills | Status: DC

## 2020-04-16 MED ORDER — BENZTROPINE MESYLATE 0.5 MG PO TABS: 0.5000 mg | ORAL_TABLET | Freq: Every day | ORAL | 2 refills | Status: DC

## 2020-04-16 MED ORDER — LITHIUM CARBONATE 300 MG PO CAPS: 600.0000 mg | ORAL_CAPSULE | Freq: Every day | ORAL | 2 refills | Status: DC

## 2020-04-16 MED ORDER — TRAZODONE HCL 50 MG PO TABS: 50.0000 mg | ORAL_TABLET | Freq: Every evening | ORAL | 2 refills | Status: DC | PRN

## 2020-04-16 MED ORDER — HALOPERIDOL 5 MG PO TABS: 5.0000 mg | ORAL_TABLET | Freq: Every day | ORAL | 2 refills | Status: DC

## 2020-04-16 NOTE — Progress Notes (Signed)
BH MD/PA/NP OP Progress Note Virtual Visit via Video Note  I connected with Vanessa Harrison on 04/16/20 at  2:30 PM EST by a video enabled telemedicine application and verified that I am speaking with the correct person using two identifiers.  Location: Patient: Home Provider: Clinic   I discussed the limitations of evaluation and management by telemedicine and the availability of in person appointments. The patient expressed understanding and agreed to proceed.  I provided 30 minutes of non-face-to-face time during this encounter.    04/16/2020 3:13 PM Vanessa Harrison  MRN:  [id number]  Chief Complaint: "I've been very good thank God but I'm concerned about my memory"  HPI: 45 year old female seen today for follow up psychiatric evaluation. Provider utilized Spanish speaking interpreter as patient speaks Spanish.  She has a psychiatric history of anxiety and bipolar disorder.  She is currently managed on trazodone 25 mg to 50 mg nightly as needed, lithium 600 mg nightly, Haldol 5 mg nightly, Prozac 40 mg daily, and hydroxyzine 0.5 mg daily.  Today she reported that her medications are effective  in managing her psychiatric conditions.  He however notes that she is concerned about her memory.  Today she is well groomed, pleasant, cooperative, engaged in conversation, and maintained eye contact. She that he occasionally has anxiety and depression but notes that she is able to cope with it.  Today provider conducted a GAD-7 and patient scored a 6, at her last visit she scored a 11.  She informed provider that at times worries about what will happen after she or her husband dies.  Provider also conducted a PHQ-9 and patient scored a 10.  Today she denied SI/HI/VAH, paranoia, or mania.  She notes at times her sleep is disturbed because she has to get up to go to the restroom 3 times a night.  She endorses sleeping approximately 6 hours nightly.  She endorses having a good  appetite.  Patient informed provider that she is worried about her memory.  She notes that she often forgets conversations she had with individuals, housework that needs to be done such as cleaning the restroom, and notes that her husband is concerned because she is forgetful and distractible at times.  She informed provider that she fears that she may be developing Alzheimer's and wants further assessment.  Provider referred patient to [facility] neurology for further assessment.  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  She informed Probation officer at times she worries that she is not doing a good job and often is confused about what is expected due to having a language barrier.   No medication changes made today.  Patient agreeable to continue all medications as prescribed.  She will follow up with provider in 3 months for further evaluation.  Patient will follow up at [facility] neurology for further assessment.  No other concerns noted at this time.   Visit Diagnosis:    ICD-10-CM   1. Memory impairment  R41.3 Ambulatory referral to Neurology  2. Bipolar I disorder, most recent episode depressed (HCC)  F31.30 benztropine (COGENTIN) 0.5 MG tablet    FLUoxetine (PROZAC) 40 MG capsule    haloperidol (HALDOL) 5 MG tablet    lithium carbonate 300 MG capsule    traZODone (DESYREL) 50 MG tablet    Past Psychiatric History: nxiety and bipolar disorder  Past Medical History:  Past Medical History:  Diagnosis Date  . Anxiety   . Bipolar 2 disorder ([place])   . Discharge  from right nipple 11/19/2017  . Hypercholesteremia     Past Surgical History:  Procedure Laterality Date  . APPENDECTOMY  09/2012   lap appy  . BREAST LUMPECTOMY Right 11/19/2017   Procedure: RIGHT BREAST LUMPECTOMY SUBAREOLAR;  Surgeon: Fanny Skates, MD;  Location: [place];  Service: General;  Laterality: Right;  . CESAREAN SECTION    . LAPAROSCOPIC APPENDECTOMY N/A 09/24/2012   Procedure: APPENDECTOMY LAPAROSCOPIC;   Surgeon: Gwenyth Ober, MD;  Location: [place];  Service: General;  Laterality: N/A;    Family Psychiatric History: Denies  Family History:  Family History  Problem Relation Age of Onset  . Diabetes Mother   . Hypertension Mother   . Diabetes Sister   . Hypertension Sister   . Cancer Sister 86       breast  . Hypertension Brother     Social History:  Social History   Socioeconomic History  . Marital status: Single    Spouse name: Not on file  . Number of children: Not on file  . Years of education: Not on file  . Highest education level: Not on file  Occupational History  . Not on file  Tobacco Use  . Smoking status: Never Smoker  . Smokeless tobacco: Never Used  Vaping Use  . Vaping Use: Never used  Substance and Sexual Activity  . Alcohol use: No  . Drug use: No  . Sexual activity: Yes    Birth control/protection: Coitus interruptus  Other Topics Concern  . Not on file  Social History Narrative   ** Merged History Encounter **       Social Determinants of Health   Financial Resource Strain: Not on file  Food Insecurity: Not on file  Transportation Needs: Not on file  Physical Activity: Not on file  Stress: Not on file  Social Connections: Not on file    Allergies: No Known Allergies  Metabolic Disorder Labs: No results found for: HGBA1C, MPG No results found for: PROLACTIN Lab Results  Component Value Date   CHOL 232 (H) 03/26/2010   TRIG 224 (H) 03/26/2010   HDL 31 (L) 03/26/2010   CHOLHDL 7.5 Ratio 03/26/2010   VLDL 45 (H) 03/26/2010   LDLCALC 156 (H) 03/26/2010   LDLCALC 231 (H) 11/22/2009   Lab Results  Component Value Date   TSH 4.344 02/23/2008   TSH 3.595 08/02/2007    Therapeutic Level Labs: Lab Results  Component Value Date   LITHIUM 0.62 (L) 09/24/2012   LITHIUM 0.54 (L) 11/22/2009   No results found for: VALPROATE No components found for:  CBMZ  Current Medications: Current Outpatient Medications  Medication Sig  Dispense Refill  . atorvastatin (LIPITOR) 80 MG tablet Take 80 mg by mouth daily.    . benztropine (COGENTIN) 0.5 MG tablet Take 1 tablet (0.5 mg total) by mouth daily. 30 tablet 2  . FLUoxetine (PROZAC) 40 MG capsule Take 1 capsule (40 mg total) by mouth daily. 30 capsule 2  . haloperidol (HALDOL) 5 MG tablet Take 1 tablet (5 mg total) by mouth at bedtime. 30 tablet 2  . HYDROcodone-acetaminophen (NORCO) 5-325 MG tablet Take 1-2 tablets by mouth every 6 (six) hours as needed for moderate pain or severe pain. 20 tablet 0  . lithium carbonate 300 MG capsule Take 2 capsules (600 mg total) by mouth at bedtime. 60 capsule 2  . traZODone (DESYREL) 50 MG tablet Take 1 tablet (50 mg total) by mouth at bedtime as needed for sleep. 30 tablet  2   No current facility-administered medications for this visit.     Musculoskeletal: Strength & Muscle Tone: Unable to assess due to telehealth visit [place]: Unable to assess due to telehealth visit Patient leans: N/A  Psychiatric Specialty Exam: Review of Systems  There were no vitals taken for this visit.There is no height or weight on file to calculate BMI.  General Appearance: Well Groomed  Eye Contact:  Good  Speech:  Clear and Coherent and Normal Rate  Volume:  Normal  Mood:  Euthymic  Affect:  Appropriate and Congruent  Thought Process:  Coherent, Goal Directed and Linear  Orientation:  Full (Time, Place, and Person)  Thought Content: WDL and Logical   Suicidal Thoughts:  No  Homicidal Thoughts:  No  Memory:  Immediate;   Good Recent;   Good Remote;   Good  Judgement:  Good  Insight:  Good  Psychomotor Activity:  Normal  Concentration:  Concentration: Good and Attention Span: Good  Recall:  Good  Fund of Knowledge: Good  Language: Good  Akathisia:  No  Handed:  Right  AIMS (if indicated): Not done  Assets:  Communication Skills Desire for Improvement Financial Resources/Insurance Housing Intimacy Social Support  ADL's:   Intact  Cognition: WNL  Sleep:  Good   Screenings: GAD-7   Flowsheet Row Video Visit from 04/16/2020 in [facility] Video Visit from 01/17/2020 in [facility]  Total GAD-7 Score 6 11    PHQ2-9   Flowsheet Row Video Visit from 04/16/2020 in [facility] Video Visit from 01/17/2020 in [facility]  PHQ-2 Total Score 0 2  PHQ-9 Total Score 10 6    Flowsheet Row Video Visit from 04/16/2020 in [place] Error: Q3, 4, or 5 should not be populated when Q2 is No       Assessment and Plan: Patient notes she occasionally has symptoms of anxiety and depression however reports that her medications are effective in controlling the symptoms.  She however informed provider that she feels that she may be developing Alzheimer's disease.  Provider referred patient for the patient to [facility]rology for further assessment.  No medication changes made today.  Patient will continue medication as prescribed.  1. Bipolar I disorder, most recent episode depressed ([place])  Continue- benztropine (COGENTIN) 0.5 MG tablet; Take 1 tablet (0.5 mg total) by mouth daily.  Dispense: 30 tablet; Refill: 2 Continue- FLUoxetine (PROZAC) 40 MG capsule; Take 1 capsule (40 mg total) by mouth daily.  Dispense: 30 capsule; Refill: 2 Continue- haloperidol (HALDOL) 5 MG tablet; Take 1 tablet (5 mg total) by mouth at bedtime.  Dispense: 30 tablet; Refill: 2 Continue- lithium carbonate 300 MG capsule; Take 2 capsules (600 mg total) by mouth at bedtime.  Dispense: 60 capsule; Refill: 2 Continue- traZODone (DESYREL) 50 MG tablet; Take 1 tablet (50 mg total) by mouth at bedtime as needed for sleep.  Dispense: 30 tablet; Refill: 2   2. Memory impairment  - Ambulatory referral to Neurology    Follow up in 3 months Salley Slaughter, NP 04/16/2020, 3:13 PM

## 2020-07-10 ENCOUNTER — Other Ambulatory Visit: Payer: Self-pay

## 2020-07-10 ENCOUNTER — Encounter (HOSPITAL_COMMUNITY): Payer: Self-pay | Admitting: Psychiatry

## 2020-07-10 ENCOUNTER — Telehealth (INDEPENDENT_AMBULATORY_CARE_PROVIDER_SITE_OTHER): Payer: No Payment, Other | Admitting: Psychiatry

## 2020-07-10 DIAGNOSIS — F3130 Bipolar disorder, current episode depressed, mild or moderate severity, unspecified: Secondary | ICD-10-CM

## 2020-07-10 MED ORDER — HALOPERIDOL 5 MG PO TABS: 5.0000 mg | ORAL_TABLET | Freq: Every day | ORAL | 2 refills | Status: DC

## 2020-07-10 MED ORDER — BENZTROPINE MESYLATE 0.5 MG PO TABS: 0.5000 mg | ORAL_TABLET | Freq: Every day | ORAL | 2 refills | Status: DC

## 2020-07-10 MED ORDER — FLUOXETINE HCL 40 MG PO CAPS: 40.0000 mg | ORAL_CAPSULE | Freq: Every day | ORAL | 2 refills | Status: DC

## 2020-07-10 MED ORDER — LITHIUM CARBONATE 300 MG PO CAPS: 600.0000 mg | ORAL_CAPSULE | Freq: Every day | ORAL | 2 refills | Status: DC

## 2020-07-10 MED ORDER — TRAZODONE HCL 50 MG PO TABS
50.0000 mg | ORAL_TABLET | Freq: Every evening | ORAL | 2 refills | Status: DC | PRN
Start: 2020-07-10 — End: 2020-10-09

## 2020-07-10 NOTE — Progress Notes (Signed)
[facility] MD/PA/NP OP Progress Note Virtual Visit via Video Note  I connected with Vanessa Harrison on 07/10/20 at  2:00 PM EDT by a video enabled telemedicine application and verified that I am speaking with the correct person using two identifiers.  Location: Patient: Home Provider: Clinic   I discussed the limitations of evaluation and management by telemedicine and the availability of in person appointments. The patient expressed understanding and agreed to proceed.  I provided 30 minutes of non-face-to-face time during this encounter.    07/10/2020 3:32 PM Vanessa Harrison  MRN:  [id number]  Chief Complaint: "I've have seen the specialist"  HPI: 45 year old female seen today for follow up psychiatric evaluation. Provider utilized Spanish speaking interpreter as patient speaks Spanish.  She has a psychiatric history of anxiety and bipolar disorder.  She is currently managed on trazodone 25 mg to 50 mg nightly as needed, lithium 600 mg nightly, Haldol 5 mg nightly, Prozac 40 mg daily, and hydroxyzine 0.5 mg daily.  Today she reported that her medications are effective  in managing her psychiatric conditions.   Today she is well groomed, pleasant, cooperative, engaged in conversation, and maintained eye contact. She informed Probation officer that over all she is doing well but continues to be concerned about her memory. She notes that she was called by [facility] but missed the call because she was at work.  She notes she continues to be forgetful and is concerned. Provider encouraged patient to call [facility] back and she notes that she would   Today provider conducted a GAD-7 and patient scored a 2, at her last visit she scored a 6.   Provider also conducted a PHQ-9 and patient scored a 16, at her last visit she scored a 10.  Today she denied SI/HI/VAH, paranoia, or mania.  She notes at times her sleep is disturbed because she has to get up to go to the  restroom 3 times a night.  Provider encouraged patient to follow up with her PCP and referred her to [facility] and wellness.   Provider asked patient if she wanted her Trazodone increased to help manage her sleep and depression but at this time she notes that she does not want her medication adjusted. No medication changes made today. Patient agreeable to continue medications as prescribed. No other concerns noted at this time.    Visit Diagnosis:    ICD-10-CM   1. Bipolar I disorder, most recent episode depressed (HCC)  F31.30 traZODone (DESYREL) 50 MG tablet    benztropine (COGENTIN) 0.5 MG tablet    FLUoxetine (PROZAC) 40 MG capsule    haloperidol (HALDOL) 5 MG tablet    lithium carbonate 300 MG capsule    Past Psychiatric History: Anxiety and bipolar disorder  Past Medical History:  Past Medical History:  Diagnosis Date  . Anxiety   . Bipolar 2 disorder ([place])   . Discharge from right nipple 11/19/2017  . Hypercholesteremia     Past Surgical History:  Procedure Laterality Date  . APPENDECTOMY  09/2012   lap appy  . BREAST LUMPECTOMY Right 11/19/2017   Procedure: RIGHT BREAST LUMPECTOMY SUBAREOLAR;  Surgeon: Fanny Skates, MD;  Location: [facility];  Service: General;  Laterality: Right;  . CESAREAN SECTION    . LAPAROSCOPIC APPENDECTOMY N/A 09/24/2012   Procedure: APPENDECTOMY LAPAROSCOPIC;  Surgeon: Gwenyth Ober, MD;  Location: [facility];  Service: General;  Laterality: N/A;    Family Psychiatric History: Denies  Family History:  Family History  Problem Relation Age of Onset  . Diabetes Mother   . Hypertension Mother   . Diabetes Sister   . Hypertension Sister   . Cancer Sister 56       breast  . Hypertension Brother     Social History:  Social History   Socioeconomic History  . Marital status: Married    Spouse name: Not on file  . Number of children: Not on file  . Years of education: Not on file  . Highest education level: Not on file  Occupational  History  . Not on file  Tobacco Use  . Smoking status: Never Smoker  . Smokeless tobacco: Never Used  Vaping Use  . Vaping Use: Never used  Substance and Sexual Activity  . Alcohol use: No  . Drug use: No  . Sexual activity: Yes    Birth control/protection: Coitus interruptus  Other Topics Concern  . Not on file  Social History Narrative   ** Merged History Encounter **       Social Determinants of Health   Financial Resource Strain: Not on file  Food Insecurity: Not on file  Transportation Needs: Not on file  Physical Activity: Not on file  Stress: Not on file  Social Connections: Not on file    Allergies: No Known Allergies  Metabolic Disorder Labs: No results found for: HGBA1C, MPG No results found for: PROLACTIN Lab Results  Component Value Date   CHOL 232 (H) 03/26/2010   TRIG 224 (H) 03/26/2010   HDL 31 (L) 03/26/2010   CHOLHDL 7.5 Ratio 03/26/2010   VLDL 45 (H) 03/26/2010   LDLCALC 156 (H) 03/26/2010   LDLCALC 231 (H) 11/22/2009   Lab Results  Component Value Date   TSH 4.344 02/23/2008   TSH 3.595 08/02/2007    Therapeutic Level Labs: Lab Results  Component Value Date   LITHIUM 0.62 (L) 09/24/2012   LITHIUM 0.54 (L) 11/22/2009   No results found for: VALPROATE No components found for:  CBMZ  Current Medications: Current Outpatient Medications  Medication Sig Dispense Refill  . atorvastatin (LIPITOR) 80 MG tablet Take 80 mg by mouth daily.    . benztropine (COGENTIN) 0.5 MG tablet Take 1 tablet (0.5 mg total) by mouth daily. 30 tablet 2  . FLUoxetine (PROZAC) 40 MG capsule Take 1 capsule (40 mg total) by mouth daily. 30 capsule 2  . haloperidol (HALDOL) 5 MG tablet Take 1 tablet (5 mg total) by mouth at bedtime. 30 tablet 2  . HYDROcodone-acetaminophen (NORCO) 5-325 MG tablet Take 1-2 tablets by mouth every 6 (six) hours as needed for moderate pain or severe pain. 20 tablet 0  . lithium carbonate 300 MG capsule Take 2 capsules (600 mg total) by  mouth at bedtime. 60 capsule 2  . traZODone (DESYREL) 50 MG tablet Take 1 tablet (50 mg total) by mouth at bedtime as needed for sleep. 30 tablet 2   No current facility-administered medications for this visit.     Musculoskeletal: Strength & Muscle Tone: Unable to assess due to telehealth visit [place]: Unable to assess due to telehealth visit Patient leans: N/A  Psychiatric Specialty Exam: Review of Systems  There were no vitals taken for this visit.There is no height or weight on file to calculate BMI.  General Appearance: Well Groomed  Eye Contact:  Good  Speech:  Clear and Coherent and Normal Rate  Volume:  Normal  Mood:  Euthymic and reports occassional depression however notes she  is able to cope with it  Affect:  Appropriate and Congruent  Thought Process:  Coherent, Goal Directed and Linear  Orientation:  Full (Time, Place, and Person)  Thought Content: WDL and Logical   Suicidal Thoughts:  No  Homicidal Thoughts:  No  Memory:  Immediate;   Good Recent;   Good Remote;   Good  Judgement:  Good  Insight:  Good  Psychomotor Activity:  Normal  Concentration:  Concentration: Good and Attention Span: Good  Recall:  Good  Fund of Knowledge: Good  Language: Good  Akathisia:  No  Handed:  Right  AIMS (if indicated): Not done  Assets:  Communication Skills Desire for Improvement Financial Resources/Insurance Housing Intimacy Social Support  ADL's:  Intact  Cognition: WNL  Sleep:  Good   Screenings: GAD-7   Flowsheet Row Video Visit from 07/10/2020 in [facility] Video Visit from 04/16/2020 in [facility] Video Visit from 01/17/2020 in [facility]  Total GAD-7 Score 2 6 11     PHQ2-9   Flowsheet Row Video Visit from 07/10/2020 in [facility] Video Visit from 04/16/2020 in [facility] Video Visit from 01/17/2020 in [facility]  PHQ-2 Total Score 2 0 2  PHQ-9 Total Score 16 10 6     Flowsheet Row Video Visit from 07/10/2020 in [facility] Video Visit from 04/16/2020 in [place] Error: Q7 should not be populated when Q6 is No Error: Q3, 4, or 5 should not be populated when Q2 is No       Assessment and Plan: Patient notes That her depression and sleep has worsened since her last visit. Provider asked patient if she wanted her Trazodone increased to help manage her sleep and depression but at this time she notes that she does not want her medication adjusted. No medication changes made today. Patient agreeable to continue medications as prescribed  1. Bipolar I disorder, most recent episode depressed ([place])  Continue- benztropine (COGENTIN) 0.5 MG tablet; Take 1 tablet (0.5 mg total) by mouth daily.  Dispense: 30 tablet; Refill: 2 Continue- FLUoxetine (PROZAC) 40 MG capsule; Take 1 capsule (40 mg total) by mouth daily.  Dispense: 30 capsule; Refill: 2 Continue- haloperidol (HALDOL) 5 MG tablet; Take 1 tablet (5 mg total) by mouth at bedtime.  Dispense: 30 tablet; Refill: 2 Continue- lithium carbonate 300 MG capsule; Take 2 capsules (600 mg total) by mouth at bedtime.  Dispense: 60 capsule; Refill: 2 Continue- traZODone (DESYREL) 50 MG tablet; Take 1 tablet (50 mg total) by mouth at bedtime as needed for sleep.  Dispense: 30 tablet; Refill: 2   2. Memory impairment  - Ambulatory referral to Neurology    Follow up in 3 months Salley Slaughter, NP 07/10/2020, 3:32 PM

## 2020-08-02 IMAGING — US ULTRASOUND LEFT BREAST LIMITED
1 series · 7 of 7 positions shown · non-contrast
Comparison: Previous exam(s).

CLINICAL DATA: Patient presents for palpable abnormalities within
the right and left breast.

EXAM:
DIGITAL DIAGNOSTIC BILATERAL MAMMOGRAM WITH CAD AND TOMO
ULTRASOUND BILATERAL BREAST

[Series 1: ultrasound left breast limited · 0.08mm/px · 7 of 7 slices shown]
[im 1/7]
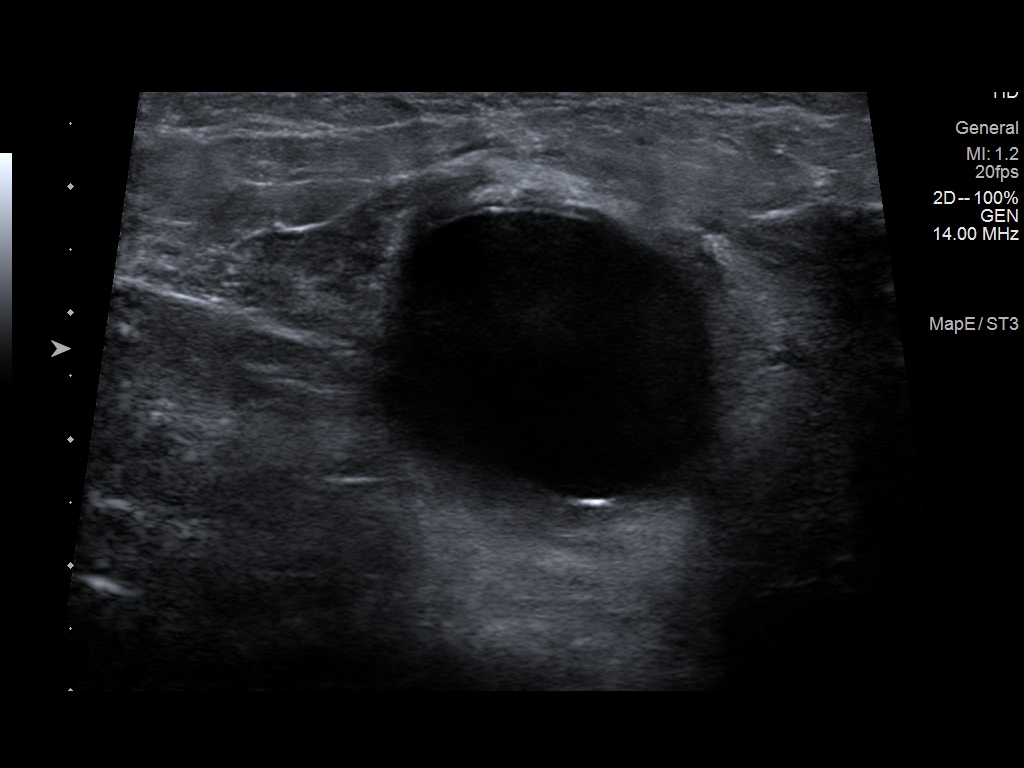
[im 2/7]
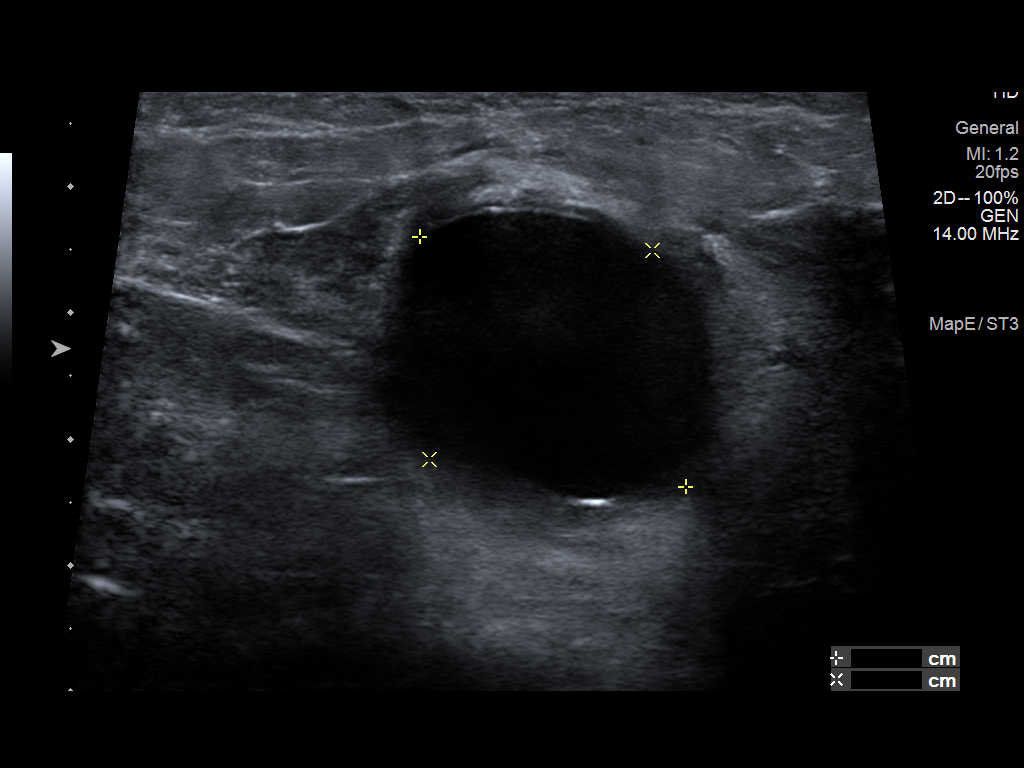
[im 3/7]
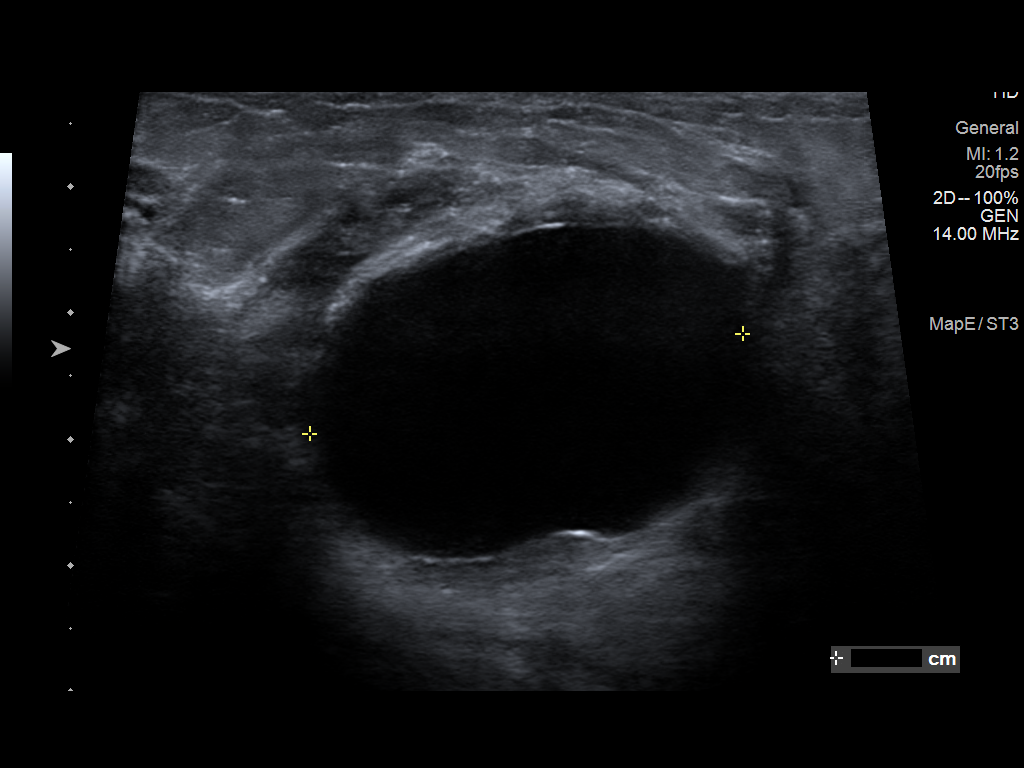
[im 4/7]
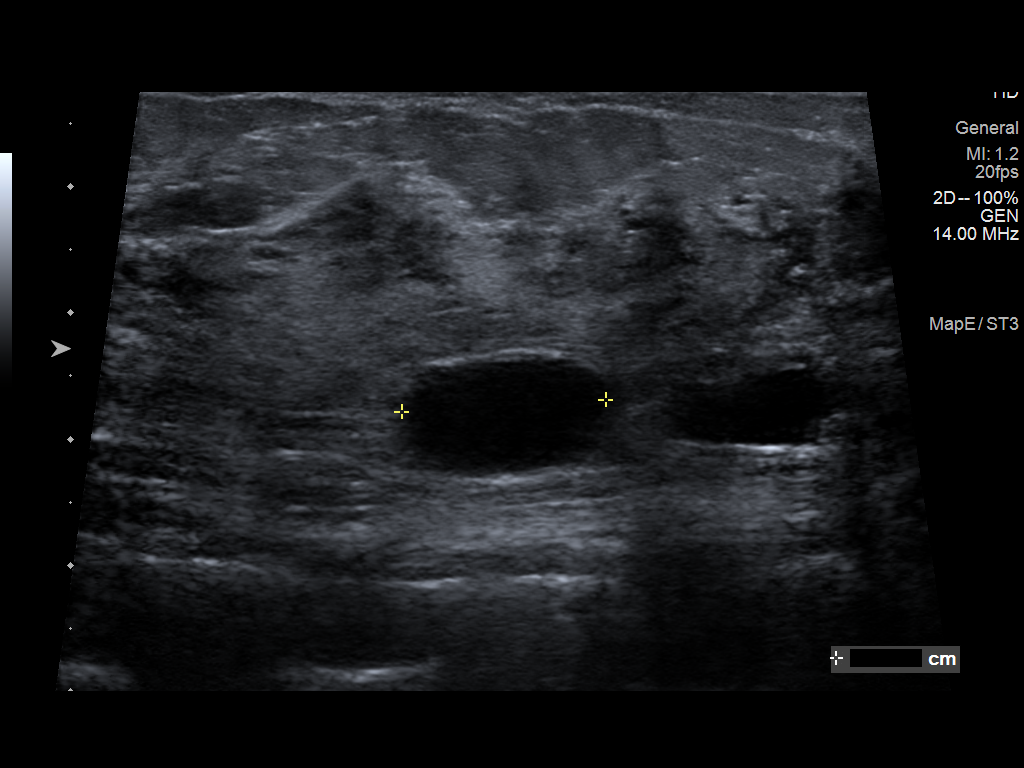
[im 5/7]
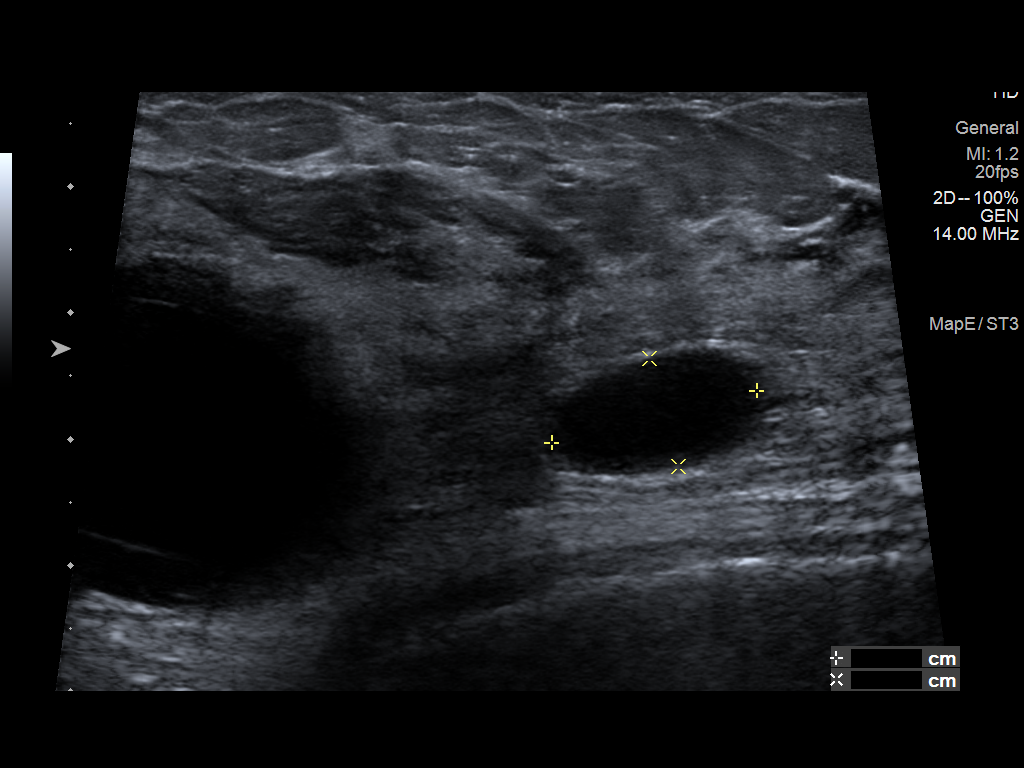
[im 6/7]
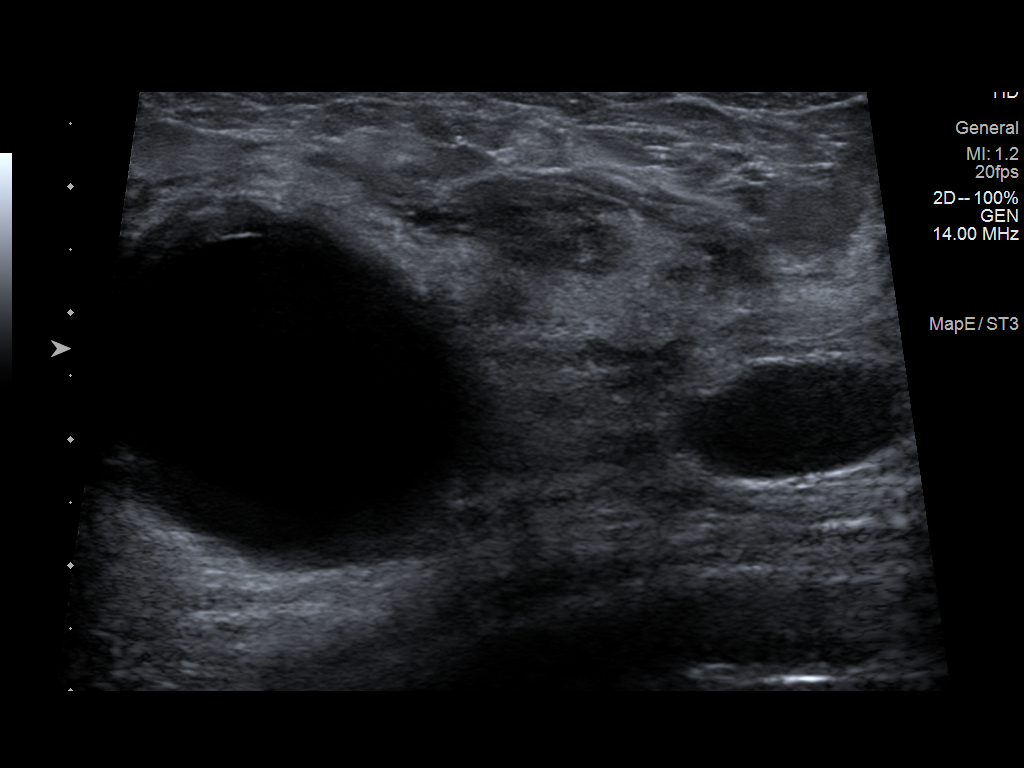
[im 7/7]
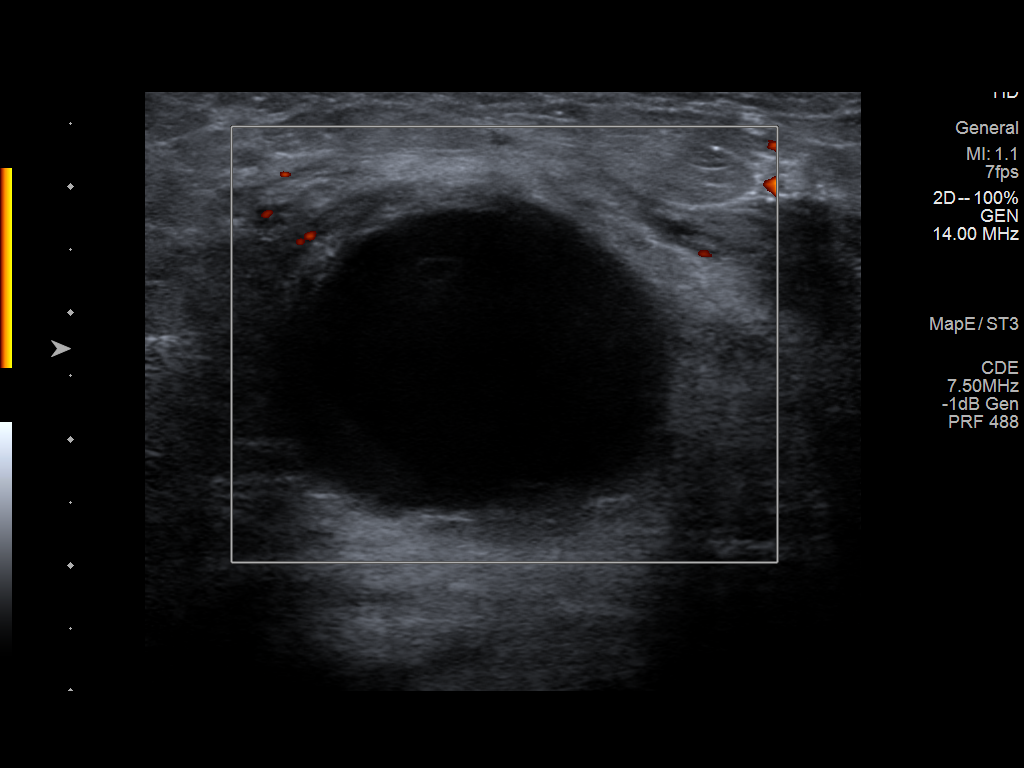

[7 of 7 positions shown; findings below may reference images not displayed]

ACR Breast Density Category c: The breast tissue is heterogeneously
dense, which may obscure small masses.
FINDINGS: Underlying the palpable marker within the right and left breast are
oval circumscribed masses. No additional concerning masses,
calcifications or nonsurgical distortion identified within either
breast.

Mammographic images were processed with CAD.

On physical exam, I palpate a mobile mass within the upper-outer
left breast and within the superior right breast.

Targeted ultrasound is performed, showing a 2.9 x 2.4 x 3.5 cm
simple cyst left breast 2 o'clock position 5 cm from nipple. There
is an adjacent 1.7 x 0.9 x 1.6 cm simple cyst left breast 2 o'clock
position 3 cm from nipple.

Within the right breast 12 o'clock position 5 cm from nipple there
is a 2.4 x 1.5 x 1.9 cm simple cyst.
IMPRESSION: Palpable abnormalities within the right and left breast correspond
with simple cysts.

No mammographic evidence for malignancy.

RECOMMENDATION:
Screening mammogram in one year.(Code:[id number]-FVQ)

Continued clinical evaluation for bilateral palpable abnormalities.

I have discussed the findings and recommendations with the patient.
Results were also provided in writing at the conclusion of the
visit. If applicable, a reminder letter will be sent to the patient
regarding the next appointment.

BI-RADS CATEGORY  2: Benign.

## 2020-08-31 IMAGING — MR MR BILATERAL BREAST WITHOUT AND WITH CONTRAST
6 of 13 series · 19 of 48 positions shown · IV contrast (Yes)
Comparison: 09/21/2017 mammogram, ultrasounds and prior studies

CLINICAL DATA: 42-year-old female with RIGHT nipple discharge
demonstrating ductal epithelial cells with slight cytologic atypia.

LABS:  None performed today
EXAM:
BILATERAL BREAST MRI WITH AND WITHOUT CONTRAST
TECHNIQUE: Multiplanar, multisequence MR images of both breasts were obtained
prior to and following the intravenous administration of 7 ml of
Gadavist
Three-dimensional MR images were rendered by post-processing the
original MR data using the DynaCAD thin client. The 3D MR images are
interpreted and the findings are included in the complete MRI report
below.

[Series 1: (phone_number) · axial · 7.0mm · 1.56mm/px · 1 of 25 slices shown]
[im 1/25]
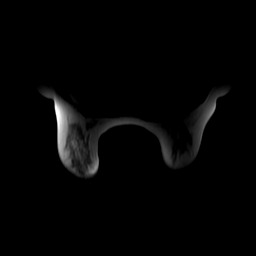

[Series 4: ax ir · axial · 3.0mm · 0.64mm/px · 1 of 83 slices shown]
[im 1/83]
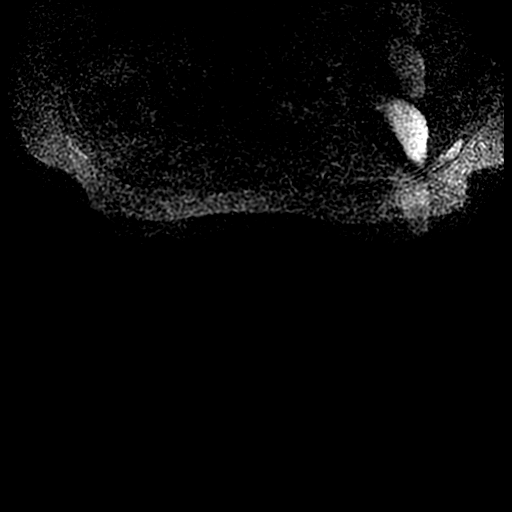

[Series 600: 7 ml's vibrant · axial · 2.0mm · 0.64mm/px · z∈[-154,+93]mm · 5 of 248 slices shown]
[im 1/248]
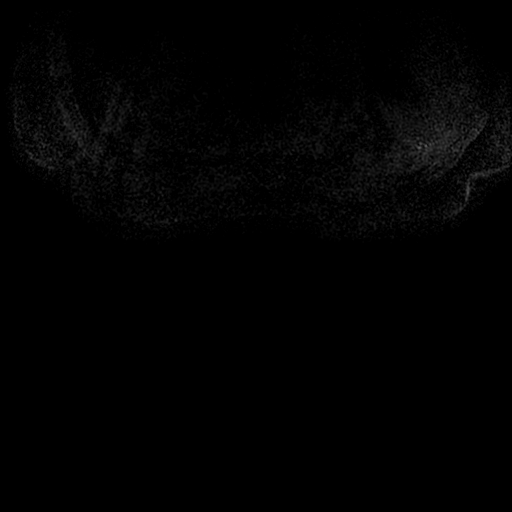
[im 62/248]
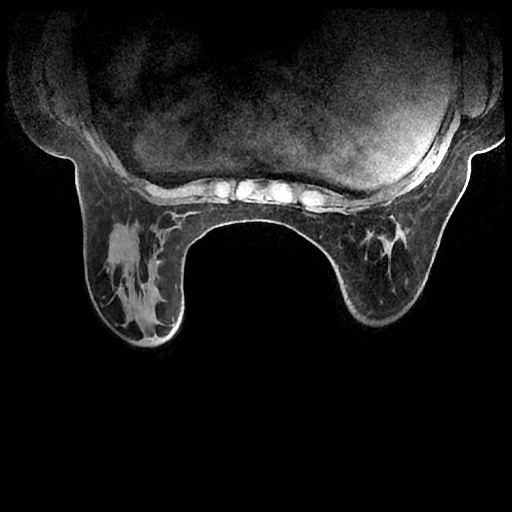
[im 124/248]
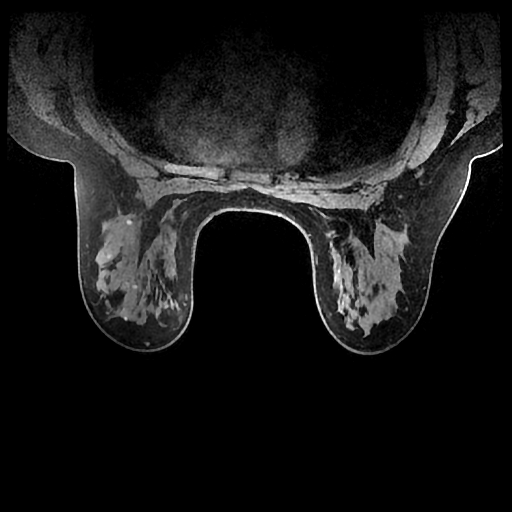
[im 186/248]
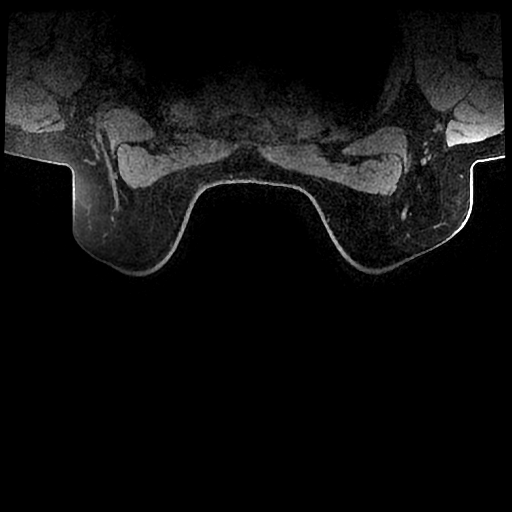
[im 248/248]
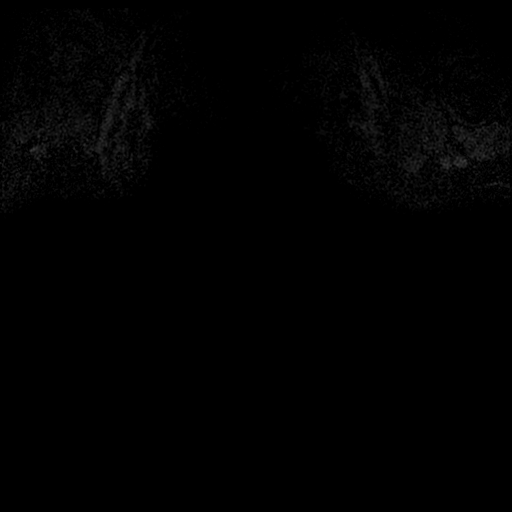

[Series 601: ph1/7 ml's vibrant · axial · 2.0mm · 0.64mm/px · z∈[-154,+93]mm · 5 of 248 slices shown]
[im 1/248]
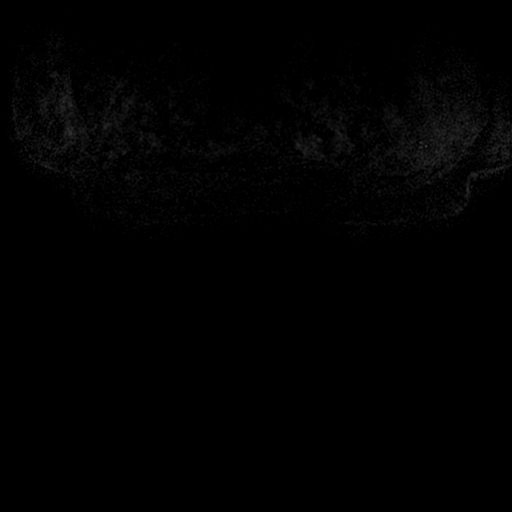
[im 62/248]
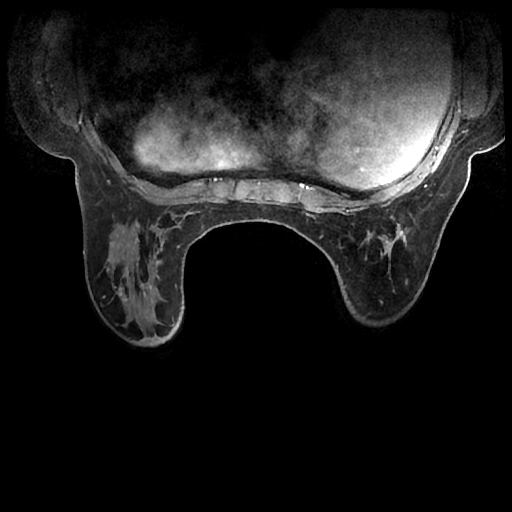
[im 124/248]
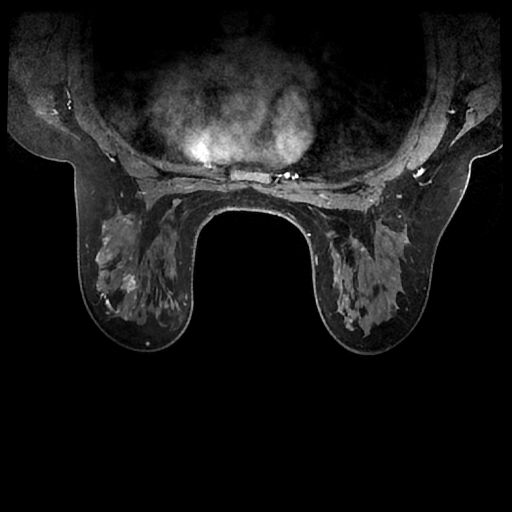
[im 186/248]
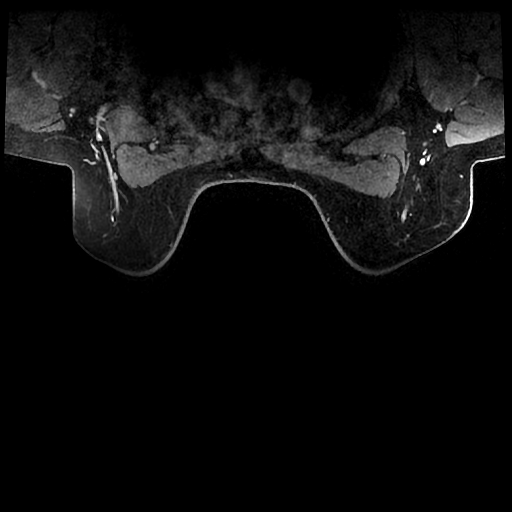
[im 248/248]
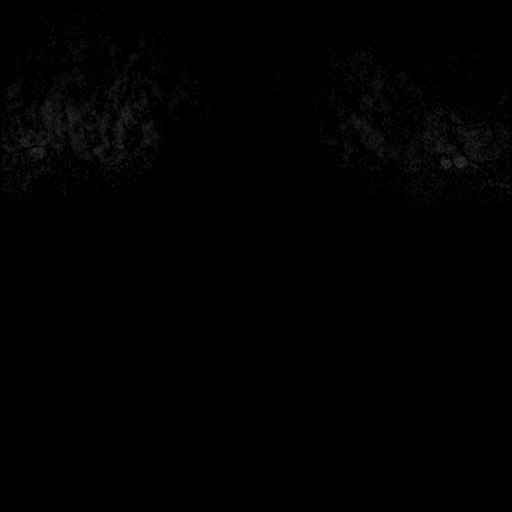

[Series 602: ph2/7 ml's vibrant · axial · 2.0mm · 0.64mm/px · z∈[-154,+93]mm · 5 of 248 slices shown]
[im 1/248]
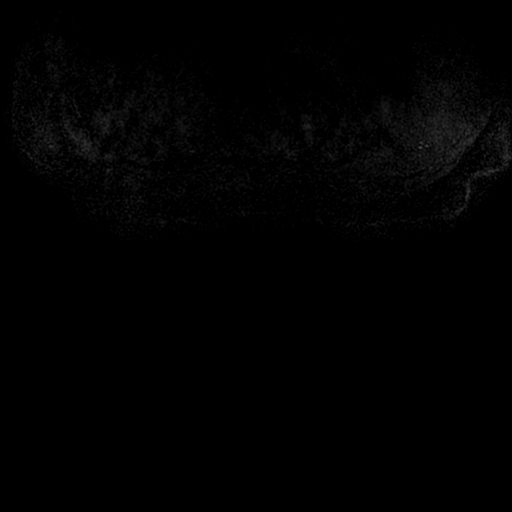
[im 62/248]
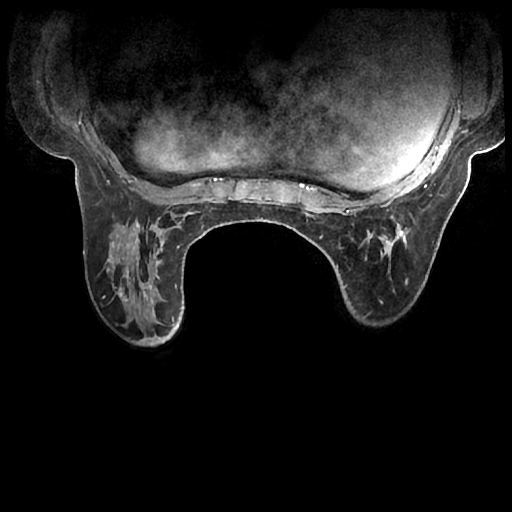
[im 124/248]
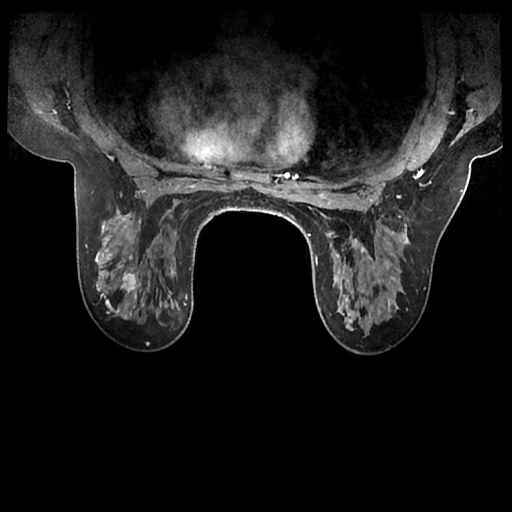
[im 186/248]
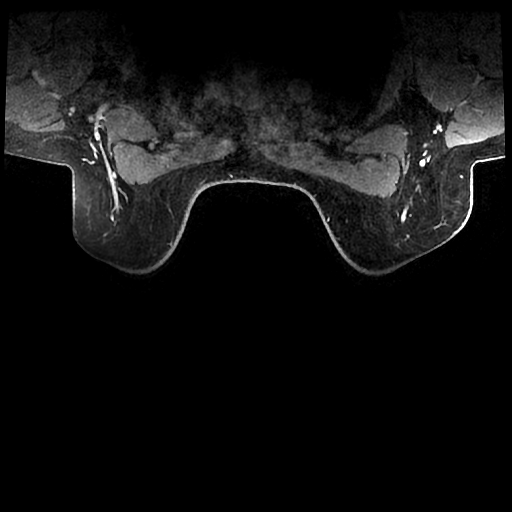
[im 248/248]
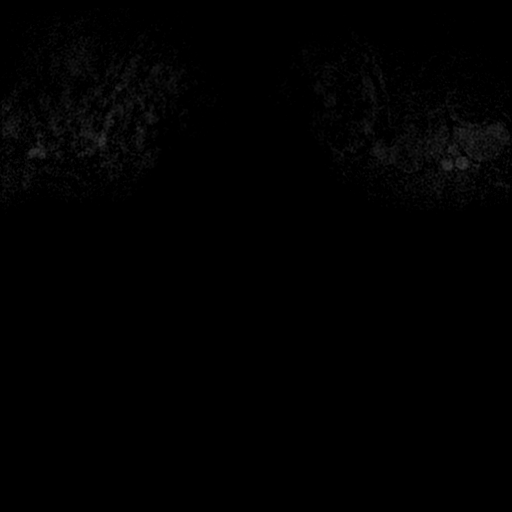

[Series 603: ph3/7 ml's vibrant · axial · 2.0mm · 0.64mm/px · z∈[-154,-105]mm · 2 of 248 slices shown]
[im 1/248]
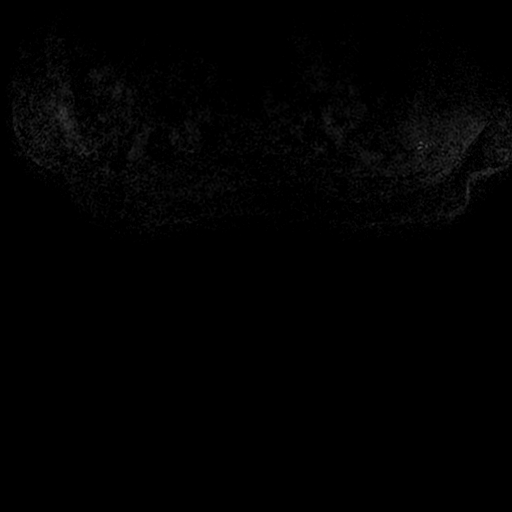
[im 50/248]
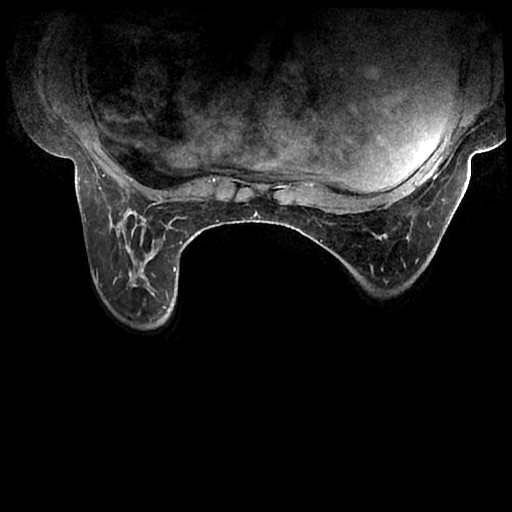

[19 of 48 positions shown; findings below may reference images not displayed]

FINDINGS: Breast composition: d. Extreme fibroglandular tissue.

Background parenchymal enhancement: Mild

Right breast: Scattered foci are noted. There are several benign
cysts within the RIGHT breast. The largest cyst measures 2 cm at the
12 o'clock position with mild wall enhancement compatible with an
inflamed cyst..

No suspicious mass or worrisome enhancement identified.

Left breast: Scattered foci are noted. Scattered benign cysts are
identified with the largest cysts measuring 2.5 cm in the posterior
OUTER LEFT breast with mild wall enhancement compatible with an
inflamed cyst. Fibrocystic changes scattered within the LEFT breast
are identified.

No suspicious mass or worrisome enhancement noted.

Lymph nodes: No abnormal appearing lymph nodes.

Ancillary findings:  None.
IMPRESSION: 1. No convincing MR evidence of breast malignancy. No evidence of
RIGHT breast malignancy given this patient's RIGHT nipple discharge
and atypia. Clinical/surgical follow-up recommended.
2. Bilateral benign cystic changes within both breasts. Some cysts
bilaterally are inflamed.

RECOMMENDATION:
1. Clinical/surgical follow-up given patient's RIGHT nipple
discharge and atypia.
2. Bilateral screening mammogram in 1 year

BI-RADS CATEGORY  2: Benign.

## 2020-10-09 ENCOUNTER — Other Ambulatory Visit: Payer: Self-pay

## 2020-10-09 ENCOUNTER — Telehealth (INDEPENDENT_AMBULATORY_CARE_PROVIDER_SITE_OTHER): Payer: No Payment, Other | Admitting: Psychiatry

## 2020-10-09 ENCOUNTER — Encounter (HOSPITAL_COMMUNITY): Payer: Self-pay | Admitting: Psychiatry

## 2020-10-09 DIAGNOSIS — F3130 Bipolar disorder, current episode depressed, mild or moderate severity, unspecified: Secondary | ICD-10-CM

## 2020-10-09 MED ORDER — TRAZODONE HCL 50 MG PO TABS: 50.0000 mg | ORAL_TABLET | Freq: Every evening | ORAL | 3 refills | Status: DC | PRN

## 2020-10-09 MED ORDER — BENZTROPINE MESYLATE 0.5 MG PO TABS: 0.5000 mg | ORAL_TABLET | Freq: Every day | ORAL | 3 refills | Status: DC

## 2020-10-09 MED ORDER — HALOPERIDOL 5 MG PO TABS: 5.0000 mg | ORAL_TABLET | Freq: Every day | ORAL | 3 refills | Status: DC

## 2020-10-09 MED ORDER — FLUOXETINE HCL 40 MG PO CAPS: 40.0000 mg | ORAL_CAPSULE | Freq: Every day | ORAL | 3 refills | Status: DC

## 2020-10-09 MED ORDER — LITHIUM CARBONATE 300 MG PO CAPS
600.0000 mg | ORAL_CAPSULE | Freq: Every day | ORAL | 3 refills | Status: DC
Start: 2020-10-09 — End: 2021-01-15

## 2020-10-09 NOTE — Progress Notes (Signed)
[facility] MD/PA/NP OP Progress Note Virtual Visit via Video Note  I connected with Vanessa Harrison on 10/09/20 at  2:00 PM EDT by a video enabled telemedicine application and verified that I am speaking with the correct person using two identifiers.  Location: Patient: Home Provider: Clinic   I discussed the limitations of evaluation and management by telemedicine and the availability of in person appointments. The patient expressed understanding and agreed to proceed.  I provided 30 minutes of non-face-to-face time during this encounter.    10/09/2020 2:21 PM Vanessa Harrison  MRN:  [id number]  Chief Complaint: "11 years ago I got sick and now I feel like I have that illness on me all the time"  HPI: 45 year old female seen today for follow up psychiatric evaluation. Provider utilized Spanish speaking interpreter as patient speaks Spanish.  She has a psychiatric history of anxiety and bipolar disorder.  She is currently managed on trazodone 25 mg to 50 mg nightly as needed, lithium 600 mg nightly, Haldol 5 mg nightly, Prozac 40 mg daily, and hydroxyzine 0.5 mg daily.  Today she reported that her medications are effective  in managing her psychiatric conditions.   Today she is well groomed, pleasant, cooperative, engaged in conversation, and maintained eye contact. She informed Probation officer that at times 11 years ago she had a mental health break and feels that she deals with her psychiatric illness all the time. 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 She notes that she lacks motivation to cook and reports that she and her husband argues about this at times. She reports that most days she is tired and forgetful and notes that she has been taking folic acid that has been somewhat effective for her memory. She ask provider if it is okay to take folic acid and other multivitamins. Provider informed  patient that its okay to take multivitamins. She informed Probation officer that she has been unable to follow up with neurology due to transportation as she no longer drives.   Patient notes that her anxiety and depression seems to be well managed. Today provider conducted a GAD-7 and patient scored a 9 , at her last visit she scored a 2. She notes that she worries about her immigration status because she was supposed to turn her green card in and did not properly do so. She notes that she took it to a store that was supposed to do it for her and they did not.  Provider also conducted a PHQ-9 and patient scored a 7, at her last visit she scored a 16.  She notes that she is saddened because for the last 18 years she has not been able to travel back to [place]. Patient endorses passive SI but denies wanting to harm herself today.Today she denied SI/HI/VAH, paranoia, or mania.  She endorses adequate sleep and appetite.    No medication changes made today. Patient agreeable to continue medications as prescribed. No other concerns noted at this time.    Visit Diagnosis:    ICD-10-CM   1. Bipolar I disorder, most recent episode depressed (HCC)  F31.30 benztropine (COGENTIN) 0.5 MG tablet    FLUoxetine (PROZAC) 40 MG capsule    haloperidol (HALDOL) 5 MG tablet    lithium carbonate 300 MG capsule    traZODone (DESYREL) 50 MG tablet      Past Psychiatric History: Anxiety and bipolar disorder  Past Medical History:  Past Medical History:  Diagnosis Date   Anxiety    Bipolar 2 disorder ([place])    Discharge from right nipple 11/19/2017   Hypercholesteremia     Past Surgical History:  Procedure Laterality Date   APPENDECTOMY  09/2012   lap appy   BREAST LUMPECTOMY Right 11/19/2017   Procedure: RIGHT BREAST LUMPECTOMY SUBAREOLAR;  Surgeon: Fanny Skates, MD;  Location: [facility] OR;  Service: General;  Laterality: Right;   CESAREAN SECTION     LAPAROSCOPIC APPENDECTOMY N/A 09/24/2012   Procedure: APPENDECTOMY  LAPAROSCOPIC;  Surgeon: Gwenyth Ober, MD;  Location: [place];  Service: General;  Laterality: N/A;    Family Psychiatric History: Denies  Family History:  Family History  Problem Relation Age of Onset   Diabetes Mother    Hypertension Mother    Diabetes Sister    Hypertension Sister    Cancer Sister 55       breast   Hypertension Brother     Social History:  Social History   Socioeconomic History   Marital status: Married    Spouse name: Not on file   Number of children: Not on file   Years of education: Not on file   Highest education level: Not on file  Occupational History   Not on file  Tobacco Use   Smoking status: Never   Smokeless tobacco: Never  Vaping Use   Vaping Use: Never used  Substance and Sexual Activity   Alcohol use: No   Drug use: No   Sexual activity: Yes    Birth control/protection: Coitus interruptus  Other Topics Concern   Not on file  Social History Narrative   ** Merged History Encounter **       Social Determinants of Health   Financial Resource Strain: Not on file  Food Insecurity: Not on file  Transportation Needs: Not on file  Physical Activity: Not on file  Stress: Not on file  Social Connections: Not on file    Allergies: No Known Allergies  Metabolic Disorder Labs: No results found for: HGBA1C, MPG No results found for: PROLACTIN Lab Results  Component Value Date   CHOL 232 (H) 03/26/2010   TRIG 224 (H) 03/26/2010   HDL 31 (L) 03/26/2010   CHOLHDL 7.5 Ratio 03/26/2010   VLDL 45 (H) 03/26/2010   LDLCALC 156 (H) 03/26/2010   LDLCALC 231 (H) 11/22/2009   Lab Results  Component Value Date   TSH 4.344 02/23/2008   TSH 3.595 08/02/2007    Therapeutic Level Labs: Lab Results  Component Value Date   LITHIUM 0.62 (L) 09/24/2012   LITHIUM 0.54 (L) 11/22/2009   No results found for: VALPROATE No components found for:  CBMZ  Current Medications: Current Outpatient Medications  Medication Sig Dispense Refill    atorvastatin (LIPITOR) 80 MG tablet Take 80 mg by mouth daily.     benztropine (COGENTIN) 0.5 MG tablet Take 1 tablet (0.5 mg total) by mouth daily. 30 tablet 3   FLUoxetine (PROZAC) 40 MG capsule Take 1 capsule (40 mg total) by mouth daily. 30 capsule 3   haloperidol (HALDOL) 5 MG tablet Take 1 tablet (5 mg total) by mouth at bedtime. 30 tablet 3   HYDROcodone-acetaminophen (NORCO) 5-325 MG tablet Take 1-2 tablets by mouth every 6 (six) hours as needed for moderate pain or severe pain. 20 tablet 0   lithium carbonate 300 MG capsule Take 2 capsules (600 mg total) by mouth at bedtime. 60 capsule 3  traZODone (DESYREL) 50 MG tablet Take 1 tablet (50 mg total) by mouth at bedtime as needed for sleep. 30 tablet 3   No current facility-administered medications for this visit.     Musculoskeletal: Strength & Muscle Tone:  Unable to assess due to telehealth visit Bone Gap:  Unable to assess due to telehealth visit Patient leans: N/A  Psychiatric Specialty Exam: Review of Systems  There were no vitals taken for this visit.There is no height or weight on file to calculate BMI.  General Appearance: Well Groomed  Eye Contact:  Good  Speech:  Clear and Coherent and Normal Rate  Volume:  Normal  Mood:  Euthymic and reports occassional depression however notes she is able to cope with it  Affect:  Appropriate and Congruent  Thought Process:  Coherent, Goal Directed and Linear  Orientation:  Full (Time, Place, and Person)  Thought Content: WDL and Logical   Suicidal Thoughts:  No  Homicidal Thoughts:  No  Memory:  Immediate;   Good Recent;   Good Remote;   Good  Judgement:  Good  Insight:  Good  Psychomotor Activity:  Normal  Concentration:  Concentration: Good and Attention Span: Good  Recall:  Good  Fund of Knowledge: Good  Language: Good  Akathisia:  No  Handed:  Right  AIMS (if indicated): Not done  Assets:  Communication Skills Desire for Improvement Financial  Resources/Insurance Housing Intimacy Social Support  ADL's:  Intact  Cognition: WNL  Sleep:  Good   Screenings: GAD-7    Flowsheet Row Video Visit from 10/09/2020 in [facility] Video Visit from 07/10/2020 in [facility] Video Visit from 04/16/2020 in [facility] Video Visit from 01/17/2020 in [facility]  Total GAD-7 Score '7 2 6 11      '$ PHQ2-9    Flowsheet Row Video Visit from 10/09/2020 in [facility] Video Visit from 07/10/2020 in [facility] Video Visit from 04/16/2020 in [facility] Video Visit from 01/17/2020 in [facility]  PHQ-2 Total Score 1 2 0 2  PHQ-9 Total Score '9 16 10 6      '$ Flowsheet Row Video Visit from 10/09/2020 in [facility] Video Visit from 07/10/2020 in [facility] Video Visit from 04/16/2020 in [facility] Error: Q7 should not be populated when Q6 is No Error: Q7 should not be populated when Q6 is No Error: Q3, 4, or 5 should not be populated when Q2 is No        Assessment and Plan: Patient notes that her anxiety and depression are well managed. She reports recently she found out that she did not properly turn her immigration card in properly and reports this is stressing her out. She notes that she has note returned to [place] in 18 years which she reports makes her sad. Patient notes that she is able to cope with these stressors. No medication changes made today. Patient agreeable to continue medications as prescribed.   1. Bipolar I disorder, most recent episode depressed ([place])  Continue- benztropine (COGENTIN) 0.5 MG tablet; Take 1 tablet (0.5 mg total) by mouth daily.  Dispense: 30 tablet; Refill: 3 Continue- FLUoxetine (PROZAC) 40 MG  capsule; Take 1 capsule (40 mg total) by mouth daily.  Dispense: 30 capsule; Refill: 3 Continue- haloperidol (HALDOL) 5 MG  tablet; Take 1 tablet (5 mg total) by mouth at bedtime.  Dispense: 30 tablet; Refill: 3 Continue- lithium carbonate 300 MG capsule; Take 2 capsules (600 mg total) by mouth at bedtime.  Dispense: 60 capsule; Refill: 3 Continue- traZODone (DESYREL) 50 MG tablet; Take 1 tablet (50 mg total) by mouth at bedtime as needed for sleep.  Dispense: 30 tablet; Refill: 3      Follow up in 3 months Salley Slaughter, NP 10/09/2020, 2:21 PM

## 2021-01-15 ENCOUNTER — Encounter (HOSPITAL_COMMUNITY): Payer: Self-pay | Admitting: Psychiatry

## 2021-01-15 ENCOUNTER — Telehealth (INDEPENDENT_AMBULATORY_CARE_PROVIDER_SITE_OTHER): Payer: No Payment, Other | Admitting: Psychiatry

## 2021-01-15 DIAGNOSIS — F3130 Bipolar disorder, current episode depressed, mild or moderate severity, unspecified: Secondary | ICD-10-CM

## 2021-01-15 MED ORDER — LITHIUM CARBONATE 300 MG PO CAPS: 600.0000 mg | ORAL_CAPSULE | Freq: Every day | ORAL | 3 refills | Status: DC

## 2021-01-15 MED ORDER — HALOPERIDOL 5 MG PO TABS
5.0000 mg | ORAL_TABLET | Freq: Every day | ORAL | 3 refills | Status: DC
Start: 2021-01-15 — End: 2021-03-19

## 2021-01-15 MED ORDER — BENZTROPINE MESYLATE 0.5 MG PO TABS: 0.5000 mg | ORAL_TABLET | Freq: Every day | ORAL | 3 refills | Status: DC

## 2021-01-15 MED ORDER — TRAZODONE HCL 50 MG PO TABS: 50.0000 mg | ORAL_TABLET | Freq: Every evening | ORAL | 3 refills | Status: DC | PRN

## 2021-01-15 MED ORDER — FLUOXETINE HCL 40 MG PO CAPS: 40.0000 mg | ORAL_CAPSULE | Freq: Every day | ORAL | 3 refills | Status: DC

## 2021-01-15 NOTE — Progress Notes (Signed)
[facility] MD/PA/NP OP Progress Note Virtual Visit via Video Note  I connected with Vanessa Harrison on 01/15/21 at  2:30 PM EST by a video enabled telemedicine application and verified that I am speaking with the correct person using two identifiers.  Location: Patient: Home Provider: Clinic   I discussed the limitations of evaluation and management by telemedicine and the availability of in person appointments. The patient expressed understanding and agreed to proceed.  I provided 30 minutes of non-face-to-face time during this encounter.    01/15/2021 2:30 PM Vanessa Harrison  MRN:  [id number]  Chief Complaint: "I have been going well. I'm thinking about getting a service animal "  HPI: 45 year old female seen today for follow up psychiatric evaluation. Provider utilized Spanish speaking interpreter as patient speaks Spanish.  She has a psychiatric history of anxiety and bipolar disorder.  She is currently managed on trazodone 25 mg to 50 mg nightly as needed, lithium 600 mg nightly, Haldol 5 mg nightly, Prozac 40 mg daily, and hydroxyzine 0.5 mg daily.  Today she reported that her medications are effective  in managing her psychiatric conditions.   Today she is well groomed, pleasant, cooperative, engaged in conversation, and maintained eye contact. She informed Probation officer that she notes that things are going well. She however notes that she wants to wants a service animal to help manage he mental health conditions. Patient notes that at times she becomes anxious but is able to cope with it. She informed Probation officer that her mood is stable and denies SI/HI/VAH, mania or paranoia.  Patient informed Probation officer that she has been taking Nuervia  vitamin to help manage her memory. She notes that it is somewhat effective. She informed Probation officer that she continue to work at a [employer], [employer] and finds enjoyment in her job.   No medication changes made today. Patient agreeable to continue medications  as prescribed. Patient informed Probation officer that she has not had he Lithium levels drawn in awhile. Provider ordered Lithium levels, CBC, and LFT. No other concerns noted at this time.    Visit Diagnosis:  No diagnosis found.   Past Psychiatric History: Anxiety and bipolar disorder  Past Medical History:  Past Medical History:  Diagnosis Date   Anxiety    Bipolar 2 disorder ([place])    Discharge from right nipple 11/19/2017   Hypercholesteremia     Past Surgical History:  Procedure Laterality Date   APPENDECTOMY  09/2012   lap appy   BREAST LUMPECTOMY Right 11/19/2017   Procedure: RIGHT BREAST LUMPECTOMY SUBAREOLAR;  Surgeon: Fanny Skates, MD;  Location: [place];  Service: General;  Laterality: Right;   CESAREAN SECTION     LAPAROSCOPIC APPENDECTOMY N/A 09/24/2012   Procedure: APPENDECTOMY LAPAROSCOPIC;  Surgeon: Gwenyth Ober, MD;  Location: [place];  Service: General;  Laterality: N/A;    Family Psychiatric History: Denies  Family History:  Family History  Problem Relation Age of Onset   Diabetes Mother    Hypertension Mother    Diabetes Sister    Hypertension Sister    Cancer Sister 36       breast   Hypertension Brother     Social History:  Social History   Socioeconomic History   Marital status: Married    Spouse name: Not on file   Number of children: Not on file   Years of education: Not on file   Highest education level: Not on file  Occupational History   Not on file  Tobacco Use  Smoking status: Never   Smokeless tobacco: Never  Vaping Use   Vaping Use: Never used  Substance and Sexual Activity   Alcohol use: No   Drug use: No   Sexual activity: Yes    Birth control/protection: Coitus interruptus  Other Topics Concern   Not on file  Social History Narrative   ** Merged History Encounter **       Social Determinants of Health   Financial Resource Strain: Not on file  Food Insecurity: Not on file  Transportation Needs: Not on file  Physical  Activity: Not on file  Stress: Not on file  Social Connections: Not on file    Allergies: No Known Allergies  Metabolic Disorder Labs: No results found for: HGBA1C, MPG No results found for: PROLACTIN Lab Results  Component Value Date   CHOL 232 (H) 03/26/2010   TRIG 224 (H) 03/26/2010   HDL 31 (L) 03/26/2010   CHOLHDL 7.5 Ratio 03/26/2010   VLDL 45 (H) 03/26/2010   LDLCALC 156 (H) 03/26/2010   LDLCALC 231 (H) 11/22/2009   Lab Results  Component Value Date   TSH 4.344 02/23/2008   TSH 3.595 08/02/2007    Therapeutic Level Labs: Lab Results  Component Value Date   LITHIUM 0.62 (L) 09/24/2012   LITHIUM 0.54 (L) 11/22/2009   No results found for: VALPROATE No components found for:  CBMZ  Current Medications: Current Outpatient Medications  Medication Sig Dispense Refill   atorvastatin (LIPITOR) 80 MG tablet Take 80 mg by mouth daily.     benztropine (COGENTIN) 0.5 MG tablet Take 1 tablet (0.5 mg total) by mouth daily. 30 tablet 3   FLUoxetine (PROZAC) 40 MG capsule Take 1 capsule (40 mg total) by mouth daily. 30 capsule 3   haloperidol (HALDOL) 5 MG tablet Take 1 tablet (5 mg total) by mouth at bedtime. 30 tablet 3   HYDROcodone-acetaminophen (NORCO) 5-325 MG tablet Take 1-2 tablets by mouth every 6 (six) hours as needed for moderate pain or severe pain. 20 tablet 0   lithium carbonate 300 MG capsule Take 2 capsules (600 mg total) by mouth at bedtime. 60 capsule 3   traZODone (DESYREL) 50 MG tablet Take 1 tablet (50 mg total) by mouth at bedtime as needed for sleep. 30 tablet 3   No current facility-administered medications for this visit.     Musculoskeletal: Strength & Muscle Tone:  Unable to assess due to telehealth visit [place]:  Unable to assess due to telehealth visit Patient leans: N/A  Psychiatric Specialty Exam: Review of Systems  There were no vitals taken for this visit.There is no height or weight on file to calculate BMI.  General  Appearance: Well Groomed  Eye Contact:  Good  Speech:  Clear and Coherent and Normal Rate  Volume:  Normal  Mood:  Euthymic and reports occassional depression however notes she is able to cope with it  Affect:  Appropriate and Congruent  Thought Process:  Coherent, Goal Directed and Linear  Orientation:  Full (Time, Place, and Person)  Thought Content: WDL and Logical   Suicidal Thoughts:  No  Homicidal Thoughts:  No  Memory:  Immediate;   Good Recent;   Good Remote;   Good  Judgement:  Good  Insight:  Good  Psychomotor Activity:  Normal  Concentration:  Concentration: Good and Attention Span: Good  Recall:  Good  Fund of Knowledge: Good  Language: Good  Akathisia:  No  Handed:  Right  AIMS (if indicated):  Not done  Assets:  Communication Skills Desire for Improvement Financial Resources/Insurance Housing Intimacy Social Support  ADL's:  Intact  Cognition: WNL  Sleep:  Good   Screenings: GAD-7    Flowsheet Row Video Visit from 10/09/2020 in [facility] Video Visit from 07/10/2020 in [facility] Video Visit from 04/16/2020 in [facility] Video Visit from 01/17/2020 in [facility]  Total GAD-7 Score 7 2 6 11       PHQ2-9    Flowsheet Row Video Visit from 10/09/2020 in [facility] Video Visit from 07/10/2020 in [facility] Video Visit from 04/16/2020 in [facility] Video Visit from 01/17/2020 in [place]  PHQ-2 Total Score 1 2 0 2  PHQ-9 Total Score 9 16 10 6       Flowsheet Row Video Visit from 10/09/2020 in [facility] Video Visit from 07/10/2020 in [facility] Video Visit from 04/16/2020 in [place] Error: Q7 should not be populated when Q6 is  No Error: Q7 should not be populated when Q6 is No Error: Q3, 4, or 5 should not be populated when Q2 is No        Assessment and Plan: Patient notes that her anxiety and depression are well managed. No medication changes made today. Patient agreeable to continue medications as prescribed. Patient informed Probation officer that she has not had he Lithium levels drawn in awhile. Provider ordered Lithium levels, CBC, and LFT.   1. Bipolar I disorder, most recent episode depressed ([place])  Continue- benztropine (COGENTIN) 0.5 MG tablet; Take 1 tablet (0.5 mg total) by mouth daily.  Dispense: 30 tablet; Refill: 3 Continue- FLUoxetine (PROZAC) 40 MG capsule; Take 1 capsule (40 mg total) by mouth daily.  Dispense: 30 capsule; Refill: 3 Continue- haloperidol (HALDOL) 5 MG tablet; Take 1 tablet (5 mg total) by mouth at bedtime.  Dispense: 30 tablet; Refill: 3 Continue- lithium carbonate 300 MG capsule; Take 2 capsules (600 mg total) by mouth at bedtime.  Dispense: 60 capsule; Refill: 3 Continue- traZODone (DESYREL) 50 MG tablet; Take 1 tablet (50 mg total) by mouth at bedtime as needed for sleep.  Dispense: 30 tablet; Refill: 3 - Lithium level - CBC w/Diff/Platelet - Hepatic function panel       Follow up in 3 months Salley Slaughter, NP 01/15/2021, 2:30 PM

## 2021-03-19 ENCOUNTER — Encounter (HOSPITAL_COMMUNITY): Payer: Self-pay | Admitting: Psychiatry

## 2021-03-19 ENCOUNTER — Telehealth (INDEPENDENT_AMBULATORY_CARE_PROVIDER_SITE_OTHER): Payer: No Payment, Other | Admitting: Psychiatry

## 2021-03-19 DIAGNOSIS — F3130 Bipolar disorder, current episode depressed, mild or moderate severity, unspecified: Secondary | ICD-10-CM

## 2021-03-19 MED ORDER — HALOPERIDOL 5 MG PO TABS: 5.0000 mg | ORAL_TABLET | Freq: Every day | ORAL | 3 refills | Status: DC

## 2021-03-19 MED ORDER — FLUOXETINE HCL 40 MG PO CAPS: 40.0000 mg | ORAL_CAPSULE | Freq: Every day | ORAL | 3 refills | Status: DC

## 2021-03-19 MED ORDER — TRAZODONE HCL 50 MG PO TABS: 50.0000 mg | ORAL_TABLET | Freq: Every evening | ORAL | 3 refills | Status: DC | PRN

## 2021-03-19 MED ORDER — LITHIUM CARBONATE 300 MG PO CAPS: 600.0000 mg | ORAL_CAPSULE | Freq: Every day | ORAL | 3 refills | Status: DC

## 2021-03-19 MED ORDER — BENZTROPINE MESYLATE 0.5 MG PO TABS: 0.5000 mg | ORAL_TABLET | Freq: Every day | ORAL | 3 refills | Status: DC

## 2021-03-19 NOTE — Progress Notes (Signed)
BH MD/PA/NP OP Progress Note Virtual Visit via Video Note  I connected with Vanessa Harrison on 03/19/21 at  3:30 PM EST by a video enabled telemedicine application and verified that I am speaking with the correct person using two identifiers.  Location: Patient: Home Provider: Clinic   I discussed the limitations of evaluation and management by telemedicine and the availability of in person appointments. The patient expressed understanding and agreed to proceed.  I provided 30 minutes of non-face-to-face time during this encounter.    03/19/2021 4:16 PM Vanessa Harrison  MRN:  [id number]  Chief Complaint: "Im okay but my memory is still off "  HPI: 46 year old female seen today for follow up psychiatric evaluation. Provider utilized Spanish speaking interpreter as patient speaks Spanish.  She has a psychiatric history of anxiety and bipolar disorder.  She is currently managed on trazodone 25 mg to 50 mg nightly as needed, lithium 600 mg nightly, Haldol 5 mg nightly, Prozac 40 mg daily, and Cogentin 0.5 mg daily.  Today she reported that her medications are effective  in managing her psychiatric conditions.   Today she is well groomed, pleasant, cooperative, engaged in conversation, and maintained eye contact.  She informed Probation officer that mentally feels okay but notes that her memory continues to be off. Provider asked patient about having her lab (Lithium, CBC, and LFT) done but she notes that she is waiting for her primary care provider visit as they generally do a whole laboratory work up. Patient notes that her mood, anxiety, and depression are well managed. Today provider GAD 7 and patient scored a 4. Provider also conducted a PHQ 9 and patient scored a 6. She endorses having increased sleep noting she sleeps 10 hours nightly. She endorses having a good appetite. Today she denies SI/HI/VAH, mania or paranoia.  Patient reports that at times she has weird movements in her  legs.  Provider conducted an aims assessment and patient scored a 1.  She notes that some of her movements are due to physical strain from her past job.  She notes that she hurt her shoulder lifting and notes that at times her knees hurt because of bending.  She notes that she has been utilizing a stationary bike and reports that she is able to control movements in legs and her arms.  Patient informed Probation officer that she has been taking Nuervia  vitamin to help manage her memory. She notes that it is somewhat effective. She informed Probation officer that she was laid off from her the [employer] and is looking for a new position.  She informed Probation officer that this is hard as she is undocumented.    No medication changes made today. Patient agreeable to continue medications as prescribed. Patient informed Probation officer that she will have next week  lithium levels, CBC, and LFT  drawn at her next primary care appointment. No other concerns noted at this time.    Visit Diagnosis:    ICD-10-CM   1. Bipolar I disorder, most recent episode depressed (HCC)  F31.30 benztropine (COGENTIN) 0.5 MG tablet    FLUoxetine (PROZAC) 40 MG capsule    haloperidol (HALDOL) 5 MG tablet    lithium carbonate 300 MG capsule    traZODone (DESYREL) 50 MG tablet      Past Psychiatric History: Anxiety and bipolar disorder  Past Medical History:  Past Medical History:  Diagnosis Date   Anxiety    Bipolar 2 disorder ([place])    Discharge from right nipple 11/19/2017  Hypercholesteremia     Past Surgical History:  Procedure Laterality Date   APPENDECTOMY  09/2012   lap appy   BREAST LUMPECTOMY Right 11/19/2017   Procedure: RIGHT BREAST LUMPECTOMY SUBAREOLAR;  Surgeon: Fanny Skates, MD;  Location: [place];  Service: General;  Laterality: Right;   CESAREAN SECTION     LAPAROSCOPIC APPENDECTOMY N/A 09/24/2012   Procedure: APPENDECTOMY LAPAROSCOPIC;  Surgeon: Gwenyth Ober, MD;  Location: [place];  Service: General;  Laterality: N/A;     Family Psychiatric History: Denies  Family History:  Family History  Problem Relation Age of Onset   Diabetes Mother    Hypertension Mother    Diabetes Sister    Hypertension Sister    Cancer Sister 48       breast   Hypertension Brother     Social History:  Social History   Socioeconomic History   Marital status: Married    Spouse name: Not on file   Number of children: Not on file   Years of education: Not on file   Highest education level: Not on file  Occupational History   Not on file  Tobacco Use   Smoking status: Never   Smokeless tobacco: Never  Vaping Use   Vaping Use: Never used  Substance and Sexual Activity   Alcohol use: No   Drug use: No   Sexual activity: Yes    Birth control/protection: Coitus interruptus  Other Topics Concern   Not on file  Social History Narrative   ** Merged History Encounter **       Social Determinants of Health   Financial Resource Strain: Not on file  Food Insecurity: Not on file  Transportation Needs: Not on file  Physical Activity: Not on file  Stress: Not on file  Social Connections: Not on file    Allergies: No Known Allergies  Metabolic Disorder Labs: No results found for: HGBA1C, MPG No results found for: PROLACTIN Lab Results  Component Value Date   CHOL 232 (H) 03/26/2010   TRIG 224 (H) 03/26/2010   HDL 31 (L) 03/26/2010   CHOLHDL 7.5 Ratio 03/26/2010   VLDL 45 (H) 03/26/2010   LDLCALC 156 (H) 03/26/2010   LDLCALC 231 (H) 11/22/2009   Lab Results  Component Value Date   TSH 4.344 02/23/2008   TSH 3.595 08/02/2007    Therapeutic Level Labs: Lab Results  Component Value Date   LITHIUM 0.62 (L) 09/24/2012   LITHIUM 0.54 (L) 11/22/2009   No results found for: VALPROATE No components found for:  CBMZ  Current Medications: Current Outpatient Medications  Medication Sig Dispense Refill   atorvastatin (LIPITOR) 80 MG tablet Take 80 mg by mouth daily.     benztropine (COGENTIN) 0.5 MG  tablet Take 1 tablet (0.5 mg total) by mouth daily. 30 tablet 3   FLUoxetine (PROZAC) 40 MG capsule Take 1 capsule (40 mg total) by mouth daily. 30 capsule 3   haloperidol (HALDOL) 5 MG tablet Take 1 tablet (5 mg total) by mouth at bedtime. 30 tablet 3   HYDROcodone-acetaminophen (NORCO) 5-325 MG tablet Take 1-2 tablets by mouth every 6 (six) hours as needed for moderate pain or severe pain. 20 tablet 0   lithium carbonate 300 MG capsule Take 2 capsules (600 mg total) by mouth at bedtime. 60 capsule 3   traZODone (DESYREL) 50 MG tablet Take 1 tablet (50 mg total) by mouth at bedtime as needed for sleep. 30 tablet 3   No current facility-administered medications for  this visit.     Musculoskeletal: Strength & Muscle Tone:  Unable to assess due to telehealth visit [place]:  Unable to assess due to telehealth visit Patient leans: N/A  Psychiatric Specialty Exam: Review of Systems  There were no vitals taken for this visit.There is no height or weight on file to calculate BMI.  General Appearance: Well Groomed  Eye Contact:  Good  Speech:  Clear and Coherent and Normal Rate  Volume:  Normal  Mood:  Euthymic and reports occassional depression however notes she is able to cope with it  Affect:  Appropriate and Congruent  Thought Process:  Coherent, Goal Directed and Linear  Orientation:  Full (Time, Place, and Person)  Thought Content: WDL and Logical   Suicidal Thoughts:  No  Homicidal Thoughts:  No  Memory:  Immediate;   Good Recent;   Good Remote;   Good  Judgement:  Good  Insight:  Good  Psychomotor Activity:  Normal  Concentration:  Concentration: Good and Attention Span: Good  Recall:  Good  Fund of Knowledge: Good  Language: Good  Akathisia:  No  Handed:  Right  AIMS (if indicated): Not done  Assets:  Communication Skills Desire for Improvement Financial Resources/Insurance Housing Intimacy Social Support  ADL's:  Intact  Cognition: WNL  Sleep:  Good    Screenings: AIMS    Flowsheet Row Video Visit from 03/19/2021 in [place] Total Score 0      GAD-7    Flowsheet Row Video Visit from 03/19/2021 in [facility] Video Visit from 10/09/2020 in [facility] Video Visit from 07/10/2020 in [facility] Video Visit from 04/16/2020 in [facility] Video Visit from 01/17/2020 in [facility]  Total GAD-7 Score 4 7 2 6 11       PHQ2-9    Flowsheet Row Video Visit from 03/19/2021 in [facility] Video Visit from 10/09/2020 in [facility] Video Visit from 07/10/2020 in [facility] Video Visit from 04/16/2020 in [facility] Video Visit from 01/17/2020 in [place]  PHQ-2 Total Score 2 1 2  0 2  PHQ-9 Total Score 6 9 16 10 6       Flowsheet Row Video Visit from 10/09/2020 in [facility] Video Visit from 07/10/2020 in [facility] Video Visit from 04/16/2020 in [place] Error: Q7 should not be populated when Q6 is No Error: Q7 should not be populated when Q6 is No Error: Q3, 4, or 5 should not be populated when Q2 is No        Assessment and Plan: Patient notes that her anxiety and depression are well managed however notes that sge continues to have issues with her memory. No medication changes made today. Patient agreeable to continue medications as prescribed. Patient informed Probation officer that she will have next week  lithium levels, CBC, and LFT  drawn at her next primary care appointment  1. Bipolar I disorder, most recent episode depressed ([place])  Continue- benztropine (COGENTIN) 0.5 MG tablet; Take 1 tablet (0.5 mg total) by mouth daily.   Dispense: 30 tablet; Refill: 3 Continue- FLUoxetine (PROZAC) 40 MG capsule; Take 1 capsule (40 mg total) by mouth daily.  Dispense: 30 capsule; Refill: 3 Continue- haloperidol (HALDOL) 5 MG  tablet; Take 1 tablet (5 mg total) by mouth at bedtime.  Dispense: 30 tablet; Refill: 3 Continue- lithium carbonate 300 MG capsule; Take 2 capsules (600 mg total) by mouth at bedtime.  Dispense: 60 capsule; Refill: 3 Continue- traZODone (DESYREL) 50 MG tablet; Take 1 tablet (50 mg total) by mouth at bedtime as needed for sleep.  Dispense: 30 tablet; Refill: 3        Follow up in 3 months Salley Slaughter, NP 03/19/2021, 4:16 PM

## 2021-06-03 ENCOUNTER — Telehealth (INDEPENDENT_AMBULATORY_CARE_PROVIDER_SITE_OTHER): Payer: No Payment, Other | Admitting: Psychiatry

## 2021-06-03 DIAGNOSIS — F3130 Bipolar disorder, current episode depressed, mild or moderate severity, unspecified: Secondary | ICD-10-CM

## 2021-06-03 MED ORDER — LITHIUM CARBONATE 300 MG PO CAPS: 600.0000 mg | ORAL_CAPSULE | Freq: Every day | ORAL | 0 refills | Status: DC

## 2021-06-03 MED ORDER — FLUOXETINE HCL 40 MG PO CAPS: 40.0000 mg | ORAL_CAPSULE | Freq: Every day | ORAL | 0 refills | Status: DC

## 2021-06-03 MED ORDER — BENZTROPINE MESYLATE 0.5 MG PO TABS: 0.5000 mg | ORAL_TABLET | Freq: Every day | ORAL | 0 refills | Status: DC

## 2021-06-03 MED ORDER — TRAZODONE HCL 50 MG PO TABS: 50.0000 mg | ORAL_TABLET | Freq: Every evening | ORAL | 0 refills | Status: DC | PRN

## 2021-06-03 MED ORDER — HALOPERIDOL 5 MG PO TABS: 5.0000 mg | ORAL_TABLET | Freq: Every day | ORAL | 0 refills | Status: DC

## 2021-06-03 NOTE — Progress Notes (Signed)
BH MD/PA/NP OP Progress Note ? ?06/03/2021 3:30 PM ?Vanessa Harrison  ?MRN:  [id number] ?Virtual Visit via Telephone Note ? ?I connected with Vanessa Harrison on 06/03/21 at 11:30 AM EDT by telephone and verified that I am speaking with the correct person using two identifiers. ? ?Location: ?Patient: home ?Provider: Cleaster Corin ?  ?I discussed the limitations, risks, security and privacy concerns of performing an evaluation and management service by telephone and the availability of in person appointments. I also discussed with the patient that there may be a patient responsible charge related to this service. The patient expressed understanding and agreed to proceed. ? ? ?  ?I discussed the assessment and treatment plan with the patient. The patient was provided an opportunity to ask questions and all were answered. The patient agreed with the plan and demonstrated an understanding of the instructions. ?  ?The patient was advised to call back or seek an in-person evaluation if the symptoms worsen or if the condition fails to improve as anticipated. ? ?I provided 5 minutes of non-face-to-face time during this encounter. ? ? ?Franne Grip, NP  ? ?Chief Complaint: Medication management ? ?HPI: Vanessa Harrison is a 46 year old female presenting to [facility] behavioral health outpatient for follow-up psychiatric evaluation.  She has a psychiatric history of bipolar disorder and her symptoms are managed with Cogentin 0.5 mg daily, Prozac 40 mg daily, Haldol 5 mg daily at bedtime, lithium 600 mg daily at bedtime and trazodone 50 mg at bedtime as needed for sleep.  Patient reports that medications are effective with managing her symptoms and reports medication compliance.  Patient denies adverse medication reactions or the need for dosage adjustment today.  No medication changes today. ? ?Visit Diagnosis:  ?  ICD-10-CM   ?1. Bipolar I disorder, most recent episode depressed (HCC)  F31.30  benztropine (COGENTIN) 0.5 MG tablet  ?  FLUoxetine (PROZAC) 40 MG capsule  ?  haloperidol (HALDOL) 5 MG tablet  ?  lithium carbonate 300 MG capsule  ?  traZODone (DESYREL) 50 MG tablet  ?  ? ? ?Past Psychiatric History:  ? ?Past Medical History:  ?Past Medical History:  ?Diagnosis Date  ? Anxiety   ? Bipolar 2 disorder ([place])   ? Discharge from right nipple 11/19/2017  ? Hypercholesteremia   ?  ?Past Surgical History:  ?Procedure Laterality Date  ? APPENDECTOMY  09/2012  ? lap appy  ? BREAST LUMPECTOMY Right 11/19/2017  ? Procedure: RIGHT BREAST LUMPECTOMY SUBAREOLAR;  Surgeon: Fanny Skates, MD;  Location: [place];  Service: General;  Laterality: Right;  ? CESAREAN SECTION    ? LAPAROSCOPIC APPENDECTOMY N/A 09/24/2012  ? Procedure: APPENDECTOMY LAPAROSCOPIC;  Surgeon: Gwenyth Ober, MD;  Location: [place];  Service: General;  Laterality: N/A;  ? ? ?Family Psychiatric History: None known ? ?Family History:  ?Family History  ?Problem Relation Age of Onset  ? Diabetes Mother   ? Hypertension Mother   ? Diabetes Sister   ? Hypertension Sister   ? Cancer Sister 40  ?     breast  ? Hypertension Brother   ? ? ?Social History:  ?Social History  ? ?Socioeconomic History  ? Marital status: Married  ?  Spouse name: Not on file  ? Number of children: Not on file  ? Years of education: Not on file  ? Highest education level: Not on file  ?Occupational History  ? Not on file  ?Tobacco Use  ? Smoking status: Never  ? Smokeless tobacco: Never  ?  Vaping Use  ? Vaping Use: Never used  ?Substance and Sexual Activity  ? Alcohol use: No  ? Drug use: No  ? Sexual activity: Yes  ?  Birth control/protection: Coitus interruptus  ?Other Topics Concern  ? Not on file  ?Social History Narrative  ? ** Merged History Encounter **  ?    ? ?Social Determinants of Health  ? ?Financial Resource Strain: Not on file  ?Food Insecurity: Not on file  ?Transportation Needs: Not on file  ?Physical Activity: Not on file  ?Stress: Not on file  ?Social  Connections: Not on file  ? ? ?Allergies: No Known Allergies ? ?Metabolic Disorder Labs: ?No results found for: HGBA1C, MPG ?No results found for: PROLACTIN ?Lab Results  ?Component Value Date  ? CHOL 232 (H) 03/26/2010  ? TRIG 224 (H) 03/26/2010  ? HDL 31 (L) 03/26/2010  ? CHOLHDL 7.5 Ratio 03/26/2010  ? VLDL 45 (H) 03/26/2010  ? LDLCALC 156 (H) 03/26/2010  ? LDLCALC 231 (H) 11/22/2009  ? ?Lab Results  ?Component Value Date  ? TSH 4.344 02/23/2008  ? TSH 3.595 08/02/2007  ? ? ?Therapeutic Level Labs: ?Lab Results  ?Component Value Date  ? LITHIUM 0.62 (L) 09/24/2012  ? LITHIUM 0.54 (L) 11/22/2009  ? ?No results found for: VALPROATE ?No components found for:  CBMZ ? ?Current Medications: ?Current Outpatient Medications  ?Medication Sig Dispense Refill  ? atorvastatin (LIPITOR) 80 MG tablet Take 80 mg by mouth daily.    ? benztropine (COGENTIN) 0.5 MG tablet Take 1 tablet (0.5 mg total) by mouth daily. 30 tablet 0  ? FLUoxetine (PROZAC) 40 MG capsule Take 1 capsule (40 mg total) by mouth daily. 30 capsule 0  ? haloperidol (HALDOL) 5 MG tablet Take 1 tablet (5 mg total) by mouth at bedtime. 30 tablet 0  ? HYDROcodone-acetaminophen (NORCO) 5-325 MG tablet Take 1-2 tablets by mouth every 6 (six) hours as needed for moderate pain or severe pain. 20 tablet 0  ? lithium carbonate 300 MG capsule Take 2 capsules (600 mg total) by mouth at bedtime. 60 capsule 0  ? traZODone (DESYREL) 50 MG tablet Take 1 tablet (50 mg total) by mouth at bedtime as needed for sleep. 30 tablet 0  ? ?No current facility-administered medications for this visit.  ? ? ? ?Musculoskeletal: ?Strength & Muscle Tone: N/A virtual visit ?Gait & Station: N/A virtual visit ?Patient leans: N/A ? ?Psychiatric Specialty Exam: ?Review of Systems  ?Psychiatric/Behavioral:  Negative for hallucinations, self-injury and suicidal ideas.   ?All other systems reviewed and are negative.  ?There were no vitals taken for this visit.There is no height or weight on file to  calculate BMI.  ?General Appearance: N/A  ?Eye Contact: N/A  ?Speech: Clear and coherent  ?Volume:  Normal  ?Mood:  Euthymic  ?Affect:  NA  ?Thought Process:  Goal Directed  ?Orientation:  Full (Time, Place, and Person)  ?Thought Content: Good  ?Suicidal Thoughts: No  ?Homicidal Thoughts: No  ?Memory: Good  ?Judgement: Good  ?Insight: Good  ?Psychomotor Activity: N/A  ?Concentration: Good  ?Recall: Good  ?Fund of Knowledge: Good  ?Language: Good  ?Akathisia: N/A  ?Handed:  Right  ?AIMS (if indicated): not done  ?Assets:  Communication Skills ?Desire for Improvement  ?ADL's:  Intact  ?Cognition: WNL  ?Sleep:  Good  ? ?Screenings: ?AIMS   ? ?Flowsheet Row Video Visit from 03/19/2021 in [facility]  ?AIMS Total Score 0  ? ?  ? ?GAD-7   ? ?  Flowsheet Row Video Visit from 03/19/2021 in [facility] Video Visit from 10/09/2020 in [facility] Video Visit from 07/10/2020 in [facility] Video Visit from 04/16/2020 in [facility] Video Visit from 01/17/2020 in [facility]  ?Total GAD-7 Score '4 7 2 6 11  '$ ? ?  ? ?PHQ2-9   ? ?Flowsheet Row Video Visit from 03/19/2021 in [facility] Video Visit from 10/09/2020 in [facility] Video Visit from 07/10/2020 in [facility] Video Visit from 04/16/2020 in [facility] Video Visit from 01/17/2020 in [facility]  ?PHQ-2 Total Score '2 1 2 '$ 0 2  ?PHQ-9 Total Score '6 9 16 10 6  '$ ? ?  ? ?Flowsheet Row Video Visit from 10/09/2020 in [facility] Video Visit from 07/10/2020 in [facility] Video Visit from 04/16/2020 in [facility]  ?C-SSRS RISK CATEGORY Error: Q7 should not be populated when Q6 is No Error: Q7 should not be  populated when Q6 is No Error: Q3, 4, or 5 should not be populated when Q2 is No  ? ?  ? ? ? ?Assessment and Plan: Afsheen Antony is a 46 year old female presenting to [place]

## 2021-08-19 ENCOUNTER — Telehealth (INDEPENDENT_AMBULATORY_CARE_PROVIDER_SITE_OTHER): Payer: No Payment, Other | Admitting: Psychiatry

## 2021-08-19 ENCOUNTER — Encounter (HOSPITAL_COMMUNITY): Payer: Self-pay | Admitting: Psychiatry

## 2021-08-19 DIAGNOSIS — F3130 Bipolar disorder, current episode depressed, mild or moderate severity, unspecified: Secondary | ICD-10-CM

## 2021-08-19 MED ORDER — TRAZODONE HCL 50 MG PO TABS: 50.0000 mg | ORAL_TABLET | Freq: Every evening | ORAL | 3 refills | Status: DC | PRN

## 2021-08-19 MED ORDER — HALOPERIDOL 5 MG PO TABS: 5.0000 mg | ORAL_TABLET | Freq: Every day | ORAL | 3 refills | Status: DC

## 2021-08-19 MED ORDER — FLUOXETINE HCL 40 MG PO CAPS: 40.0000 mg | ORAL_CAPSULE | Freq: Every day | ORAL | 3 refills | Status: DC

## 2021-08-19 MED ORDER — BENZTROPINE MESYLATE 0.5 MG PO TABS: 0.5000 mg | ORAL_TABLET | Freq: Every day | ORAL | 3 refills | Status: DC

## 2021-08-19 MED ORDER — LITHIUM CARBONATE 300 MG PO CAPS: 600.0000 mg | ORAL_CAPSULE | Freq: Every day | ORAL | 3 refills | Status: DC

## 2021-08-19 NOTE — Progress Notes (Signed)
[facility] MD/PA/NP OP Progress Note Virtual Visit via Video Note  I connected with Vanessa Harrison on 08/19/21 at  1:00 PM EDT by a video enabled telemedicine application and verified that I am speaking with the correct person using two identifiers.  Location: Patient: Home Provider: Clinic   I discussed the limitations of evaluation and management by telemedicine and the availability of in person appointments. The patient expressed understanding and agreed to proceed.  I provided 30 minutes of non-face-to-face time during this encounter.    08/19/2021 12:30 PM Vanessa Harrison  MRN:  [id number]  Chief Complaint: "Im very well thank God"  Per husband "When she's having a conversation she is unable to focus or continue the conversation"  HPI: 46 year old female seen today for follow up psychiatric evaluation. Provider utilized Spanish speaking interpreter as patient speaks Spanish.  She has a psychiatric history of anxiety and bipolar disorder.  She is currently managed on trazodone 25 mg to 50 mg nightly as needed, lithium 600 mg nightly, Haldol 5 mg nightly, Prozac 40 mg daily, and Cogentin 0.5 mg daily.  Today she reported that her medications are effective  in managing her psychiatric conditions.   Today she was unable to login virtually so her assessment was done over the phone. During exam she was pleasant, cooperative, and engaged in conversation.  She informed Probation officer that  she is doing very well.  She notes that she has issues concentrating and asked writer to speak with her husband to clarify.  Writer spoke to patient husband's and notes that when she is having a conversation she is unable to focus and continue on with the conversation.  He notes that this has been occurring for the last couple of months.  Patient informed writer that at times she feels disorganized and forgetful.  Provider recommended that patient have labs drawn.  Provider sent lithium, CBC, liver  function test, and thyroid panel to the lab for draw.  Provider also recommended patient being seen by her primary care doctor.  She notes that she has a visit with her primary care doctor in a month.    Patient informed writer that her anxiety and depression are minimal.  Provider conducted a GAD-7 and patient scored a 5.  Provider also conducted PHQ-9 and patient scored a 10.  She endorses adequate sleep and appetite.  Today she denies SI/HI/AVH, mania, paranoia.    No medication changes made today.  Patient agreeable to continue medications as prescribed.  She will have labs drawn we will follow-up with her primary care doctor for further evaluation.  No other concerns at this time.       Visit Diagnosis:    ICD-10-CM   1. Bipolar I disorder, most recent episode depressed ([place])  F31.30 benztropine (COGENTIN) 0.5 MG tablet    FLUoxetine (PROZAC) 40 MG capsule    haloperidol (HALDOL) 5 MG tablet    lithium carbonate 300 MG capsule    traZODone (DESYREL) 50 MG tablet    CBC w/Diff/Platelet    Lithium level    Thyroid Panel With TSH    Hepatic function panel      Past Psychiatric History: Anxiety and bipolar disorder  Past Medical History:  Past Medical History:  Diagnosis Date   Anxiety    Bipolar 2 disorder ([place])    Discharge from right nipple 11/19/2017   Hypercholesteremia     Past Surgical History:  Procedure Laterality Date   APPENDECTOMY  09/2012   lap appy  BREAST LUMPECTOMY Right 11/19/2017   Procedure: RIGHT BREAST LUMPECTOMY SUBAREOLAR;  Surgeon: Fanny Skates, MD;  Location: [place];  Service: General;  Laterality: Right;   CESAREAN SECTION     LAPAROSCOPIC APPENDECTOMY N/A 09/24/2012   Procedure: APPENDECTOMY LAPAROSCOPIC;  Surgeon: Gwenyth Ober, MD;  Location: [place];  Service: General;  Laterality: N/A;    Family Psychiatric History: Denies  Family History:  Family History  Problem Relation Age of Onset   Diabetes Mother    Hypertension Mother     Diabetes Sister    Hypertension Sister    Cancer Sister 75       breast   Hypertension Brother     Social History:  Social History   Socioeconomic History   Marital status: Married    Spouse name: Not on file   Number of children: Not on file   Years of education: Not on file   Highest education level: Not on file  Occupational History   Not on file  Tobacco Use   Smoking status: Never   Smokeless tobacco: Never  Vaping Use   Vaping Use: Never used  Substance and Sexual Activity   Alcohol use: No   Drug use: No   Sexual activity: Yes    Birth control/protection: Coitus interruptus  Other Topics Concern   Not on file  Social History Narrative   ** Merged History Encounter **       Social Determinants of Health   Financial Resource Strain: Not on file  Food Insecurity: Not on file  Transportation Needs: Not on file  Physical Activity: Not on file  Stress: Not on file  Social Connections: Not on file    Allergies: No Known Allergies  Metabolic Disorder Labs: No results found for: "HGBA1C", "MPG" No results found for: "PROLACTIN" Lab Results  Component Value Date   CHOL 232 (H) 03/26/2010   TRIG 224 (H) 03/26/2010   HDL 31 (L) 03/26/2010   CHOLHDL 7.5 Ratio 03/26/2010   VLDL 45 (H) 03/26/2010   LDLCALC 156 (H) 03/26/2010   LDLCALC 231 (H) 11/22/2009   Lab Results  Component Value Date   TSH 4.344 02/23/2008   TSH 3.595 08/02/2007    Therapeutic Level Labs: Lab Results  Component Value Date   LITHIUM 0.62 (L) 09/24/2012   LITHIUM 0.54 (L) 11/22/2009   No results found for: "VALPROATE" No results found for: "CBMZ"  Current Medications: Current Outpatient Medications  Medication Sig Dispense Refill   atorvastatin (LIPITOR) 80 MG tablet Take 80 mg by mouth daily.     benztropine (COGENTIN) 0.5 MG tablet Take 1 tablet (0.5 mg total) by mouth daily. 30 tablet 3   FLUoxetine (PROZAC) 40 MG capsule Take 1 capsule (40 mg total) by mouth daily. 30  capsule 3   haloperidol (HALDOL) 5 MG tablet Take 1 tablet (5 mg total) by mouth at bedtime. 30 tablet 3   HYDROcodone-acetaminophen (NORCO) 5-325 MG tablet Take 1-2 tablets by mouth every 6 (six) hours as needed for moderate pain or severe pain. 20 tablet 0   lithium carbonate 300 MG capsule Take 2 capsules (600 mg total) by mouth at bedtime. 60 capsule 3   traZODone (DESYREL) 50 MG tablet Take 1 tablet (50 mg total) by mouth at bedtime as needed for sleep. 30 tablet 3   No current facility-administered medications for this visit.     Musculoskeletal: Strength & Muscle Tone:  Unable to assess due to telephone visit Gait & Station:  Unable  to assess due to telephone visit Patient leans: N/A  Psychiatric Specialty Exam: Review of Systems  There were no vitals taken for this visit.There is no height or weight on file to calculate BMI.  General Appearance:  Unable to assess due to telephone visit  Eye Contact:  Unable to assess due to telephone visit  Speech:  Clear and Coherent and Normal Rate  Volume:  Normal  Mood:  Euthymic  Affect:  Appropriate and Congruent  Thought Process:  Coherent, Goal Directed and Linear  Orientation:  Full (Time, Place, and Person)  Thought Content: WDL and Logical   Suicidal Thoughts:  No  Homicidal Thoughts:  No  Memory:  Immediate;   Good Recent;   Good Remote;   Good  Judgement:  Good  Insight:  Good  Psychomotor Activity:  Unable to assess due to telephone visit  Concentration:  Concentration: Good and Attention Span: Good  Recall:  Good  Fund of Knowledge: Good  Language: Good  Akathisia:   Patient denies  Handed:  Right  AIMS (if indicated): Not done, patient denies, telephone visit  Assets:  Communication Skills Desire for Improvement Financial Resources/Insurance Housing Intimacy Social Support  ADL's:  Intact  Cognition: WNL  Sleep:  Good   Screenings: AIMS    Flowsheet Row Video Visit from 03/19/2021 in [place] Total Score 0      GAD-7    Flowsheet Row Video Visit from 08/19/2021 in [facility] Video Visit from 03/19/2021 [facility] Video Visit from 10/09/2020 in [facility] Video Visit from 07/10/2020 in [facility] Video Visit from 04/16/2020 in [facility]  Total GAD-7 Score '5 4 7 2 6      '$ PHQ2-9    Flowsheet Row Video Visit from 08/19/2021 in [facility] Video Visit from 03/19/2021 in [facility] Video Visit from 10/09/2020 in [facility] Video Visit from 07/10/2020 in [facility] Video Visit from 04/16/2020 in [place]  PHQ-2 Total Score '2 2 1 2 '$ 0  PHQ-9 Total Score '10 6 9 16 10      '$ Flowsheet Row Video Visit from 10/09/2020 in [facility] Video Visit from 07/10/2020 in [facility] Video Visit from 04/16/2020 in [place] Error: Q7 should not be populated when Q6 is No Error: Q7 should not be populated when Q6 is No Error: Q3, 4, or 5 should not be populated when Q2 is No        Assessment and Plan: Patient notes that her anxiety and depression are well managed however notes that sge continues to have issues with her concentration.  At times she notes that she is unable to speak during conversations as she is not able to focus.  No medication changes made today. Patient agreeable to continue medications as prescribed. Provider recommended having lithium levels, CBC, thyroid panel and LFT  drawn she is agreeable to.  She will also follow-up with her primary care doctor for further assessment.  1. Bipolar I disorder, most recent episode depressed ([place])  Continue- benztropine  (COGENTIN) 0.5 MG tablet; Take 1 tablet (0.5 mg total) by mouth daily.  Dispense: 30 tablet; Refill: 3 Continue- FLUoxetine (PROZAC) 40 MG capsule; Take 1 capsule (40 mg total)  by mouth daily.  Dispense: 30 capsule; Refill: 3 Continue- haloperidol (HALDOL) 5 MG tablet; Take 1 tablet (5 mg total) by mouth at bedtime.  Dispense: 30 tablet; Refill: 3 Continue- lithium carbonate 300 MG capsule; Take 2 capsules (600 mg total) by mouth at bedtime.  Dispense: 60 capsule; Refill: 3 Continue- traZODone (DESYREL) 50 MG tablet; Take 1 tablet (50 mg total) by mouth at bedtime as needed for sleep.  Dispense: 30 tablet; Refill: 3 - CBC w/Diff/Platelet - Lithium level - Thyroid Panel With TSH - Hepatic function panel         Follow up in 3 months Salley Slaughter, NP 08/19/2021, 12:30 PM

## 2021-11-19 ENCOUNTER — Encounter (HOSPITAL_COMMUNITY): Payer: Self-pay | Admitting: Psychiatry

## 2021-11-19 ENCOUNTER — Telehealth (INDEPENDENT_AMBULATORY_CARE_PROVIDER_SITE_OTHER): Payer: No Payment, Other | Admitting: Psychiatry

## 2021-11-19 DIAGNOSIS — F3130 Bipolar disorder, current episode depressed, mild or moderate severity, unspecified: Secondary | ICD-10-CM | POA: Diagnosis not present

## 2021-11-19 MED ORDER — FLUOXETINE HCL 40 MG PO CAPS: 40.0000 mg | ORAL_CAPSULE | Freq: Every day | ORAL | 3 refills | Status: DC

## 2021-11-19 MED ORDER — TRAZODONE HCL 50 MG PO TABS: 50.0000 mg | ORAL_TABLET | Freq: Every evening | ORAL | 3 refills | Status: DC | PRN

## 2021-11-19 MED ORDER — HALOPERIDOL 5 MG PO TABS: 5.0000 mg | ORAL_TABLET | Freq: Every day | ORAL | 3 refills | Status: DC

## 2021-11-19 MED ORDER — BENZTROPINE MESYLATE 0.5 MG PO TABS: 0.5000 mg | ORAL_TABLET | Freq: Every day | ORAL | 3 refills | Status: DC

## 2021-11-19 MED ORDER — LITHIUM CARBONATE 300 MG PO CAPS
600.0000 mg | ORAL_CAPSULE | Freq: Every day | ORAL | 3 refills | Status: DC
Start: 2021-11-19 — End: 2022-03-11

## 2021-11-19 NOTE — Progress Notes (Signed)
[facility] MD/PA/NP OP Progress Note Virtual Visit via Video Note  I connected with Vanessa Harrison on 11/19/21 at  1:00 PM EDT by a video enabled telemedicine application and verified that I am speaking with the correct person using two identifiers.  Location: Patient: Home Provider: Clinic   I discussed the limitations of evaluation and management by telemedicine and the availability of in person appointments. The patient expressed understanding and agreed to proceed.  I provided 30 minutes of non-face-to-face time during this encounter.    11/19/2021 12:59 PM Vanessa Harrison  MRN:  [id number]  Chief Complaint: "I have been doing well"    HPI: 46 year old female seen today for follow up psychiatric evaluation. Provider utilized Spanish speaking interpreter as patient speaks Spanish.  She has a psychiatric history of anxiety and bipolar disorder.  She is currently managed on trazodone 25 mg to 50 mg nightly as needed, lithium 600 mg nightly, Haldol 5 mg nightly, Prozac 40 mg daily, and Cogentin 0.5 mg daily.  She notes that she has not been taking Trazodone in over a year. Today she reported that her medications are effective  in managing her psychiatric conditions.   Today she was unable to login virtually so her assessment was done over the phone. During exam she was pleasant, cooperative, and engaged in conversation.  She informed Probation officer that  she has been doing well. She notes that she had her labs drawn in [place] and reports that her Hemoglobin was 12.2 and her Platelet 321. She notes that her liver and renal function was within normal limits but her cholesterol was elevated.   Patient notes that her lithium level was not drawn. Provider informed patient that she would like this to be done as it could explain her prior symptoms of confusion. Provider asked patient if she was still experiencing confusion and forgetfulness. She notes that this has improved. Overall patient  notes that she finds the medications effective. She reports that she feels that it gives her energy. Today provider conducted a GAD-7 and patient scored a 3, at her last visit she scored a 5.  Provider also conducted PHQ-9 and patient scored a 5, at her last visit she scored a 10.  She endorses adequate sleep and appetite. She reports that she has gained 10 pound since her last visit. Today she denies SI/HI/AVH, mania, paranoia.    Today Trazodone not restarted.  Patient agreeable to continue all other medications as prescribed.  She will have lithium labs drawn.   No other concerns at this time.       Visit Diagnosis:    ICD-10-CM   1. Bipolar I disorder, most recent episode depressed ([place])  F31.30 benztropine (COGENTIN) 0.5 MG tablet    haloperidol (HALDOL) 5 MG tablet    lithium carbonate 300 MG capsule    FLUoxetine (PROZAC) 40 MG capsule    DISCONTINUED: FLUoxetine (PROZAC) 40 MG capsule    DISCONTINUED: traZODone (DESYREL) 50 MG tablet      Past Psychiatric History: Anxiety and bipolar disorder  Past Medical History:  Past Medical History:  Diagnosis Date   Anxiety    Bipolar 2 disorder ([place])    Discharge from right nipple 11/19/2017   Hypercholesteremia     Past Surgical History:  Procedure Laterality Date   APPENDECTOMY  09/2012   lap appy   BREAST LUMPECTOMY Right 11/19/2017   Procedure: RIGHT BREAST LUMPECTOMY SUBAREOLAR;  Surgeon: Fanny Skates, MD;  Location: [place];  Service: General;  Laterality: Right;   CESAREAN SECTION     LAPAROSCOPIC APPENDECTOMY N/A 09/24/2012   Procedure: APPENDECTOMY LAPAROSCOPIC;  Surgeon: Gwenyth Ober, MD;  Location: [place];  Service: General;  Laterality: N/A;    Family Psychiatric History: Denies  Family History:  Family History  Problem Relation Age of Onset   Diabetes Mother    Hypertension Mother    Diabetes Sister    Hypertension Sister    Cancer Sister 92       breast   Hypertension Brother     Social History:   Social History   Socioeconomic History   Marital status: Married    Spouse name: Not on file   Number of children: Not on file   Years of education: Not on file   Highest education level: Not on file  Occupational History   Not on file  Tobacco Use   Smoking status: Never   Smokeless tobacco: Never  Vaping Use   Vaping Use: Never used  Substance and Sexual Activity   Alcohol use: No   Drug use: No   Sexual activity: Yes    Birth control/protection: Coitus interruptus  Other Topics Concern   Not on file  Social History Narrative   ** Merged History Encounter **       Social Determinants of Health   Financial Resource Strain: Not on file  Food Insecurity: Not on file  Transportation Needs: Not on file  Physical Activity: Not on file  Stress: Not on file  Social Connections: Not on file    Allergies: No Known Allergies  Metabolic Disorder Labs: No results found for: "HGBA1C", "MPG" No results found for: "PROLACTIN" Lab Results  Component Value Date   CHOL 232 (H) 03/26/2010   TRIG 224 (H) 03/26/2010   HDL 31 (L) 03/26/2010   CHOLHDL 7.5 Ratio 03/26/2010   VLDL 45 (H) 03/26/2010   LDLCALC 156 (H) 03/26/2010   LDLCALC 231 (H) 11/22/2009   Lab Results  Component Value Date   TSH 4.344 02/23/2008   TSH 3.595 08/02/2007    Therapeutic Level Labs: Lab Results  Component Value Date   LITHIUM 0.62 (L) 09/24/2012   LITHIUM 0.54 (L) 11/22/2009   No results found for: "VALPROATE" No results found for: "CBMZ"  Current Medications: Current Outpatient Medications  Medication Sig Dispense Refill   atorvastatin (LIPITOR) 80 MG tablet Take 80 mg by mouth daily.     benztropine (COGENTIN) 0.5 MG tablet Take 1 tablet (0.5 mg total) by mouth daily. 30 tablet 3   FLUoxetine (PROZAC) 40 MG capsule Take 1 capsule (40 mg total) by mouth daily. 30 capsule 3   haloperidol (HALDOL) 5 MG tablet Take 1 tablet (5 mg total) by mouth at bedtime. 30 tablet 3    HYDROcodone-acetaminophen (NORCO) 5-325 MG tablet Take 1-2 tablets by mouth every 6 (six) hours as needed for moderate pain or severe pain. 20 tablet 0   lithium carbonate 300 MG capsule Take 2 capsules (600 mg total) by mouth at bedtime. 60 capsule 3   No current facility-administered medications for this visit.     Musculoskeletal: Strength & Muscle Tone:  Unable to assess due to telephone visit Gait & Station:  Unable to assess due to telephone visit Patient leans: N/A  Psychiatric Specialty Exam: Review of Systems  There were no vitals taken for this visit.There is no height or weight on file to calculate BMI.  General Appearance:  Unable to assess due to telephone visit  Eye  Contact:  Unable to assess due to telephone visit  Speech:  Clear and Coherent and Normal Rate  Volume:  Normal  Mood:  Euthymic  Affect:  Appropriate and Congruent  Thought Process:  Coherent, Goal Directed and Linear  Orientation:  Full (Time, Place, and Person)  Thought Content: WDL and Logical   Suicidal Thoughts:  No  Homicidal Thoughts:  No  Memory:  Immediate;   Good Recent;   Good Remote;   Good  Judgement:  Good  Insight:  Good  Psychomotor Activity:  Unable to assess due to telephone visit  Concentration:  Concentration: Good and Attention Span: Good  Recall:  Good  Fund of Knowledge: Good  Language: Good  Akathisia:   Patient denies  Handed:  Right  AIMS (if indicated): Not done, patient denies, telephone visit  Assets:  Communication Skills Desire for Improvement Financial Resources/Insurance Housing Intimacy Social Support  ADL's:  Intact  Cognition: WNL  Sleep:  Good   Screenings: AIMS    Flowsheet Row Video Visit from 03/19/2021 in [place] Total Score 0      GAD-7    Flowsheet Row Video Visit from 11/19/2021 in [facility] Video Visit from 08/19/2021 in [facility] Video  Visit from 03/19/2021 in [facility] Video Visit from 10/09/2020 in [facility] Video Visit from 07/10/2020 in [facility]  Total GAD-7 Score '3 5 4 7 2      '$ PHQ2-9    Flowsheet Row Video Visit from 11/19/2021 in [facility] Video Visit from 08/19/2021 in [facility] Video Visit from 03/19/2021 in [facility] Video Visit from 10/09/2020 in [facility] Video Visit from 07/10/2020 in [place]  PHQ-2 Total Score '1 2 2 1 2  '$ PHQ-9 Total Score '5 10 6 9 16      '$ Flowsheet Row Video Visit from 11/19/2021 in [facility] Video Visit from 10/09/2020 in [facility] Video Visit from 07/10/2020 in [place] Error: Q7 should not be populated when Q6 is No Error: Q7 should not be populated when Q6 is No        Assessment and Plan: Patient notes that she is doing well on her current medication regimen. She reports that she does not wish to restart trazodone and it was discontinued today. She will continue all other medications as prescribed.  Provider reccommended having lithium levels drawn. She endorsed understanding and agreed.   1. Bipolar I disorder, most recent episode depressed ([place])  Continue- benztropine (COGENTIN) 0.5 MG tablet; Take 1 tablet (0.5 mg total) by mouth daily.  Dispense: 30 tablet; Refill: 3 Continue- haloperidol (HALDOL) 5 MG tablet; Take 1 tablet (5 mg total) by mouth at bedtime.  Dispense: 30 tablet; Refill: 3 Continue- lithium carbonate 300 MG capsule; Take 2 capsules (600 mg total) by mouth at bedtime.  Dispense: 60 capsule; Refill: 3 Continue- FLUoxetine (PROZAC) 40 MG capsule; Take 1 capsule (40 mg total) by mouth daily.  Dispense: 30 capsule;  Refill: 3   Follow up in 3 months         Follow up in 3 months Salley Slaughter, NP 11/19/2021, 12:59 PM

## 2022-03-11 ENCOUNTER — Encounter (HOSPITAL_COMMUNITY): Payer: Self-pay | Admitting: Psychiatry

## 2022-03-11 ENCOUNTER — Telehealth (INDEPENDENT_AMBULATORY_CARE_PROVIDER_SITE_OTHER): Payer: No Payment, Other | Admitting: Psychiatry

## 2022-03-11 DIAGNOSIS — F3130 Bipolar disorder, current episode depressed, mild or moderate severity, unspecified: Secondary | ICD-10-CM | POA: Diagnosis not present

## 2022-03-11 MED ORDER — BENZTROPINE MESYLATE 0.5 MG PO TABS: 0.5000 mg | ORAL_TABLET | Freq: Every day | ORAL | 3 refills | Status: DC

## 2022-03-11 MED ORDER — LITHIUM CARBONATE 300 MG PO CAPS: 600.0000 mg | ORAL_CAPSULE | Freq: Every day | ORAL | 3 refills | Status: DC

## 2022-03-11 MED ORDER — FLUOXETINE HCL 40 MG PO CAPS: 40.0000 mg | ORAL_CAPSULE | Freq: Every day | ORAL | 3 refills | Status: DC

## 2022-03-11 MED ORDER — HALOPERIDOL 5 MG PO TABS: 5.0000 mg | ORAL_TABLET | Freq: Every day | ORAL | 3 refills | Status: DC

## 2022-03-11 NOTE — Progress Notes (Signed)
BH MD/PA/NP OP Progress Note Virtual Visit via Video Note  I connected with Vanessa Harrison on 03/11/22 at 11:00 AM EST by a video enabled telemedicine application and verified that I am speaking with the correct person using two identifiers.  Location: Patient: Home Provider: Clinic   I discussed the limitations of evaluation and management by telemedicine and the availability of in person appointments. The patient expressed understanding and agreed to proceed.  I provided 30 minutes of non-face-to-face time during this encounter.    03/11/2022 11:43 AM Vanessa Harrison  MRN:  [id number]  Chief Complaint: "I feel fine" Per husband "At times she forgets"    HPI: 47 year old female seen today for follow up psychiatric evaluation. Provider utilized Spanish speaking interpreter as patient speaks Spanish.  She has a psychiatric history of anxiety and bipolar disorder.  She is currently managed on lithium 600 mg nightly, Haldol 5 mg nightly, Prozac 40 mg daily, and Cogentin 0.5 mg daily. Today she reported that her medications are effective  in managing her psychiatric conditions.   Today she was well-groomed, pleasant, cooperative, and engaged in conversation.  She informed Probation officer that she feels fine.  She notes her mood is stable and reports that she has minimal anxiety and depression. Today provider conducted GAD-7 and patient scored a 2, at her last visit she scored a 3.  Provider also conducted PHQ-9 patient scored an 8, at her last visit she scored a 5.  Patient reports that at times she thinks it would be better if she did not wake up but denies wanting to harm herself.  Today she denies SI/HI/AVH, mania, paranoia.   Patient requested that her husband speak with Probation officer.  Patient husband notes that at times patient is forgetful.  He reports that she will be having a conversation and then forget what she was speaking about.  He informed Probation officer that after being instructed  to have labs drawn by [facility] she was unable to articulate this to him so labs were not drawn.  Provider reordered labs and instructed patient and her husband over to go to have them drawn.  No medication changes made today patient agreeable to continue all other medications as prescribed.  She will have lithium labs drawn.   No other concerns at this time.       Visit Diagnosis:    ICD-10-CM   1. Bipolar I disorder, most recent episode depressed (HCC)  F31.30 Lithium level    Thyroid Panel With TSH    CBC w/Diff/Platelet    HgB A1c    Hepatic function panel    Basic Metabolic Panel (BMET)    benztropine (COGENTIN) 0.5 MG tablet    FLUoxetine (PROZAC) 40 MG capsule    haloperidol (HALDOL) 5 MG tablet    lithium carbonate 300 MG capsule      Past Psychiatric History: Anxiety and bipolar disorder  Past Medical History:  Past Medical History:  Diagnosis Date   Anxiety    Bipolar 2 disorder ([place])    Discharge from right nipple 11/19/2017   Hypercholesteremia     Past Surgical History:  Procedure Laterality Date   APPENDECTOMY  09/2012   lap appy   BREAST LUMPECTOMY Right 11/19/2017   Procedure: RIGHT BREAST LUMPECTOMY SUBAREOLAR;  Surgeon: Fanny Skates, MD;  Location: [place];  Service: General;  Laterality: Right;   CESAREAN SECTION     LAPAROSCOPIC APPENDECTOMY N/A 09/24/2012   Procedure: APPENDECTOMY LAPAROSCOPIC;  Surgeon: Gwenyth Ober, MD;  [facility];  Service: General;  Laterality: N/A;    Family Psychiatric History: Denies  Family History:  Family History  Problem Relation Age of Onset   Diabetes Mother    Hypertension Mother    Diabetes Sister    Hypertension Sister    Cancer Sister 84       breast   Hypertension Brother     Social History:  Social History   Socioeconomic History   Marital status: Married    Spouse name: Not on file   Number of children: Not on file   Years of education: Not on file   Highest education level: Not on file   Occupational History   Not on file  Tobacco Use   Smoking status: Never   Smokeless tobacco: Never  Vaping Use   Vaping Use: Never used  Substance and Sexual Activity   Alcohol use: No   Drug use: No   Sexual activity: Yes    Birth control/protection: Coitus interruptus  Other Topics Concern   Not on file  Social History Narrative   ** Merged History Encounter **       Social Determinants of Health   Financial Resource Strain: Not on file  Food Insecurity: Not on file  Transportation Needs: Not on file  Physical Activity: Not on file  Stress: Not on file  Social Connections: Not on file    Allergies: No Known Allergies  Metabolic Disorder Labs: No results found for: "HGBA1C", "MPG" No results found for: "PROLACTIN" Lab Results  Component Value Date   CHOL 232 (H) 03/26/2010   TRIG 224 (H) 03/26/2010   HDL 31 (L) 03/26/2010   CHOLHDL 7.5 Ratio 03/26/2010   VLDL 45 (H) 03/26/2010   LDLCALC 156 (H) 03/26/2010   LDLCALC 231 (H) 11/22/2009   Lab Results  Component Value Date   TSH 4.344 02/23/2008   TSH 3.595 08/02/2007    Therapeutic Level Labs: Lab Results  Component Value Date   LITHIUM 0.62 (L) 09/24/2012   LITHIUM 0.54 (L) 11/22/2009   No results found for: "VALPROATE" No results found for: "CBMZ"  Current Medications: Current Outpatient Medications  Medication Sig Dispense Refill   atorvastatin (LIPITOR) 80 MG tablet Take 80 mg by mouth daily.     benztropine (COGENTIN) 0.5 MG tablet Take 1 tablet (0.5 mg total) by mouth daily. 30 tablet 3   FLUoxetine (PROZAC) 40 MG capsule Take 1 capsule (40 mg total) by mouth daily. 30 capsule 3   haloperidol (HALDOL) 5 MG tablet Take 1 tablet (5 mg total) by mouth at bedtime. 30 tablet 3   HYDROcodone-acetaminophen (NORCO) 5-325 MG tablet Take 1-2 tablets by mouth every 6 (six) hours as needed for moderate pain or severe pain. 20 tablet 0   lithium carbonate 300 MG capsule Take 2 capsules (600 mg total) by  mouth at bedtime. 60 capsule 3   No current facility-administered medications for this visit.     Musculoskeletal: Strength & Muscle Tone: within normal limits and telehealth visit Gait & Station: normal, telehealth visit Patient leans: N/A  Psychiatric Specialty Exam: Review of Systems  There were no vitals taken for this visit.There is no height or weight on file to calculate BMI.  General Appearance: Well Groomed  Eye Contact:  Unable to assess due to telephone visit  Speech:  Clear and Coherent and Normal Rate  Volume:  Normal  Mood:  Euthymic  Affect:  Appropriate and Congruent  Thought Process:  Coherent, Goal Directed  and Linear  Orientation:  Full (Time, Place, and Person)  Thought Content: WDL and Logical   Suicidal Thoughts:  Yes.  without intent/plan  Homicidal Thoughts:  No  Memory:  Immediate;   Good Recent;   Good Remote;   Good  Judgement:  Good  Insight:  Good  Psychomotor Activity:  Normal  Concentration:  Concentration: Good and Attention Span: Good  Recall:  Good  Fund of Knowledge: Good  Language: Good  Akathisia:  No  Handed:  Right  AIMS (if indicated): Not done, patient denies, telephone visit  Assets:  Communication Skills Desire for Improvement Financial Resources/Insurance Housing Intimacy Social Support  ADL's:  Intact  Cognition: WNL  Sleep:  Good   Screenings: AIMS    Flowsheet Row Video Visit from 03/19/2021 in [place] Total Score 0      GAD-7    Flowsheet Row Video Visit from 03/11/2022 in [facility] Video Visit from 11/19/2021 in [facility] Video Visit from 08/19/2021 in [facility] Video Visit from 03/19/2021 in [facility] Video Visit from 10/09/2020 in [facility]  Total GAD-7 Score '2 3 5 4 7      '$ PHQ2-9    Flowsheet Row Video Visit from  03/11/2022 in [facility] Video Visit from 11/19/2021 in [facility] Video Visit from 08/19/2021 in [facility] Video Visit from 03/19/2021 in [facility] Video Visit from 10/09/2020 in [place]  PHQ-2 Total Score '1 1 2 2 1  '$ PHQ-9 Total Score '8 5 10 6 9      '$ Flowsheet Row Video Visit from 03/11/2022 in [facility] Video Visit from 11/19/2021 in [facility] Video Visit from 10/09/2020 in [place] Error: Q7 should not be populated when Q6 is No        Assessment and Plan: Patient notes that she is doing well on her current medication regimen.  Patient husband reports that she is forgetful.  He reports that she forgets what she is talking about as well as forgot where she should have her labs drawn.  CBC, BMP, LFT, Hgb A1c, and thyroid panel ordered.  No medication changes made today.  Patient agreeable to continue medications as prescribed.    1. Bipolar I disorder, most recent episode depressed (HCC)  - Lithium level - Thyroid Panel With TSH - CBC w/Diff/Platelet - HgB A1c - Hepatic function panel - Basic Metabolic Panel (BMET) Continue- benztropine (COGENTIN) 0.5 MG tablet; Take 1 tablet (0.5 mg total) by mouth daily.  Dispense: 30 tablet; Refill: 3 Continue- FLUoxetine (PROZAC) 40 MG capsule; Take 1 capsule (40 mg total) by mouth daily.  Dispense: 30 capsule; Refill: 3 Continue- haloperidol (HALDOL) 5 MG tablet; Take 1 tablet (5 mg total) by mouth at bedtime.  Dispense: 30 tablet; Refill: 3 Continue- lithium carbonate 300 MG capsule; Take 2 capsules (600 mg total) by mouth at bedtime.  Dispense: 60 capsule; Refill: 3   Follow up in 3 months         Follow up in 3 months Salley Slaughter, NP 03/11/2022, 11:43  AM

## 2022-05-26 ENCOUNTER — Encounter (HOSPITAL_COMMUNITY): Payer: Self-pay | Admitting: Psychiatry

## 2022-05-26 ENCOUNTER — Telehealth (INDEPENDENT_AMBULATORY_CARE_PROVIDER_SITE_OTHER): Payer: Self-pay | Admitting: Psychiatry

## 2022-05-26 DIAGNOSIS — F3130 Bipolar disorder, current episode depressed, mild or moderate severity, unspecified: Secondary | ICD-10-CM

## 2022-05-26 MED ORDER — BENZTROPINE MESYLATE 0.5 MG PO TABS: 0.5000 mg | ORAL_TABLET | Freq: Every day | ORAL | 3 refills | Status: DC

## 2022-05-26 MED ORDER — FLUOXETINE HCL 40 MG PO CAPS: 40.0000 mg | ORAL_CAPSULE | Freq: Every day | ORAL | 3 refills | Status: DC

## 2022-05-26 MED ORDER — HALOPERIDOL 5 MG PO TABS
5.0000 mg | ORAL_TABLET | Freq: Every day | ORAL | 3 refills | Status: DC
Start: 2022-05-26 — End: 2022-11-04

## 2022-05-26 MED ORDER — LITHIUM CARBONATE 300 MG PO CAPS: 600.0000 mg | ORAL_CAPSULE | Freq: Every day | ORAL | 3 refills | Status: DC

## 2022-05-26 NOTE — Progress Notes (Signed)
BH MD/PA/NP OP Progress Note Virtual Visit via Video Note  I connected with Vanessa Harrison on 05/26/22 at 10:00 AM EDT by a video enabled telemedicine application and verified that I am speaking with the correct person using two identifiers.  Location: Patient: Work Provider: Clinic   I discussed the limitations of evaluation and management by telemedicine and the availability of in person appointments. The patient expressed understanding and agreed to proceed.  I provided 30 minutes of non-face-to-face time during this encounter.    05/26/2022 10:37 AM Vanessa Harrison  MRN:  [id number]  Chief Complaint: "I feel fine" Per husband "At times she forgets"    HPI: 46 year old female seen today for follow up psychiatric evaluation. Provider utilized Spanish speaking interpreter as patient speaks Spanish.  She has a psychiatric history of anxiety and bipolar disorder.  She is currently managed on lithium 600 mg nightly, Haldol 5 mg nightly, Prozac 40 mg daily, and Cogentin 0.5 mg daily. Today she reported that her medications are effective  in managing her psychiatric conditions.   Today she was well-groomed, pleasant, cooperative, and engaged in conversation.   She informed Clinical research associate that she recently started working at [employer] and finds enjoyment in her job. She notes that her job is motivating. Patient notes that overall her mood is stable and reports that she has minimal anxiety and depression.  Provider unable to conduct a GAD-7 or PHQ-9 as she was at work.  She does note that she is sleeping and eating adequately.  Today she denies SI/HI/VAH, mania, paranoia.    Patient informed Clinical research associate that she was unable to get her labs drawn because she could not afford them.  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    No medication changes made today.  Patient agreeable to continue medication as prescribed.  No other concerns at  this time.       Visit Diagnosis:    ICD-10-CM   1. Bipolar I disorder, most recent episode depressed  F31.30 benztropine (COGENTIN) 0.5 MG tablet    FLUoxetine (PROZAC) 40 MG capsule    haloperidol (HALDOL) 5 MG tablet    lithium carbonate 300 MG capsule      Past Psychiatric History: Anxiety and bipolar disorder  Past Medical History:  Past Medical History:  Diagnosis Date   Anxiety    Bipolar 2 disorder (HCC)    Discharge from right nipple 11/19/2017   Hypercholesteremia     Past Surgical History:  Procedure Laterality Date   APPENDECTOMY  09/2012   lap appy   BREAST LUMPECTOMY Right 11/19/2017   Procedure: RIGHT BREAST LUMPECTOMY SUBAREOLAR;  Surgeon: Claud Kelp, MD;  Location: [facility];  Service: General;  Laterality: Right;   CESAREAN SECTION     LAPAROSCOPIC APPENDECTOMY N/A 09/24/2012   Procedure: APPENDECTOMY LAPAROSCOPIC;  Surgeon: Cherylynn Ridges, MD;  Location[facility];  Service: General;  Laterality: N/A;    Family Psychiatric History: Denies  Family History:  Family History  Problem Relation Age of Onset   Diabetes Mother    Hypertension Mother    Diabetes Sister    Hypertension Sister    Cancer Sister 22       breast   Hypertension Brother     Social History:  Social History   Socioeconomic History   Marital status: Married    Spouse name: Not on file   Number of children: Not on file  Years of education: Not on file   Highest education level: Not on file  Occupational History   Not on file  Tobacco Use   Smoking status: Never   Smokeless tobacco: Never  Vaping Use   Vaping Use: Never used  Substance and Sexual Activity   Alcohol use: No   Drug use: No   Sexual activity: Yes    Birth control/protection: Coitus interruptus  Other Topics Concern   Not on file  Social History Narrative   ** Merged History Encounter **       Social Determinants of Health   Financial Resource Strain: Not on file  Food Insecurity: Not on file   Transportation Needs: Not on file  Physical Activity: Not on file  Stress: Not on file  Social Connections: Not on file    Allergies: No Known Allergies  Metabolic Disorder Labs: No results found for: "HGBA1C", "MPG" No results found for: "PROLACTIN" Lab Results  Component Value Date   CHOL 232 (H) 03/26/2010   TRIG 224 (H) 03/26/2010   HDL 31 (L) 03/26/2010   CHOLHDL 7.5 Ratio 03/26/2010   VLDL 45 (H) 03/26/2010   LDLCALC 156 (H) 03/26/2010   LDLCALC 231 (H) 11/22/2009   Lab Results  Component Value Date   TSH 4.344 02/23/2008   TSH 3.595 08/02/2007    Therapeutic Level Labs: Lab Results  Component Value Date   LITHIUM 0.62 (L) 09/24/2012   LITHIUM 0.54 (L) 11/22/2009   No results found for: "VALPROATE" No results found for: "CBMZ"  Current Medications: Current Outpatient Medications  Medication Sig Dispense Refill   atorvastatin (LIPITOR) 80 MG tablet Take 80 mg by mouth daily.     benztropine (COGENTIN) 0.5 MG tablet Take 1 tablet (0.5 mg total) by mouth daily. 30 tablet 3   FLUoxetine (PROZAC) 40 MG capsule Take 1 capsule (40 mg total) by mouth daily. 30 capsule 3   haloperidol (HALDOL) 5 MG tablet Take 1 tablet (5 mg total) by mouth at bedtime. 30 tablet 3   HYDROcodone-acetaminophen (NORCO) 5-325 MG tablet Take 1-2 tablets by mouth every 6 (six) hours as needed for moderate pain or severe pain. 20 tablet 0   lithium carbonate 300 MG capsule Take 2 capsules (600 mg total) by mouth at bedtime. 60 capsule 3   No current facility-administered medications for this visit.     Musculoskeletal: Strength & Muscle Tone: within normal limits and telehealth visit Gait & Station: normal, telehealth visit Patient leans: N/A  Psychiatric Specialty Exam: Review of Systems  There were no vitals taken for this visit.There is no height or weight on file to calculate BMI.  General Appearance: Well Groomed  Eye Contact:  Unable to assess due to telephone visit  Speech:   Clear and Coherent and Normal Rate  Volume:  Normal  Mood:  Euthymic  Affect:  Appropriate and Congruent  Thought Process:  Coherent, Goal Directed and Linear  Orientation:  Full (Time, Place, and Person)  Thought Content: WDL and Logical   Suicidal Thoughts:  Yes.  without intent/plan  Homicidal Thoughts:  No  Memory:  Immediate;   Good Recent;   Good Remote;   Good  Judgement:  Good  Insight:  Good  Psychomotor Activity:  Normal  Concentration:  Concentration: Good and Attention Span: Good  Recall:  Good  Fund of Knowledge: Good  Language: Good  Akathisia:  No  Handed:  Right  AIMS (if indicated): Not done, patient denies, telephone visit  Assets:  Communication Skills Desire for Improvement Financial Resources/Insurance Housing Intimacy Social Support  ADL's:  Intact  Cognition: WNL  Sleep:  Good   Screenings: AIMS    Flowsheet Row Video Visit from 03/19/2021 in [facility]  AIMS Total Score 0      GAD-7    Flowsheet Row Video Visit from 03/11/2022 in [facility] Video Visit from 11/19/2021 in [facility] Video Visit from 08/19/2021 in [facility] Video Visit from 03/19/2021 in [facility] Video Visit from 10/09/2020 in [facility]  Total GAD-7 Score PHQ2-9    Flowsheet Row Video Visit from 03/11/2022 in [facility] Video Visit from 11/19/2021 in [facility] Video Visit from 08/19/2021 in [facility] Video Visit from 03/19/2021 in [facility] Video Visit from 10/09/2020 in [facility]  PHQ-2 Total Score PHQ-9 Total Score Flowsheet Row Video Visit from 03/11/2022 in [facility] Video Visit  from 11/19/2021 in [facility] Video Visit from 10/09/2020 in [facility]  C-SSRS RISK CATEGORY Low Risk Low Risk Error: Q7 should not be populated when Q6 is No        Assessment and Plan: Patient notes that she is doing well on her current medication regimen.  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  No medication changes made today.  Patient agreeable to continue medications as prescribed.    1. Bipolar I disorder, most recent episode depressed (HCC)  Continue- benztropine (COGENTIN) 0.5 MG tablet; Take 1 tablet (0.5 mg total) by mouth daily.  Dispense: 30 tablet; Refill: 3 Continue- FLUoxetine (PROZAC) 40 MG capsule; Take 1 capsule (40 mg total) by mouth daily.  Dispense: 30 capsule; Refill: 3 Continue- haloperidol (HALDOL) 5 MG tablet; Take 1 tablet (5 mg total) by mouth at bedtime.  Dispense: 30 tablet; Refill: 3 Continue- lithium carbonate 300 MG capsule; Take 2 capsules (600 mg total) by mouth at bedtime.  Dispense: 60 capsule; Refill: 3   Follow up in 2 months         Follow up in 3 months Shanna Cisco, NP 05/26/2022, 10:37 AM

## 2022-07-13 ENCOUNTER — Other Ambulatory Visit (HOSPITAL_COMMUNITY): Payer: Self-pay | Admitting: Psychiatry

## 2022-07-13 DIAGNOSIS — F3130 Bipolar disorder, current episode depressed, mild or moderate severity, unspecified: Secondary | ICD-10-CM

## 2022-07-25 ENCOUNTER — Other Ambulatory Visit (HOSPITAL_COMMUNITY): Payer: Self-pay | Admitting: Psychiatry

## 2022-07-25 DIAGNOSIS — F3130 Bipolar disorder, current episode depressed, mild or moderate severity, unspecified: Secondary | ICD-10-CM

## 2022-08-07 ENCOUNTER — Telehealth (HOSPITAL_COMMUNITY): Payer: No Payment, Other | Admitting: Psychiatry

## 2022-08-12 ENCOUNTER — Encounter (HOSPITAL_COMMUNITY): Payer: Self-pay

## 2022-08-12 ENCOUNTER — Telehealth (HOSPITAL_COMMUNITY): Payer: No Payment, Other | Admitting: Psychiatry

## 2022-11-04 ENCOUNTER — Encounter (HOSPITAL_COMMUNITY): Payer: Self-pay | Admitting: Psychiatry

## 2022-11-04 ENCOUNTER — Telehealth (INDEPENDENT_AMBULATORY_CARE_PROVIDER_SITE_OTHER): Payer: No Payment, Other | Admitting: Psychiatry

## 2022-11-04 DIAGNOSIS — F3130 Bipolar disorder, current episode depressed, mild or moderate severity, unspecified: Secondary | ICD-10-CM

## 2022-11-04 MED ORDER — LITHIUM CARBONATE 300 MG PO CAPS
600.0000 mg | ORAL_CAPSULE | Freq: Every day | ORAL | 0 refills | Status: DC
Start: 2022-11-04 — End: 2023-01-28

## 2022-11-04 MED ORDER — BENZTROPINE MESYLATE 0.5 MG PO TABS
0.5000 mg | ORAL_TABLET | Freq: Every day | ORAL | 3 refills | Status: DC
Start: 2022-11-04 — End: 2023-01-28

## 2022-11-04 MED ORDER — HALOPERIDOL 5 MG PO TABS
5.0000 mg | ORAL_TABLET | Freq: Every day | ORAL | 3 refills | Status: DC
Start: 2022-11-04 — End: 2022-12-01

## 2022-11-04 MED ORDER — FLUOXETINE HCL 40 MG PO CAPS
40.0000 mg | ORAL_CAPSULE | Freq: Every day | ORAL | 0 refills | Status: DC
Start: 2022-11-04 — End: 2023-01-28

## 2022-11-04 NOTE — Progress Notes (Signed)
BH MD/PA/NP OP Progress Note Virtual Visit via Video Note  I connected with Vanessa Harrison on 11/04/22 at  3:00 PM EDT by a video enabled telemedicine application and verified that I am speaking with the correct person using two identifiers.  Location: Patient: Work Provider: Clinic   I discussed the limitations of evaluation and management by telemedicine and the availability of in person appointments. The patient expressed understanding and agreed to proceed.  I provided 30 minutes of non-face-to-face time during this encounter.    11/04/2022 11:31 AM Vanessa Harrison  MRN:  [id number]  Chief Complaint: "I can't remember sometimes"    HPI: 47 year old female seen today for follow up psychiatric evaluation. Provider utilized Spanish speaking interpreter as patient speaks Spanish.  She has a psychiatric history of anxiety and bipolar disorder.  She is currently managed on lithium 600 mg nightly, Haldol 5 mg nightly, Prozac 40 mg daily, and Cogentin 0.5 mg daily. Today she reported that her medications are effective  in managing her psychiatric conditions.   Today she was well-groomed, pleasant, cooperative, and engaged in conversation.   She informed Clinical research associate that she she has been having issues remembering.  She notes that she is forgetful and informed Clinical research associate that her family members often have to repeat what they say.  Provider recommended having labs drawn.  Patient does not have a current lithium lab on file.  She was agreeable to having labs drawn on October 2.  Overall patient notes that her mood is stable and notes that she has minimal anxiety and depression.  Provider conducted a PHQ-9 and patient scored a 8. Today she denies SI/HI/VAH, mania, paranoia.  She endorses adequate sleep and appetite.  No medication changes made today.  Patient agreeable to continue medication as prescribed.  Provider ordered prolactin level, lithium level, CBC, CMP, LFT, thyroid panel,  lipid panel, and HgbA1c.  No other concerns at this time.       Visit Diagnosis:    ICD-10-CM   1. Bipolar I disorder, most recent episode depressed (HCC)  F31.30 Lithium level    HgB A1c    CBC w/Diff/Platelet    Hepatic function panel    Comprehensive Metabolic Panel (CMET)    Urine Drug Panel 7    Lipid Profile    Prolactin    lithium carbonate 300 MG capsule    haloperidol (HALDOL) 5 MG tablet    FLUoxetine (PROZAC) 40 MG capsule    benztropine (COGENTIN) 0.5 MG tablet    Thyroid Panel With TSH    CANCELED: Thyroid Panel With TSH       Past Psychiatric History: Anxiety and bipolar disorder  Past Medical History:  Past Medical History:  Diagnosis Date   Anxiety    Bipolar 2 disorder (HCC)    Discharge from right nipple 11/19/2017   Hypercholesteremia     Past Surgical History:  Procedure Laterality Date   APPENDECTOMY  09/2012   lap appy   BREAST LUMPECTOMY Right 11/19/2017   Procedure: RIGHT BREAST LUMPECTOMY SUBAREOLAR;  Surgeon: Claud Kelp, MD;  Location: [facility] OR;  Service: General;  Laterality: Right;   CESAREAN SECTION     LAPAROSCOPIC APPENDECTOMY N/A 09/24/2012   Procedure: APPENDECTOMY LAPAROSCOPIC;  Surgeon: Cherylynn Ridges, MD;  Location: [facility] OR;  Service: General;  Laterality: N/A;    Family Psychiatric History: Denies  Family History:  Family History  Problem Relation Age of Onset   Diabetes Mother    Hypertension Mother  Diabetes Sister    Hypertension Sister    Cancer Sister 64       breast   Hypertension Brother     Social History:  Social History   Socioeconomic History   Marital status: Married    Spouse name: Not on file   Number of children: Not on file   Years of education: Not on file   Highest education level: Not on file  Occupational History   Not on file  Tobacco Use   Smoking status: Never   Smokeless tobacco: Never  Vaping Use   Vaping status: Never Used  Substance and Sexual Activity   Alcohol use: No   Drug  use: No   Sexual activity: Yes    Birth control/protection: Coitus interruptus  Other Topics Concern   Not on file  Social History Narrative   ** Merged History Encounter **       Social Determinants of Health   Financial Resource Strain: Not on file  Food Insecurity: Not on file  Transportation Needs: Not on file  Physical Activity: Not on file  Stress: Not on file  Social Connections: Not on file    Allergies: No Known Allergies  Metabolic Disorder Labs: No results found for: "HGBA1C", "MPG" No results found for: "PROLACTIN" Lab Results  Component Value Date   CHOL 232 (H) 03/26/2010   TRIG 224 (H) 03/26/2010   HDL 31 (L) 03/26/2010   CHOLHDL 7.5 Ratio 03/26/2010   VLDL 45 (H) 03/26/2010   LDLCALC 156 (H) 03/26/2010   LDLCALC 231 (H) 11/22/2009   Lab Results  Component Value Date   TSH 4.344 02/23/2008   TSH 3.595 08/02/2007    Therapeutic Level Labs: Lab Results  Component Value Date   LITHIUM 0.62 (L) 09/24/2012   LITHIUM 0.54 (L) 11/22/2009   No results found for: "VALPROATE" No results found for: "CBMZ"  Current Medications: Current Outpatient Medications  Medication Sig Dispense Refill   atorvastatin (LIPITOR) 80 MG tablet Take 80 mg by mouth daily.     benztropine (COGENTIN) 0.5 MG tablet Take 1 tablet (0.5 mg total) by mouth daily. 30 tablet 3   FLUoxetine (PROZAC) 40 MG capsule Take 1 capsule (40 mg total) by mouth daily. 30 capsule 0   haloperidol (HALDOL) 5 MG tablet Take 1 tablet (5 mg total) by mouth at bedtime. 30 tablet 3   HYDROcodone-acetaminophen (NORCO) 5-325 MG tablet Take 1-2 tablets by mouth every 6 (six) hours as needed for moderate pain or severe pain. 20 tablet 0   lithium carbonate 300 MG capsule Take 2 capsules (600 mg total) by mouth at bedtime. 60 capsule 0   No current facility-administered medications for this visit.     Musculoskeletal: Strength & Muscle Tone: within normal limits and telehealth visit Gait & Station:  normal, telehealth visit Patient leans: N/A  Psychiatric Specialty Exam: Review of Systems  There were no vitals taken for this visit.There is no height or weight on file to calculate BMI.  General Appearance: Well Groomed  Eye Contact: good  Speech:  Clear and Coherent and Normal Rate  Volume:  Normal  Mood:  Euthymic  Affect:  Appropriate and Congruent  Thought Process:  Coherent, Goal Directed and Linear  Orientation:  Full (Time, Place, and Person)  Thought Content: WDL and Logical   Suicidal Thoughts:  No  Homicidal Thoughts:  No  Memory:  Immediate;   Good Recent;   Good Remote;   Good  Judgement:  Good  Insight:  Good  Psychomotor Activity:  Normal  Concentration:  Concentration: Good and Attention Span: Good  Recall:  Good  Fund of Knowledge: Good  Language: Good  Akathisia:  No  Handed:  Right  AIMS (if indicated): Not done,   Assets:  Communication Skills Desire for Improvement Financial Resources/Insurance Housing Intimacy Social Support  ADL's:  Intact  Cognition: WNL  Sleep:  Good   Screenings: AIMS    Flowsheet Row Video Visit from 03/19/2021 in [facility]  AIMS Total Score 0      GAD-7    Flowsheet Row Video Visit from 03/11/2022 in [facility] Video Visit from 11/19/2021 in [facility] Video Visit from 08/19/2021 in [facility] Video Visit from 03/19/2021 in [facility] Video Visit from 10/09/2020 in [facility]  Total GAD-7 Score 2 3 5 4 7       PHQ2-9    Flowsheet Row Video Visit from 11/04/2022 in [facility] Video Visit from 03/11/2022 in [facility] Video Visit from 11/19/2021 in [facility] Video Visit from 08/19/2021 in [facility] Video Visit from 03/19/2021 in  [facility]  PHQ-2 Total Score 0 1 1 2 2   PHQ-9 Total Score 8 8 5 10 6       Flowsheet Row Video Visit from 11/04/2022 in [facility] Video Visit from 03/11/2022 in [facility] Video Visit from 11/19/2021 in [facility]  C-SSRS RISK CATEGORY Low Risk Low Risk Low Risk        Assessment and Plan: Patient notes that she is doing well on her current medication regimen.  She reports that she has been more forgetful. No medication changes made today.  Patient agreeable to continue medication as prescribed.  Provider ordered prolactin level, lithium level, CBC, CMP, LFT, thyroid panel, lipid panel, and HgbA1c.  1. Bipolar I disorder, most recent episode depressed (HCC)  - Lithium level - HgB A1c - CBC w/Diff/Platelet - Hepatic function panel - Comprehensive Metabolic Panel (CMET) - Urine Drug Panel 7 - Lipid Profile - Prolactin Continue- lithium carbonate 300 MG capsule; Take 2 capsules (600 mg total) by mouth at bedtime.  Dispense: 60 capsule; Refill: 0 Continue- haloperidol (HALDOL) 5 MG tablet; Take 1 tablet (5 mg total) by mouth at bedtime.  Dispense: 30 tablet; Refill: 3 Continue- FLUoxetine (PROZAC) 40 MG capsule; Take 1 capsule (40 mg total) by mouth daily.  Dispense: 30 capsule; Refill: 0 Continue- benztropine (COGENTIN) 0.5 MG tablet; Take 1 tablet (0.5 mg total) by mouth daily.  Dispense: 30 tablet; Refill: 3 - Thyroid Panel With TSH   Follow-up with lab in 1 month Follow-up for medication management in 2.5 months          Follow up in 3 months Shanna Cisco, NP 11/04/2022, 11:31 AM

## 2022-11-09 ENCOUNTER — Other Ambulatory Visit (HOSPITAL_COMMUNITY): Payer: No Payment, Other

## 2022-11-11 ENCOUNTER — Other Ambulatory Visit (INDEPENDENT_AMBULATORY_CARE_PROVIDER_SITE_OTHER): Payer: No Payment, Other

## 2022-11-11 ENCOUNTER — Other Ambulatory Visit: Payer: Self-pay | Admitting: Psychiatry

## 2022-11-11 DIAGNOSIS — Z79899 Other long term (current) drug therapy: Secondary | ICD-10-CM

## 2022-11-11 DIAGNOSIS — F3130 Bipolar disorder, current episode depressed, mild or moderate severity, unspecified: Secondary | ICD-10-CM

## 2022-11-11 NOTE — Progress Notes (Signed)
Patient arrived today for her due labs. Patient was pleasant and cooperative. I prepared patients right AC but did not find a vein, I moved down to the right hand and venipuncture was preformed using a 23g butterfly needle. Patient tolerated well and without complaint.

## 2022-11-12 LAB — CBC WITH DIFFERENTIAL/PLATELET
Basophils Absolute: 0 10*3/uL (ref 0.0–0.2)
Basos: 0 %
EOS (ABSOLUTE): 0.1 10*3/uL (ref 0.0–0.4)
Eos: 2 %
Hematocrit: 39.9 % (ref 34.0–46.6)
Hemoglobin: 12.3 g/dL (ref 11.1–15.9)
Immature Grans (Abs): 0 10*3/uL (ref 0.0–0.1)
Immature Granulocytes: 0 %
Lymphocytes Absolute: 2.4 10*3/uL (ref 0.7–3.1)
Lymphs: 27 %
MCH: 28.7 pg (ref 26.6–33.0)
MCHC: 30.8 g/dL — ABNORMAL LOW (ref 31.5–35.7)
MCV: 93 fL (ref 79–97)
Monocytes Absolute: 0.4 10*3/uL (ref 0.1–0.9)
Monocytes: 4 %
Neutrophils Absolute: 5.8 10*3/uL (ref 1.4–7.0)
Neutrophils: 67 %
Platelets: 317 10*3/uL (ref 150–450)
RBC: 4.28 x10E6/uL (ref 3.77–5.28)
RDW: 13.3 % (ref 11.7–15.4)
WBC: 8.7 10*3/uL (ref 3.4–10.8)

## 2022-11-12 LAB — COMPREHENSIVE METABOLIC PANEL
ALT: 16 [IU]/L (ref 0–32)
AST: 17 [IU]/L (ref 0–40)
Albumin: 4.3 g/dL (ref 3.9–4.9)
Alkaline Phosphatase: 123 [IU]/L — ABNORMAL HIGH (ref 44–121)
BUN/Creatinine Ratio: 16 (ref 9–23)
BUN: 10 mg/dL (ref 6–24)
Bilirubin Total: 0.4 mg/dL (ref 0.0–1.2)
CO2: 18 mmol/L — ABNORMAL LOW (ref 20–29)
Calcium: 10.1 mg/dL (ref 8.7–10.2)
Chloride: 108 mmol/L — ABNORMAL HIGH (ref 96–106)
Creatinine, Ser: 0.64 mg/dL (ref 0.57–1.00)
Globulin, Total: 2.6 g/dL (ref 1.5–4.5)
Glucose: 87 mg/dL (ref 70–99)
Potassium: 4.5 mmol/L (ref 3.5–5.2)
Sodium: 141 mmol/L (ref 134–144)
Total Protein: 6.9 g/dL (ref 6.0–8.5)
eGFR: 108 mL/min/{1.73_m2} (ref >59)

## 2022-11-12 LAB — HEPATIC FUNCTION PANEL: Bilirubin, Direct: <0.1 mg/dL (ref 0.00–0.40)

## 2022-11-12 LAB — LIPID PANEL
Chol/HDL Ratio: 5.4 {ratio} — ABNORMAL HIGH (ref 0.0–4.4)
Cholesterol, Total: 204 mg/dL — ABNORMAL HIGH (ref 100–199)
HDL: 38 mg/dL — ABNORMAL LOW (ref >39)
LDL Chol Calc (NIH): 149 mg/dL — ABNORMAL HIGH (ref 0–99)
Triglycerides: 93 mg/dL (ref 0–149)
VLDL Cholesterol Cal: 17 mg/dL (ref 5–40)

## 2022-11-12 LAB — HEMOGLOBIN A1C
Est. average glucose Bld gHb Est-mCnc: 111 mg/dL
Hgb A1c MFr Bld: 5.5 % (ref 4.8–5.6)

## 2022-11-12 LAB — PROLACTIN: Prolactin: 46.3 ng/mL — ABNORMAL HIGH (ref 4.8–33.4)

## 2022-11-12 LAB — THYROID PANEL WITH TSH
Free Thyroxine Index: 2.3 (ref 1.2–4.9)
T3 Uptake Ratio: 27 % (ref 24–39)
T4, Total: 8.4 ug/dL (ref 4.5–12.0)
TSH: 2.11 u[IU]/mL (ref 0.450–4.500)

## 2022-11-20 NOTE — Progress Notes (Signed)
Provider called to discuss patients lab results. Provider informed patient that her prolactin level maybe increased because she is on haldol. Provider informed her that if its is elevated when we recheck her labs then she will be referred to endocrinology. She endorsed understanding and agreed. She notes that she will follow up with her PCP regarding her cholesterol. No other concerns note at this time.

## 2022-11-23 ENCOUNTER — Other Ambulatory Visit (HOSPITAL_COMMUNITY): Payer: No Payment, Other

## 2022-12-01 ENCOUNTER — Other Ambulatory Visit (HOSPITAL_COMMUNITY): Payer: Self-pay | Admitting: Psychiatry

## 2022-12-01 DIAGNOSIS — F3130 Bipolar disorder, current episode depressed, mild or moderate severity, unspecified: Secondary | ICD-10-CM

## 2023-01-27 ENCOUNTER — Telehealth (HOSPITAL_COMMUNITY): Payer: No Payment, Other | Admitting: Psychiatry

## 2023-01-28 ENCOUNTER — Other Ambulatory Visit (HOSPITAL_COMMUNITY): Payer: Self-pay | Admitting: Psychiatry

## 2023-01-28 ENCOUNTER — Encounter (HOSPITAL_COMMUNITY): Payer: Self-pay | Admitting: Psychiatry

## 2023-01-28 ENCOUNTER — Telehealth (HOSPITAL_COMMUNITY): Payer: No Payment, Other | Admitting: Psychiatry

## 2023-01-28 DIAGNOSIS — R413 Other amnesia: Secondary | ICD-10-CM

## 2023-01-28 DIAGNOSIS — F3130 Bipolar disorder, current episode depressed, mild or moderate severity, unspecified: Secondary | ICD-10-CM | POA: Diagnosis not present

## 2023-01-28 MED ORDER — LITHIUM CARBONATE 300 MG PO CAPS
600.0000 mg | ORAL_CAPSULE | Freq: Every day | ORAL | 3 refills | Status: DC
Start: 2023-01-28 — End: 2023-06-07

## 2023-01-28 MED ORDER — FLUOXETINE HCL 40 MG PO CAPS
40.0000 mg | ORAL_CAPSULE | Freq: Every day | ORAL | 3 refills | Status: DC
Start: 2023-01-28 — End: 2023-04-08

## 2023-01-28 MED ORDER — HALOPERIDOL 5 MG PO TABS
5.0000 mg | ORAL_TABLET | Freq: Every day | ORAL | 3 refills | Status: DC
Start: 2023-01-28 — End: 2023-04-08

## 2023-01-28 MED ORDER — BENZTROPINE MESYLATE 0.5 MG PO TABS
0.5000 mg | ORAL_TABLET | Freq: Every day | ORAL | 3 refills | Status: DC
Start: 2023-01-28 — End: 2023-04-08

## 2023-01-28 NOTE — Progress Notes (Signed)
BH MD/PA/NP OP Progress Note Virtual Visit via Video Note  I connected with Vanessa Harrison on 01/28/23 at 12:30 PM EST by a video enabled telemedicine application and verified that I am speaking with the correct person using two identifiers.  Location: Patient: Work Provider: Clinic   I discussed the limitations of evaluation and management by telemedicine and the availability of in person appointments. The patient expressed understanding and agreed to proceed.  I provided 30 minutes of non-face-to-face time during this encounter.    01/28/2023 12:57 PM Vanessa Harrison  MRN:  [id number]  Chief Complaint: "I have problems concentrating and with memory"    HPI: 47 year old female seen today for follow up psychiatric evaluation. Provider utilized Spanish speaking interpreter as patient speaks Spanish.  She has a psychiatric history of anxiety and bipolar disorder.  She is currently managed on lithium 600 mg nightly, Haldol 5 mg nightly, Prozac 40 mg daily, and Cogentin 0.5 mg daily. Today she reported that her medications are effective somewhat  in managing her psychiatric conditions.   Today she logged in but her camera was turned off. During exam was pleasant, cooperative, and engaged in conversation.   She informed Clinical research associate that she has  been doing well but continues to have problems with concentration and memory. Provider dicussed patients prolactin level, CBC, CMP, LFT, thyroid panel, lipid panel, and Hgb A1c. She was informed that her lithium lab did not result and informed her that if she is in lithium toxicity this may explain her her forgetfulness. She endorsed understanding and agreed.    Patient notes that overall her mood is stable and notes that she has minimal anxiety and depression. Today provider conducted a GAD 7 and patient scored a 0. Provider also conducted a PHQ 9 and patient scored a 5 , at her last visit she scored an 8. Today she denies SI/HI/VAH,  mania, paranoia.  She endorses adequate sleep and appetite.  No medication changes made today.  Patient agreeable to continue medication as prescribed.  Provider ordered lithium level and referred patient to [facility] for [facility]. Provider informed patient that neurology can be consulted if forgetfulness does not improve pending lithium level results and appointment with PCP. She endorsed understanding and agreed.  No other concerns at this time.       Visit Diagnosis:    ICD-10-CM   1. Episodic memory loss  R41.3 Ambulatory referral to Internal Medicine    2. Bipolar I disorder, most recent episode depressed (HCC)  F31.30 Lithium level    benztropine (COGENTIN) 0.5 MG tablet    FLUoxetine (PROZAC) 40 MG capsule    haloperidol (HALDOL) 5 MG tablet    lithium carbonate 300 MG capsule        Past Psychiatric History: Anxiety and bipolar disorder  Past Medical History:  Past Medical History:  Diagnosis Date   Anxiety    Bipolar 2 disorder (HCC)    Discharge from right nipple 11/19/2017   Hypercholesteremia     Past Surgical History:  Procedure Laterality Date   APPENDECTOMY  09/2012   lap appy   BREAST LUMPECTOMY Right 11/19/2017   Procedure: RIGHT BREAST LUMPECTOMY SUBAREOLAR;  Surgeon: Claud Kelp, MD;  Location: [facility] OR;  Service: General;  Laterality: Right;   CESAREAN SECTION     LAPAROSCOPIC APPENDECTOMY N/A 09/24/2012   Procedure: APPENDECTOMY LAPAROSCOPIC;  Surgeon: Cherylynn Ridges, MD;  Location: [facility] OR;  Service: General;  Laterality: N/A;    Family  Psychiatric History: Denies  Family History:  Family History  Problem Relation Age of Onset   Diabetes Mother    Hypertension Mother    Diabetes Sister    Hypertension Sister    Cancer Sister 23       breast   Hypertension Brother     Social History:  Social History   Socioeconomic History   Marital status: Married    Spouse name: Not on file   Number of children: Not on file    Years of education: Not on file   Highest education level: Not on file  Occupational History   Not on file  Tobacco Use   Smoking status: Never   Smokeless tobacco: Never  Vaping Use   Vaping status: Never Used  Substance and Sexual Activity   Alcohol use: No   Drug use: No   Sexual activity: Yes    Birth control/protection: Coitus interruptus  Other Topics Concern   Not on file  Social History Narrative   ** Merged History Encounter **       Social Drivers of Health   Financial Resource Strain: Not on file  Food Insecurity: Not on file  Transportation Needs: Not on file  Physical Activity: Not on file  Stress: Not on file  Social Connections: Not on file    Allergies: No Known Allergies  Metabolic Disorder Labs: Lab Results  Component Value Date   HGBA1C 5.5 11/11/2022   Lab Results  Component Value Date   PROLACTIN 46.3 (H) 11/11/2022   Lab Results  Component Value Date   CHOL 204 (H) 11/11/2022   TRIG 93 11/11/2022   HDL 38 (L) 11/11/2022   CHOLHDL 5.4 (H) 11/11/2022   VLDL 45 (H) 03/26/2010   LDLCALC 149 (H) 11/11/2022   LDLCALC 156 (H) 03/26/2010   Lab Results  Component Value Date   TSH 2.110 11/11/2022   TSH 4.344 02/23/2008    Therapeutic Level Labs: Lab Results  Component Value Date   LITHIUM 0.62 (L) 09/24/2012   LITHIUM 0.54 (L) 11/22/2009   No results found for: "VALPROATE" No results found for: "CBMZ"  Current Medications: Current Outpatient Medications  Medication Sig Dispense Refill   atorvastatin (LIPITOR) 80 MG tablet Take 80 mg by mouth daily.     benztropine (COGENTIN) 0.5 MG tablet Take 1 tablet (0.5 mg total) by mouth daily. 30 tablet 3   FLUoxetine (PROZAC) 40 MG capsule Take 1 capsule (40 mg total) by mouth daily. 30 capsule 3   haloperidol (HALDOL) 5 MG tablet Take 1 tablet (5 mg total) by mouth at bedtime. 30 tablet 3   HYDROcodone-acetaminophen (NORCO) 5-325 MG tablet Take 1-2 tablets by mouth every 6 (six) hours as  needed for moderate pain or severe pain. 20 tablet 0   lithium carbonate 300 MG capsule Take 2 capsules (600 mg total) by mouth at bedtime. 60 capsule 3   No current facility-administered medications for this visit.     Musculoskeletal: Strength & Muscle Tone: within normal limits and telehealth visit Gait & Station: normal, telehealth visit Patient leans: N/A  Psychiatric Specialty Exam: Review of Systems  There were no vitals taken for this visit.There is no height or weight on file to calculate BMI.  General Appearance: Well Groomed  Eye Contact: good  Speech:  Clear and Coherent and Normal Rate  Volume:  Normal  Mood:  Euthymic  Affect:  Appropriate and Congruent  Thought Process:  Coherent, Goal Directed and Linear  Orientation:  Full (Time, Place, and Person)  Thought Content: WDL and Logical   Suicidal Thoughts:  No  Homicidal Thoughts:  No  Memory:  Immediate;   Good Recent;   Good Remote;   Good  Judgement:  Good  Insight:  Good  Psychomotor Activity:  Normal  Concentration:  Concentration: Good and Attention Span: Good  Recall:  Good  Fund of Knowledge: Good  Language: Good  Akathisia:  No  Handed:  Right  AIMS (if indicated): Not done,   Assets:  Communication Skills Desire for Improvement Financial Resources/Insurance Housing Intimacy Social Support  ADL's:  Intact  Cognition: WNL  Sleep:  Good   Screenings: AIMS    Flowsheet Row Video Visit from 03/19/2021 in [facility]  AIMS Total Score 0      GAD-7    Flowsheet Row Video Visit from 01/28/2023 in [facility] Video Visit from 03/11/2022 in [facility] Video Visit from 11/19/2021 in [facility] Video Visit from 08/19/2021 in [facility] Video Visit from 03/19/2021 in [facility]  Total GAD-7 Score 0 2 3 5 4       PHQ2-9     Flowsheet Row Video Visit from 01/28/2023 in [facility] Video Visit from 11/04/2022 in [facility] Video Visit from 03/11/2022 in [facility] Video Visit from 11/19/2021 in [facility] Video Visit from 08/19/2021 in [facility]  PHQ-2 Total Score 0 0 1 1 2   PHQ-9 Total Score 5 8 8 5 10       Flowsheet Row Video Visit from 11/04/2022 in [facility] Video Visit from 03/11/2022 in [facility] Video Visit from 11/19/2021 in [facility]  C-SSRS RISK CATEGORY Low Risk Low Risk Low Risk        Assessment and Plan: Patient notes that she is doing well on her current medication regimen however is forgetful. Provider dicussed patients prolactin level, CBC, CMP, LFT, thyroid panel, lipid panel, and Hgb A1c. She was informed that her lithium lab did not result and informed her that if she is in lithium toxicity this may explain her her forgetfulness. She endorsed understanding and agreed. No medication changes made today.  Patient agreeable to continue medication as prescribed.  Provider ordered lithium level and referred patient to [facility] for primary care. Provider informed patient that neurology can be consulted if forgetfulness does not improve pending lithium level results and appointment with PCP. She endorsed understanding and agreed.   1. Bipolar I disorder, most recent episode depressed (HCC)  - Lithium level; Future Continue- benztropine (COGENTIN) 0.5 MG tablet; Take 1 tablet (0.5 mg total) by mouth daily.  Dispense: 30 tablet; Refill: 3 Continue- FLUoxetine (PROZAC) 40 MG capsule; Take 1 capsule (40 mg total) by mouth daily.  Dispense: 30 capsule; Refill: 3 Continue- haloperidol (HALDOL) 5 MG tablet; Take 1 tablet (5 mg total) by mouth at bedtime.   Dispense: 30 tablet; Refill: 3 Continue- lithium carbonate 300 MG capsule; Take 2 capsules (600 mg total) by mouth at bedtime.  Dispense: 60 capsule; Refill: 3  2. Episodic memory loss (Primary)  - Ambulatory referral to Internal Medicine  Follow-up for medication management in 2.5 months          Follow up in 3 months Shanna Cisco, NP  01/28/2023, 12:57 PM

## 2023-04-08 ENCOUNTER — Telehealth (HOSPITAL_COMMUNITY): Payer: No Payment, Other | Admitting: Psychiatry

## 2023-04-08 ENCOUNTER — Encounter (HOSPITAL_COMMUNITY): Payer: Self-pay | Admitting: Psychiatry

## 2023-04-08 DIAGNOSIS — F3130 Bipolar disorder, current episode depressed, mild or moderate severity, unspecified: Secondary | ICD-10-CM

## 2023-04-08 MED ORDER — BENZTROPINE MESYLATE 0.5 MG PO TABS: 0.5000 mg | ORAL_TABLET | Freq: Every day | ORAL | 3 refills | Status: DC

## 2023-04-08 MED ORDER — HALOPERIDOL 5 MG PO TABS
5.0000 mg | ORAL_TABLET | Freq: Every day | ORAL | 3 refills | Status: DC
Start: 2023-04-08 — End: 2023-07-01

## 2023-04-08 MED ORDER — FLUOXETINE HCL 40 MG PO CAPS: 40.0000 mg | ORAL_CAPSULE | Freq: Every day | ORAL | 3 refills | Status: DC

## 2023-04-08 NOTE — Progress Notes (Signed)
 BH MD/PA/NP OP Progress Note Virtual Visit via Video Note  I connected with Vanessa Harrison on 04/08/23 at  3:00 PM EST by a video enabled telemedicine application and verified that I am speaking with the correct person using two identifiers.  Location: Patient: Work Provider: Clinic   I discussed the limitations of evaluation and management by telemedicine and the availability of in person appointments. The patient expressed understanding and agreed to proceed.  I provided 30 minutes of non-face-to-face time during this encounter.    04/08/2023 1:28 PM Vanessa Harrison  MRN:  [id number]  Chief Complaint: "There is three nights that I feel someone is watching me"    HPI: 48 year old female seen today for follow up psychiatric evaluation. Provider utilized Spanish speaking interpreter as patient speaks Spanish.  She has a psychiatric history of anxiety and bipolar disorder.  She is currently managed on lithium 600 mg nightly, Haldol 5 mg nightly, Prozac 40 mg daily, and Cogentin 0.5 mg daily. Today she reported that her medications are effective   in managing her psychiatric conditions.   Today she logged in but her camera was turned off. During exam was pleasant, cooperative, and engaged in conversation.   She informed Clinical research associate that for the last three nights she feels that someone has been watching her. She notes that she does not have AVH. She also notes that her sleep is adequate and denies increased stressors.   Patient informed writer that her mood is stable and notes that she has minimal anxiety and depression.  She does note that she continues to be forgetful and disorganized.  Provider informed patient that her lithium labs needs to be conducted.  She notes that she will come into the clinic and have them done.  At this time a GAD-7 and PHQ-9 not done. Today she denies SI/HI/VAH or mania,   She endorses adequate sleep and appetite.  No medication changes made today.   Patient agreeable to continue medication as prescribed.  Provider ordered lithium level. Provider informed patient that neurology can be consulted if forgetfulness does not improve pending lithium level results. She endorsed understanding and agreed. Provider also ordered CBC, CMP, lipid panel, LFT, thyroid panel, and prolactin level.  No other concerns at this time.       Visit Diagnosis:    ICD-10-CM   1. Bipolar I disorder, most recent episode depressed (HCC)  F31.30 benztropine (COGENTIN) 0.5 MG tablet    FLUoxetine (PROZAC) 40 MG capsule    haloperidol (HALDOL) 5 MG tablet    Lithium level    CBC w/Diff/Platelet    Comprehensive Metabolic Panel (CMET)    Hepatic function panel    Thyroid Panel With TSH    Prolactin         Past Psychiatric History: Anxiety and bipolar disorder  Past Medical History:  Past Medical History:  Diagnosis Date   Anxiety    Bipolar 2 disorder (HCC)    Discharge from right nipple 11/19/2017   Hypercholesteremia     Past Surgical History:  Procedure Laterality Date   APPENDECTOMY  09/2012   lap appy   BREAST LUMPECTOMY Right 11/19/2017   Procedure: RIGHT BREAST LUMPECTOMY SUBAREOLAR;  Surgeon: Claud Kelp, MD;  Location: [facility] OR;  Service: General;  Laterality: Right;   CESAREAN SECTION     LAPAROSCOPIC APPENDECTOMY N/A 09/24/2012   Procedure: APPENDECTOMY LAPAROSCOPIC;  Surgeon: Cherylynn Ridges, MD;  Location: [facility] OR;  Service: General;  Laterality: N/A;    Family  Psychiatric History: Denies  Family History:  Family History  Problem Relation Age of Onset   Diabetes Mother    Hypertension Mother    Diabetes Sister    Hypertension Sister    Cancer Sister 63       breast   Hypertension Brother     Social History:  Social History   Socioeconomic History   Marital status: Married    Spouse name: Not on file   Number of children: Not on file   Years of education: Not on file   Highest education level: Not on file  Occupational  History   Not on file  Tobacco Use   Smoking status: Never   Smokeless tobacco: Never  Vaping Use   Vaping status: Never Used  Substance and Sexual Activity   Alcohol use: No   Drug use: No   Sexual activity: Yes    Birth control/protection: Coitus interruptus  Other Topics Concern   Not on file  Social History Narrative   ** Merged History Encounter **       Social Drivers of Health   Financial Resource Strain: Not on file  Food Insecurity: Not on file  Transportation Needs: Not on file  Physical Activity: Not on file  Stress: Not on file  Social Connections: Not on file    Allergies: No Known Allergies  Metabolic Disorder Labs: Lab Results  Component Value Date   HGBA1C 5.5 11/11/2022   Lab Results  Component Value Date   PROLACTIN 46.3 (H) 11/11/2022   Lab Results  Component Value Date   CHOL 204 (H) 11/11/2022   TRIG 93 11/11/2022   HDL 38 (L) 11/11/2022   CHOLHDL 5.4 (H) 11/11/2022   VLDL 45 (H) 03/26/2010   LDLCALC 149 (H) 11/11/2022   LDLCALC 156 (H) 03/26/2010   Lab Results  Component Value Date   TSH 2.110 11/11/2022   TSH 4.344 02/23/2008    Therapeutic Level Labs: Lab Results  Component Value Date   LITHIUM 0.62 (L) 09/24/2012   LITHIUM 0.54 (L) 11/22/2009   No results found for: "VALPROATE" No results found for: "CBMZ"  Current Medications: Current Outpatient Medications  Medication Sig Dispense Refill   atorvastatin (LIPITOR) 80 MG tablet Take 80 mg by mouth daily.     benztropine (COGENTIN) 0.5 MG tablet Take 1 tablet (0.5 mg total) by mouth daily. 30 tablet 3   FLUoxetine (PROZAC) 40 MG capsule Take 1 capsule (40 mg total) by mouth daily. 30 capsule 3   haloperidol (HALDOL) 5 MG tablet Take 1 tablet (5 mg total) by mouth at bedtime. 30 tablet 3   HYDROcodone-acetaminophen (NORCO) 5-325 MG tablet Take 1-2 tablets by mouth every 6 (six) hours as needed for moderate pain or severe pain. 20 tablet 0   lithium carbonate 300 MG capsule  Take 2 capsules (600 mg total) by mouth at bedtime. 60 capsule 3   No current facility-administered medications for this visit.     Musculoskeletal: Strength & Muscle Tone: within normal limits and telehealth visit Gait & Station: normal, telehealth visit Patient leans: N/A  Psychiatric Specialty Exam: Review of Systems  There were no vitals taken for this visit.There is no height or weight on file to calculate BMI.  General Appearance: Well Groomed  Eye Contact: good  Speech:  Clear and Coherent and Normal Rate  Volume:  Normal  Mood:  Euthymic  Affect:  Appropriate and Congruent  Thought Process:  Coherent, Goal Directed and Linear  Orientation:  Full (Time, Place, and Person)  Thought Content: Logical and Paranoid Ideation   Suicidal Thoughts:  No  Homicidal Thoughts:  No  Memory:  Immediate;   Good Recent;   Good Remote;   Good  Judgement:  Good  Insight:  Good  Psychomotor Activity:  Normal  Concentration:  Concentration: Good and Attention Span: Good  Recall:  Good  Fund of Knowledge: Good  Language: Good  Akathisia:  No  Handed:  Right  AIMS (if indicated): Not done,   Assets:  Communication Skills Desire for Improvement Financial Resources/Insurance Housing Intimacy Social Support  ADL's:  Intact  Cognition: WNL  Sleep:  Good   Screenings: AIMS    Flowsheet Row Video Visit from 03/19/2021 in [facility]  AIMS Total Score 0      GAD-7    Flowsheet Row Video Visit from 01/28/2023 in [facility] Video Visit from 03/11/2022 in [facility] Video Visit from 11/19/2021 in [facility] Video Visit from 08/19/2021 in [facility] Video Visit from 03/19/2021 in [facility]  Total GAD-7 Score 0 2 3 5 4       PHQ2-9    Flowsheet Row Video Visit from 01/28/2023 in [facility] Video Visit from 11/04/2022 in [facility] Video Visit from 03/11/2022 in [facility] Video Visit from 11/19/2021 in [facility] Video Visit from 08/19/2021 in [facility]  PHQ-2 Total Score 0 0 1 1 2   PHQ-9 Total Score 5 8 8 5 10       Flowsheet Row Video Visit from 11/04/2022 in [facility] Video Visit from 03/11/2022 in [facility] Video Visit from 11/19/2021 in [facility]  C-SSRS RISK CATEGORY Low Risk Low Risk Low Risk        Assessment and Plan: Patient endorses increased paranoia.  She denies [facility] and notes that her anxiety and depression are well-managed.No medication changes made today.  Patient agreeable to continue medication as prescribed.  Provider ordered lithium level. Provider informed patient that neurology can be consulted if forgetfulness does not improve pending lithium level results. She endorsed understanding and agreed. Provider also ordered CBC, CMP, lipid panel, LFT, thyroid panel, and prolactin level.    1. Bipolar I disorder, most recent episode depressed (HCC)  Continue- benztropine (COGENTIN) 0.5 MG tablet; Take 1 tablet (0.5 mg total) by mouth daily.  Dispense: 30 tablet; Refill: 3 Continue- FLUoxetine (PROZAC) 40 MG capsule; Take 1 capsule (40 mg total) by mouth daily.  Dispense: 30 capsule; Refill: 3 Continue- haloperidol (HALDOL) 5 MG tablet; Take 1 tablet (5 mg total) by mouth at bedtime.  Dispense: 30 tablet; Refill: 3 - Lithium level; Future - CBC w/Diff/Platelet - Comprehensive Metabolic Panel (CMET); Future - Hepatic function panel; Future - Thyroid Panel With TSH; Future - Prolactin; Future - Prolactin - Thyroid Panel With TSH - Hepatic function panel - Comprehensive Metabolic Panel (CMET) - Lithium level - Lithium level     Follow-up  for medication management in 2.5 months          Follow up in 3 months Shanna Cisco, NP 04/08/2023, 1:28 PM

## 2023-05-03 ENCOUNTER — Other Ambulatory Visit (HOSPITAL_COMMUNITY): Payer: No Payment, Other

## 2023-05-03 ENCOUNTER — Other Ambulatory Visit (HOSPITAL_COMMUNITY): Payer: Self-pay | Admitting: Psychiatry

## 2023-05-03 DIAGNOSIS — F3130 Bipolar disorder, current episode depressed, mild or moderate severity, unspecified: Secondary | ICD-10-CM

## 2023-06-05 ENCOUNTER — Other Ambulatory Visit (HOSPITAL_COMMUNITY): Payer: Self-pay | Admitting: Psychiatry

## 2023-06-05 DIAGNOSIS — F3130 Bipolar disorder, current episode depressed, mild or moderate severity, unspecified: Secondary | ICD-10-CM

## 2023-07-01 ENCOUNTER — Encounter (HOSPITAL_COMMUNITY): Payer: Self-pay | Admitting: Psychiatry

## 2023-07-01 ENCOUNTER — Ambulatory Visit (INDEPENDENT_AMBULATORY_CARE_PROVIDER_SITE_OTHER): Payer: No Payment, Other | Admitting: Psychiatry

## 2023-07-01 ENCOUNTER — Other Ambulatory Visit: Payer: Self-pay | Admitting: Psychiatry

## 2023-07-01 VITALS — BP 135/77 | HR 64 | Temp 98.1°F | Ht 64.0 in | Wt 168.6 lb

## 2023-07-01 DIAGNOSIS — F3130 Bipolar disorder, current episode depressed, mild or moderate severity, unspecified: Secondary | ICD-10-CM

## 2023-07-01 MED ORDER — HALOPERIDOL 5 MG PO TABS: 5.0000 mg | ORAL_TABLET | Freq: Every day | ORAL | 3 refills | Status: DC

## 2023-07-01 MED ORDER — FLUOXETINE HCL 40 MG PO CAPS: 40.0000 mg | ORAL_CAPSULE | Freq: Every day | ORAL | 3 refills | Status: DC

## 2023-07-01 MED ORDER — BENZTROPINE MESYLATE 0.5 MG PO TABS: 0.5000 mg | ORAL_TABLET | Freq: Every day | ORAL | 3 refills | Status: DC

## 2023-07-01 MED ORDER — LITHIUM CARBONATE 300 MG PO CAPS
600.0000 mg | ORAL_CAPSULE | Freq: Every day | ORAL | 3 refills | Status: DC
Start: 2023-07-01 — End: 2023-09-16

## 2023-07-01 NOTE — Progress Notes (Signed)
 BH MD/PA/NP OP Progress Note       07/01/2023 12:22 PM Vanessa Harrison  MRN:  [id number]  Chief Complaint: "I could not afford the labs"    HPI: 48 year old female seen today for follow up psychiatric evaluation. Provider utilized Spanish speaking interpreter as patient speaks Spanish.  She has a psychiatric history of anxiety and bipolar disorder.  She is currently managed on lithium  600 mg nightly, Haldol  5 mg nightly, Prozac  40 mg daily, and Cogentin  0.5 mg daily. Today she reported that her medications are effective in managing her psychiatric conditions.   Today she was well-groomed, pleasant, cooperative, and engaged in conversation.  She informed Clinical research associate that she was unable to afford her labs.  Patient notes that she was working at [employer] but recently was let go because of her immigration status.  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  She notes that she has been stressing about this and her mother who is in her 90s and ill.  She reports that she would like to visit her mother in [place] but is stated that she fears she will not be able to return to the country that she has been in for the last 20 years.  Patient notes that she has been looking for new positions but it has been difficult finding one.   Since her last visit she informed writer that her husband continues to complain of her forgetfulness and disorganization.  Provider informed patient that her lithium  labs need to be taken to make sure she is not toxic and having adverse effects.  Provider asked patient if she would like to have her labs here as she is unable to afford [facility].  She was agreeable.  Nursing staff and provider drew patient's labs today (Lithium  level, CBC, CMP, lipid panel, LFT, thyroid  panel, Hgb A1c, and prolactin).  If labs come back within normal limits patient may be referred to neurology for further assessment of her memory loss.  Since her  last visit she notes that her anxiety and depression are well managed. Today provider conducted a GAD-7 and patient scored a 5. Provider also conducted a PHQ-9 and patient scored a 5. Today she denies SI/HI/VAH or mania, or paranoia. Patient notes that at times when she is sleepy she feels that someone is watching her. She reports that she is aware that this is not true.  She endorses adequate sleep and appetite.  Today provider conducted an aims assessment and patient scored a 0.  No medication changes made today.  Patient agreeable to continue medication as prescribed.  Labs were conducted today. No other concerns at this time.       Visit Diagnosis:    ICD-10-CM   1. Bipolar I disorder, most recent episode depressed (HCC)  F31.30 CBC w/Diff/Platelet    Comprehensive Metabolic Panel (CMET)    Lithium  level    Prolactin    Hepatic function panel    Thyroid  Panel With TSH    Thyroid  Panel With TSH    Hepatic function panel    Prolactin    Lithium  level    Comprehensive Metabolic Panel (CMET)    CBC w/Diff/Platelet    Lipid Profile    HgB A1c    HgB A1c    Lipid Profile    lithium  carbonate 300 MG capsule    haloperidol  (HALDOL ) 5 MG tablet    FLUoxetine  (PROZAC ) 40 MG capsule  benztropine  (COGENTIN ) 0.5 MG tablet          Past Psychiatric History: Anxiety and bipolar disorder  Past Medical History:  Past Medical History:  Diagnosis Date   Anxiety    Bipolar 2 disorder (HCC)    Discharge from right nipple 11/19/2017   Hypercholesteremia     Past Surgical History:  Procedure Laterality Date   APPENDECTOMY  09/2012   lap appy   BREAST LUMPECTOMY Right 11/19/2017   Procedure: RIGHT BREAST LUMPECTOMY SUBAREOLAR;  Surgeon: Boyce Byes, MD;  Location: [facility] OR;  Service: General;  Laterality: Right;   CESAREAN SECTION     LAPAROSCOPIC APPENDECTOMY N/A 09/24/2012   Procedure: APPENDECTOMY LAPAROSCOPIC;  Surgeon: Diantha Fossa, MD;  Location: [facility] OR;  Service: General;   Laterality: N/A;    Family Psychiatric History: Denies  Family History:  Family History  Problem Relation Age of Onset   Diabetes Mother    Hypertension Mother    Diabetes Sister    Hypertension Sister    Cancer Sister 46       breast   Hypertension Brother     Social History:  Social History   Socioeconomic History   Marital status: Married    Spouse name: Not on file   Number of children: Not on file   Years of education: Not on file   Highest education level: Not on file  Occupational History   Not on file  Tobacco Use   Smoking status: Never   Smokeless tobacco: Never  Vaping Use   Vaping status: Never Used  Substance and Sexual Activity   Alcohol use: No   Drug use: No   Sexual activity: Yes    Birth control/protection: Coitus interruptus  Other Topics Concern   Not on file  Social History Narrative   ** Merged History Encounter **       Social Drivers of Health   Financial Resource Strain: Not on file  Food Insecurity: Not on file  Transportation Needs: Not on file  Physical Activity: Not on file  Stress: Not on file  Social Connections: Not on file    Allergies: No Known Allergies  Metabolic Disorder Labs: Lab Results  Component Value Date   HGBA1C 5.5 11/11/2022   Lab Results  Component Value Date   PROLACTIN 46.3 (H) 11/11/2022   Lab Results  Component Value Date   CHOL 204 (H) 11/11/2022   TRIG 93 11/11/2022   HDL 38 (L) 11/11/2022   CHOLHDL 5.4 (H) 11/11/2022   VLDL 45 (H) 03/26/2010   LDLCALC 149 (H) 11/11/2022   LDLCALC 156 (H) 03/26/2010   Lab Results  Component Value Date   TSH 2.110 11/11/2022   TSH 4.344 02/23/2008    Therapeutic Level Labs: Lab Results  Component Value Date   LITHIUM  0.62 (L) 09/24/2012   LITHIUM  0.54 (L) 11/22/2009   No results found for: "VALPROATE" No results found for: "CBMZ"  Current Medications: Current Outpatient Medications  Medication Sig Dispense Refill   atorvastatin (LIPITOR) 80  MG tablet Take 80 mg by mouth daily.     benztropine  (COGENTIN ) 0.5 MG tablet Take 1 tablet (0.5 mg total) by mouth daily. 30 tablet 3   FLUoxetine  (PROZAC ) 40 MG capsule Take 1 capsule (40 mg total) by mouth daily. 30 capsule 3   haloperidol  (HALDOL ) 5 MG tablet Take 1 tablet (5 mg total) by mouth at bedtime. 30 tablet 3   HYDROcodone -acetaminophen  (NORCO) 5-325 MG tablet Take 1-2 tablets by mouth  every 6 (six) hours as needed for moderate pain or severe pain. 20 tablet 0   lithium  carbonate 300 MG capsule Take 2 capsules (600 mg total) by mouth at bedtime. 60 capsule 3   No current facility-administered medications for this visit.     Musculoskeletal: Strength & Muscle Tone: within normal limits Gait & Station: normal Patient leans: N/A  Psychiatric Specialty Exam: Review of Systems  Blood pressure 135/77, pulse 64, temperature 98.1 F (36.7 C), height 5\' 4"  (1.626 m), weight 168 lb 9.6 oz (76.5 kg), SpO2 98%.Body mass index is 28.94 kg/m.  General Appearance: Well Groomed  Eye Contact: good  Speech:  Clear and Coherent and Normal Rate  Volume:  Normal  Mood:  Euthymic  Affect:  Appropriate and Congruent  Thought Process:  Coherent, Goal Directed and Linear  Orientation:  Full (Time, Place, and Person)  Thought Content: WDL and Logical   Suicidal Thoughts:  No  Homicidal Thoughts:  No  Memory:  Immediate;   Good Recent;   Good Remote;   Good  Judgement:  Good  Insight:  Good  Psychomotor Activity:  Normal  Concentration:  Concentration: Good and Attention Span: Good  Recall:  Good  Fund of Knowledge: Good  Language: Good  Akathisia:  No  Handed:  Right  AIMS (if indicated): Done, 0  Assets:  Communication Skills Desire for Improvement Financial Resources/Insurance Housing Intimacy Social Support  ADL's:  Intact  Cognition: WNL  Sleep:  Good   Screenings: AIMS    Flowsheet Row Clinical Support from 07/01/2023 in [facility] Video  Visit from 03/19/2021 in [facility]  AIMS Total Score 0 0      GAD-7    Flowsheet Row Clinical Support from 07/01/2023 in [facility] Video Visit from 01/28/2023 in [facility] Video Visit from 03/11/2022 in [facility] Video Visit from 11/19/2021 in [facility] Video Visit from 08/19/2021 in [facility]  Total GAD-7 Score 5 0 2 3 5       PHQ2-9    Flowsheet Row Clinical Support from 07/01/2023 in [facility] Video Visit from 01/28/2023 in [facility] Video Visit from 11/04/2022 in [facility] Video Visit from 03/11/2022 in [facility] Video Visit from 11/19/2021 in [facility]  PHQ-2 Total Score 1 0 0 1 1  PHQ-9 Total Score 5 5 8 8 5       Flowsheet Row Video Visit from 11/04/2022 in [facility] Video Visit from 03/11/2022 in [facility] Video Visit from 11/19/2021 in [facility]  C-SSRS RISK CATEGORY Low Risk Low Risk Low Risk        Assessment and Plan: Patient notes that her mood, anxiety, depression, and sleep are well-managed.  She does note that he continues to have forgetfulness and disorganization.  He notes that she has lapses in her memory and reports that her husband is concerned.  Today provider ordered a drew labs (CBC, CMP, lipid panel, LFT, thyroid  panel, and prolactin level).  If labs are not within normal limits patient may be referred to neurology to further assess forgetful and lapses in her memory.  1. Bipolar I disorder, most recent episode depressed (HCC) (Primary)  - CBC w/Diff/Platelet; Future - Comprehensive Metabolic Panel (CMET); Future - Lithium   level; Future -  Prolactin; Future - Hepatic function panel; Future - Thyroid  Panel With TSH; Future - Thyroid  Panel With TSH - Hepatic function panel - Prolactin - Lithium  level - Comprehensive Metabolic Panel (CMET) - CBC w/Diff/Platelet - Lipid Profile; Future - HgB A1c; Future - HgB A1c - Lipid Profile Continue- lithium  carbonate 300 MG capsule; Take 2 capsules (600 mg total) by mouth at bedtime.  Dispense: 60 capsule; Refill: 3 Continue- haloperidol  (HALDOL ) 5 MG tablet; Take 1 tablet (5 mg total) by mouth at bedtime.  Dispense: 30 tablet; Refill: 3 Continue- FLUoxetine  (PROZAC ) 40 MG capsule; Take 1 capsule (40 mg total) by mouth daily.  Dispense: 30 capsule; Refill: 3 Continue- benztropine  (COGENTIN ) 0.5 MG tablet; Take 1 tablet (0.5 mg total) by mouth daily.  Dispense: 30 tablet; Refill: 3 Follow-up for medication management in 2.5 months          Follow up in 3 months Arlyne Bering, NP 07/01/2023, 12:22 PM

## 2023-07-02 LAB — CBC WITH DIFFERENTIAL/PLATELET
Basophils Absolute: 0 10*3/uL (ref 0.0–0.2)
Basos: 0 %
EOS (ABSOLUTE): 0.2 10*3/uL (ref 0.0–0.4)
Eos: 2 %
Hematocrit: 39.3 % (ref 34.0–46.6)
Hemoglobin: 12.5 g/dL (ref 11.1–15.9)
Immature Grans (Abs): 0 10*3/uL (ref 0.0–0.1)
Immature Granulocytes: 0 %
Lymphocytes Absolute: 2.6 10*3/uL (ref 0.7–3.1)
Lymphs: 28 %
MCH: 29.3 pg (ref 26.6–33.0)
MCHC: 31.8 g/dL (ref 31.5–35.7)
MCV: 92 fL (ref 79–97)
Monocytes Absolute: 0.4 10*3/uL (ref 0.1–0.9)
Monocytes: 4 %
Neutrophils Absolute: 6 10*3/uL (ref 1.4–7.0)
Neutrophils: 66 %
Platelets: 295 10*3/uL (ref 150–450)
RBC: 4.26 x10E6/uL (ref 3.77–5.28)
RDW: 13.3 % (ref 11.7–15.4)
WBC: 9.2 10*3/uL (ref 3.4–10.8)

## 2023-07-02 LAB — COMPREHENSIVE METABOLIC PANEL WITH GFR
ALT: 14 IU/L (ref 0–32)
AST: 12 IU/L (ref 0–40)
Albumin: 4.4 g/dL (ref 3.9–4.9)
Alkaline Phosphatase: 140 IU/L — ABNORMAL HIGH (ref 44–121)
BUN/Creatinine Ratio: 13 (ref 9–23)
BUN: 9 mg/dL (ref 6–24)
Bilirubin Total: 0.3 mg/dL (ref 0.0–1.2)
CO2: 20 mmol/L (ref 20–29)
Calcium: 10 mg/dL (ref 8.7–10.2)
Chloride: 106 mmol/L (ref 96–106)
Creatinine, Ser: 0.71 mg/dL (ref 0.57–1.00)
Globulin, Total: 3.1 g/dL (ref 1.5–4.5)
Glucose: 87 mg/dL (ref 70–99)
Potassium: 4.5 mmol/L (ref 3.5–5.2)
Sodium: 142 mmol/L (ref 134–144)
Total Protein: 7.5 g/dL (ref 6.0–8.5)
eGFR: 104 mL/min/{1.73_m2} (ref >59)

## 2023-07-02 LAB — HEMOGLOBIN A1C
Est. average glucose Bld gHb Est-mCnc: 108 mg/dL
Hgb A1c MFr Bld: 5.4 % (ref 4.8–5.6)

## 2023-07-02 LAB — SPECIMEN STATUS REPORT

## 2023-07-02 LAB — LIPID PANEL
Chol/HDL Ratio: 5.7 ratio — ABNORMAL HIGH (ref 0.0–4.4)
Cholesterol, Total: 221 mg/dL — ABNORMAL HIGH (ref 100–199)
HDL: 39 mg/dL — ABNORMAL LOW (ref >39)
LDL Chol Calc (NIH): 159 mg/dL — ABNORMAL HIGH (ref 0–99)
Triglycerides: 124 mg/dL (ref 0–149)
VLDL Cholesterol Cal: 23 mg/dL (ref 5–40)

## 2023-07-02 LAB — LITHIUM LEVEL: Lithium Lvl: 0.4 mmol/L — ABNORMAL LOW (ref 0.5–1.2)

## 2023-07-02 LAB — HEPATIC FUNCTION PANEL: Bilirubin, Direct: 0.1 mg/dL (ref 0.00–0.40)

## 2023-07-02 LAB — THYROID PANEL WITH TSH
Free Thyroxine Index: 2.3 (ref 1.2–4.9)
T3 Uptake Ratio: 24 % (ref 24–39)
T4, Total: 9.4 ug/dL (ref 4.5–12.0)
TSH: 2.92 u[IU]/mL (ref 0.450–4.500)

## 2023-07-02 LAB — PROLACTIN: Prolactin: 61.4 ng/mL — ABNORMAL HIGH (ref 4.8–33.4)

## 2023-07-12 ENCOUNTER — Ambulatory Visit (HOSPITAL_COMMUNITY): Payer: Self-pay | Admitting: Psychiatry

## 2023-07-12 NOTE — Progress Notes (Signed)
 Provider dicussed patients recent labs. Provider infromed patient that elevated prolactin levels could be the cause of her forgetfulness. Provider informed patient that the elevation could be related to her forgetfulness. Provider informed patient that she could be referred to endicrinology to rule out a pituitary adenoma and to assess her forgetfulness. She notes that she is without insurance as she recently lost her job and would like to wait on a referral to endocrinology. She endorsed understanding and agreed. Provider also discussed elevated cholesterol and encouraged lean meat, vegetable, fruits, and diet. She notes that she would try. Patients lithium  level is slightly reduced but she does note that she has been taking her medication as prescribed. No other concerns noted at this time.

## 2023-09-15 NOTE — Progress Notes (Signed)
 BH MD Outpatient Progress Note  09/16/2023 4:30 PM Vanessa Harrison  MRN:  [id number]  Assessment:  Henny Strauch presents for follow-up evaluation in-person on 09/16/23. Interpreter was present in-person.  Spanish-speaking female patient with established bipolar I disorder, accompanied by her husband for today's visit. She reports persistent fatigue and memory issues, previously explored in May with labs notable for elevated prolactin. Referral to endocrinology was placed today. Attempted to briefly clarify diagnosis of bipolar I disorder; patient endorsed past psychotic symptoms including paranoia and delusions, though these occurred many years ago when her now-adult son was an infant. She reduced her dose of Haldol  (2.5 mg instead of prescribed 5 mg), with no resurgence of psychotic symptoms or concerns for mania. We discussed the potential to discontinue Haldol  in the future, particularly if metabolic concerns (dyslipidemia) persist. Low suspicion that Haldol  is contributing significantly to her prolactinemia, but we will continue to monitor. All other medications were continued.  The patient appeared anxious and relied heavily on her husband to provide collateral information. Plan to interview her independently at a future visit to better assess her insight and cognition. Per husband's report, there is a history of IDD diagnosed when she was seen at [facility], will attempt to obtain records of this as there is no evidence on chart review.   Chart Review Recent Labs/Imaging since last visit:  Abnormal lipid panel showing elevation of Chol 221, LDL 159,  LFTs WNL Thyroid  function A1C - 5.4% WNl  Identifying Information: Vanessa Harrison is a 48 y.o. female with a history of bipolar I disorder and anxiety who is an established patient with [facility] for management of medications and mood. Initial evaluation by Harl Zane BRAVO, NP completed on  10/18/2019. For a comprehensive history and detailed assessment, please refer to the initial adult assessment.   Plan:  # Bipolar I disorder, most recent episode depressed  Past medication trials:  Status of problem: new to me Interventions: -- Continue Lithium  600 mg nightly  --Li level subtherapeutic 0.4 on 07/01/2023 -adherence encouraged and psycho education provided  --TSH and RFTs are WNL, last 07/01/2023 -- Continue Prozac  40 mg daily -- Continue Haldol  2.5 mg daily  --consider discontinuing at next visit -- Continue Cogentin  0.5 mg daily  --consider discontinuing at next visit  #Elevated prolactin - Endocrinology referral sent on 09/16/23    Patient was given contact information for behavioral health clinic and was instructed to call 911 for emergencies.   Subjective:  Chief Complaint:  Chief Complaint  Patient presents with   Memory Loss   Medication Refill    Interval History:  The patient reports increasing forgetfulness and concerns about worsening attention, though she denies symptoms of depression. Husband reports the patient was diagnosed with IDD when she previously followed with [facility]. She describes some mild restlessness but otherwise feels stable. Her husband assists her with managing her medications. He mentioned that the patient may occasionally miss doses of her lithium , and he will continue helping her remain adherent. Both confirm she has a primary care provider, though they are unable to recall the provider's name.  She reports good sleep overall, though she wakes 2-3 times nightly due to urinary frequency. Appetite is adequate, she typically has breakfast and dinner and snacks throughout the day. She denies caffeine or recent substance use. No suicidal ideation or adverse effects to medications are reported.  Visit Diagnosis:    ICD-10-CM   1. Bipolar I disorder, most recent episode depressed (HCC)  F31.30  Ambulatory referral to Endocrinology     lithium  carbonate 300 MG capsule    haloperidol  (HALDOL ) 5 MG tablet    FLUoxetine  (PROZAC ) 40 MG capsule    benztropine  (COGENTIN ) 0.5 MG tablet     Past Psychiatric History:  Diagnoses: Anxiety and bipolar disorder  No prior psychiatric admissions  Social History: Lives in [place] with her mothe Children: one adult son, he is married Marital status: married Firearms: Denies Unemployed    Social History   Socioeconomic History   Marital status: Married    Spouse name: Not on file   Number of children: Not on file   Years of education: Not on file   Highest education level: Not on file  Occupational History   Not on file  Tobacco Use   Smoking status: Never   Smokeless tobacco: Never  Vaping Use   Vaping status: Never Used  Substance and Sexual Activity   Alcohol use: No   Drug use: No   Sexual activity: Yes    Birth control/protection: Coitus interruptus  Other Topics Concern   Not on file  Social History Narrative   ** Merged History Encounter **       Social Drivers of Corporate investment banker Strain: Not on file  Food Insecurity: Not on file  Transportation Needs: Not on file  Physical Activity: Not on file  Stress: Not on file  Social Connections: Not on file    Allergies: No Known Allergies  Current Medications: Current Outpatient Medications  Medication Sig Dispense Refill   atorvastatin (LIPITOR) 80 MG tablet Take 80 mg by mouth daily.     benztropine  (COGENTIN ) 0.5 MG tablet Take 1 tablet (0.5 mg total) by mouth daily. 30 tablet 2   FLUoxetine  (PROZAC ) 40 MG capsule Take 1 capsule (40 mg total) by mouth daily. 30 capsule 3   haloperidol  (HALDOL ) 5 MG tablet Take 0.5 tablets (2.5 mg total) by mouth at bedtime. 30 tablet 2   lithium  carbonate 300 MG capsule Take 2 capsules (600 mg total) by mouth at bedtime. 60 capsule 3   No current facility-administered medications for this visit.    ROS: Review of Systems  All other systems  reviewed and are negative.   Objective:  Objective: Psychiatric Specialty Exam: General Appearance: Casual, fairly groomed  Eye Contact:  fleeting    Speech:  Clear, coherent, normal rate, spontaneous  Volume:  Normal   Mood:  see above  Affect:  anxious  Thought Content: Logical, concrete at times  Suicidal Thoughts: see subjective  Thought Process:  Coherent, goal-directed  Orientation:  A&Ox4   Memory:  Immediate good  Judgment:  Fair   Insight:  Limited  Concentration:  Attention and concentration good   Recall:  [employer] of Knowledge: Fair  Language: Fair fluency   Psychomotor Activity: Normal  Akathisia:  NA   AIMS (if indicated): NA   Assets:   Communication Skills Desire for Improvement Housing Social Support  ADL's:  Intact  Cognition: WNL  Sleep: see above  Appetite: see above    Physical Exam Vitals reviewed.  Constitutional:      General: She is not in acute distress.    Appearance: She is not ill-appearing.  HENT:     Head: Normocephalic and atraumatic.  Pulmonary:     Effort: Pulmonary effort is normal. No respiratory distress.  Musculoskeletal:        General: Normal range of motion.  Skin:    General: Skin  is warm and dry.  Neurological:     General: No focal deficit present.      Metabolic Disorder Labs: Lab Results  Component Value Date   HGBA1C 5.4 07/01/2023   Lab Results  Component Value Date   PROLACTIN 61.4 (H) 07/01/2023   PROLACTIN 46.3 (H) 11/11/2022   Lab Results  Component Value Date   CHOL 221 (H) 07/01/2023   TRIG 124 07/01/2023   HDL 39 (L) 07/01/2023   CHOLHDL 5.7 (H) 07/01/2023   VLDL 45 (H) 03/26/2010   LDLCALC 159 (H) 07/01/2023   LDLCALC 149 (H) 11/11/2022   Lab Results  Component Value Date   TSH 2.920 07/01/2023   TSH 2.110 11/11/2022    Therapeutic Level Labs: Lab Results  Component Value Date   LITHIUM  0.4 (L) 07/01/2023   LITHIUM  0.62 (L) 09/24/2012   No results found for: VALPROATE No  results found for: CBMZ  Screenings:  AIMS    Flowsheet Row Clinical Support from 07/01/2023 in [facility] Video Visit from 03/19/2021 in [facility]  AIMS Total Score 0 0   GAD-7    Flowsheet Row Clinical Support from 07/01/2023 in [facility] Video Visit from 01/28/2023 in [facility] Video Visit from 03/11/2022 in [facility] Video Visit from 11/19/2021 in [facility] Video Visit from 08/19/2021 in [facility]  Total GAD-7 Score 5 0 2 3 5    PHQ2-9    Flowsheet Row Clinical Support from 09/16/2023 in [facility] Clinical Support from 07/01/2023 in [facility] Video Visit from 01/28/2023 in [facility] Video Visit from 11/04/2022 in [facility] Video Visit from 03/11/2022 in [facility]  PHQ-2 Total Score 0 1 0 0 1  PHQ-9 Total Score -- 5 5 8 8    Flowsheet Row Video Visit from 11/04/2022 in [facility] Video Visit from 03/11/2022 in [facility] Video Visit from 11/19/2021 in [facility]  C-SSRS RISK CATEGORY Low Risk Low Risk Low Risk    Collaboration of Care:   Patient/Guardian was advised Release of Information must be obtained prior to any record release in order to collaborate their care with an outside provider. Patient/Guardian was advised if they have not already done so to contact the registration department to sign all necessary forms in order for us  to release information regarding their care.   Consent: Patient/Guardian gives verbal consent for treatment and assignment of benefits for services provided during this visit. Patient/Guardian expressed understanding  and agreed to proceed.    Marlo Masson, MD 09/16/2023, 4:30 PM

## 2023-09-16 ENCOUNTER — Ambulatory Visit (HOSPITAL_COMMUNITY): Admitting: Student in an Organized Health Care Education/Training Program

## 2023-09-16 ENCOUNTER — Encounter (HOSPITAL_COMMUNITY): Payer: Self-pay | Admitting: Student in an Organized Health Care Education/Training Program

## 2023-09-16 DIAGNOSIS — F3130 Bipolar disorder, current episode depressed, mild or moderate severity, unspecified: Secondary | ICD-10-CM | POA: Diagnosis not present

## 2023-09-16 MED ORDER — HALOPERIDOL 5 MG PO TABS: 2.5000 mg | ORAL_TABLET | Freq: Every day | ORAL | 2 refills | Status: DC

## 2023-09-16 MED ORDER — FLUOXETINE HCL 40 MG PO CAPS: 40.0000 mg | ORAL_CAPSULE | Freq: Every day | ORAL | 3 refills | Status: DC

## 2023-09-16 MED ORDER — LITHIUM CARBONATE 300 MG PO CAPS: 600.0000 mg | ORAL_CAPSULE | Freq: Every day | ORAL | 3 refills | Status: DC

## 2023-09-16 MED ORDER — BENZTROPINE MESYLATE 0.5 MG PO TABS: 0.5000 mg | ORAL_TABLET | Freq: Every day | ORAL | 2 refills | Status: DC

## 2023-09-16 NOTE — Patient Instructions (Addendum)
 Nota adicional: Se envi una interconsulta a endocrinologa para evaluar la causa del nivel elevado de prolactina. Le recomendamos dar seguimiento con esa cita.  Si tiene preguntas, puede comunicarse con nuestro equipo.   Instrucciones para Nurse, mental health a Travs de la Alimentacin  Estimado/a paciente:  Con el objetivo de mejorar su salud cardiovascular y reducir sus niveles de colesterol, le recomendamos los siguientes cambios en su dieta:  1. Reduzca el consumo de grasas saturadas Evite carnes rojas con alto contenido graso, embutidos, [place], crema, quesos enteros y productos lcteos no descremados.  Prefiera carnes magras (pollo sin piel, pavo, pescado) y lcteos bajos en grasa (leche [place], yogur bajo en grasa).  2. Evite las grasas trans Lea las etiquetas y evite productos que contengan "aceites parcialmente hidrogenados".  Limite el consumo de productos horneados industriales como galletas, pasteles y frituras comerciales.  3. Aumente el consumo de fibra Consuma ms frutas, vegetales, legumbres (habichuelas, lentejas, garbanzos) y granos enteros (avena, arroz integral, pan integral).  La fibra ayuda a disminuir el colesterol LDL ("malo").  4. Consuma grasas saludables con moderacin Prefiera el aceite de oliva, aguacate, nueces y semillas.  Consuma pescado graso (salmn, atn, sardinas) al menos 2 veces por semana por su contenido de omega-3.  5. Limite el consumo de azcar y harinas refinadas Reduzca el consumo de dulces, refrescos, jugos procesados, pan blanco y arroz blanco.  Prefiera versiones integrales.  6. Modere el consumo de alcohol Si consume alcohol, hgalo con moderacin: mximo 1 bebida al da para mujeres y 2 para hombres.  7. Hidratacin y ejercicio Tome suficiente agua diariamente.  Combine estos cambios con al menos 150 minutos semanales de actividad fsica moderada (como caminar rpido).

## 2023-09-17 NOTE — Addendum Note (Signed)
 Addended by: CARVIN CROCK on: 09/17/2023 11:32 AM   Modules accepted: Level of Service

## 2023-11-04 ENCOUNTER — Encounter (HOSPITAL_COMMUNITY): Payer: Self-pay | Admitting: Student in an Organized Health Care Education/Training Program

## 2023-11-04 ENCOUNTER — Ambulatory Visit (INDEPENDENT_AMBULATORY_CARE_PROVIDER_SITE_OTHER): Admitting: Student in an Organized Health Care Education/Training Program

## 2023-11-04 VITALS — BP 138/74 | HR 78 | Wt 177.0 lb

## 2023-11-04 DIAGNOSIS — F3130 Bipolar disorder, current episode depressed, mild or moderate severity, unspecified: Secondary | ICD-10-CM

## 2023-11-04 NOTE — Progress Notes (Signed)
 BH MD Outpatient Progress Note  11/04/2023 4:40 PM Vanessa Harrison  MRN:  [id number]  Assessment:  Vanessa Harrison presents for follow-up evaluation in-person on 11/04/23. Interpreter was present in-person.  At today's appointment, the patient continues to demonstrate prolonged stability on her current psychotropic regimen. Since the last visit, there have been no reported mood episodes, and depressive symptoms remain well controlled with fluoxetine . Lithium  continues to provide effective mood stabilization, and there is no evidence of current or recent mania. The patient maintains good psychosocial support and demonstrates insight into her illness. The patient expressed hesitancy regarding medication changes at this time. However, ongoing discussions will focus on the potential discontinuation of haloperidol  and benztropine .   Identifying Information: Vanessa Harrison is a 48 y.o. female, who is spanish speaking,with a history of bipolar I disorder and anxiety who is an established patient with [facility] for management of medications and mood. Initial evaluation by Vanessa Zane BRAVO, NP completed on 10/18/2019. For a comprehensive history and detailed assessment, please refer to the initial adult assessment.   Plan:  # Bipolar I disorder, most recent episode depressed  Past medication trials:  Status of problem: new to me Interventions: -- Continue Lithium  600 mg nightly  --Li level subtherapeutic 0.4 on 07/01/2023 -adherence encouraged and psycho education provided  --TSH and RFTs are WNL, last 07/01/2023 -- Continue Prozac  40 mg daily -- Continue Haldol  2.5 mg daily  --continue discussions to discontinue -- Continue Cogentin  0.5 mg daily  --continue discussions to discontinue  #Elevated prolactin - Endocrinology referral sent on 11/04/23    Patient was given contact information for behavioral health clinic and was instructed to call  911 for emergencies.   Subjective:  Chief Complaint:  Chief Complaint  Patient presents with   Follow-up    Interval History:  Patient reports intermittent headaches and acknowledges suboptimal hydration. She describes improved energy and increased motivation to complete household tasks. She recalls her last significant episode occurred approximately 5 to 7 years ago, during which she felt markedly different from her usual self. She is compliant with her medications, though she notes possible gastrointestinal side effects, including GERD, which may be exacerbated by her consumption of chiles. She reports sleeps on average 10 to 12 hours nightly. Appetite is notable for increased cravings for sweets. She denies recent substance use and denies suicidal ideation. Caffeine intake was not specified.  Visit Diagnosis:  No diagnosis found.  Past Psychiatric History:  Diagnoses: Anxiety and bipolar disorder  No prior psychiatric admissions  Social History: Lives [place] with her mothe Children: one adult son, he is married Marital status: married Firearms: Denies Unemployed    Social History   Socioeconomic History   Marital status: Married    Spouse name: Not on file   Number of children: Not on file   Years of education: Not on file   Highest education level: Not on file  Occupational History   Not on file  Tobacco Use   Smoking status: Never   Smokeless tobacco: Never  Vaping Use   Vaping status: Never Used  Substance and Sexual Activity   Alcohol use: No   Drug use: No   Sexual activity: Yes    Birth control/protection: Coitus interruptus  Other Topics Concern   Not on file  Social History Narrative   ** Merged History Encounter **       Social Drivers of Corporate investment banker Strain: Not on file  Food Insecurity: Not on  file  Transportation Needs: Not on file  Physical Activity: Not on file  Stress: Not on file  Social Connections: Not on file     Allergies: No Known Allergies  Current Medications: Current Outpatient Medications  Medication Sig Dispense Refill   atorvastatin (LIPITOR) 80 MG tablet Take 80 mg by mouth daily.     benztropine  (COGENTIN ) 0.5 MG tablet Take 1 tablet (0.5 mg total) by mouth daily. 30 tablet 2   FLUoxetine  (PROZAC ) 40 MG capsule Take 1 capsule (40 mg total) by mouth daily. 30 capsule 3   haloperidol  (HALDOL ) 5 MG tablet Take 0.5 tablets (2.5 mg total) by mouth at bedtime. 30 tablet 2   lithium  carbonate 300 MG capsule Take 2 capsules (600 mg total) by mouth at bedtime. 60 capsule 3   No current facility-administered medications for this visit.    ROS: Review of Systems  All other systems reviewed and are negative.   Objective:  Objective: Psychiatric Specialty Exam: General Appearance: Casual, fairly groomed  Eye Contact:  fleeting    Speech:  Clear, coherent, normal rate, spontaneous  Volume:  Normal   Mood:  see above  Affect:  anxious  Thought Content: Logical, concrete at times  Suicidal Thoughts: see subjective  Thought Process:  Coherent, goal-directed  Orientation:  A&Ox4   Memory:  Immediate good  Judgment:  Fair   Insight:  Limited  Concentration:  Attention and concentration good   Recall:  [employer] of Knowledge: Fair  Language: Fair fluency   Psychomotor Activity: Normal  Akathisia:  NA   AIMS (if indicated): NA   Assets:   Communication Skills Desire for Improvement Housing Social Support  ADL's:  Intact  Cognition: WNL  Sleep: see above  Appetite: see above    Physical Exam Vitals reviewed.  Constitutional:      General: She is not in acute distress.    Appearance: She is not ill-appearing.  HENT:     Head: Normocephalic and atraumatic.  Pulmonary:     Effort: Pulmonary effort is normal. No respiratory distress.  Musculoskeletal:        General: Normal range of motion.  Skin:    General: Skin is warm and dry.  Neurological:     General: No focal  deficit present.      Metabolic Disorder Labs: Lab Results  Component Value Date   HGBA1C 5.4 07/01/2023   Lab Results  Component Value Date   PROLACTIN 61.4 (H) 07/01/2023   PROLACTIN 46.3 (H) 11/11/2022   Lab Results  Component Value Date   CHOL 221 (H) 07/01/2023   TRIG 124 07/01/2023   HDL 39 (L) 07/01/2023   CHOLHDL 5.7 (H) 07/01/2023   VLDL 45 (H) 03/26/2010   LDLCALC 159 (H) 07/01/2023   LDLCALC 149 (H) 11/11/2022   Lab Results  Component Value Date   TSH 2.920 07/01/2023   TSH 2.110 11/11/2022    Therapeutic Level Labs: Lab Results  Component Value Date   LITHIUM  0.4 (L) 07/01/2023   LITHIUM  0.62 (L) 09/24/2012   No results found for: VALPROATE No results found for: CBMZ  Screenings:  AIMS    Flowsheet Row Clinical Support from 07/01/2023 in [facility] Video Visit from 03/19/2021 in [facility]  AIMS Total Score 0 0   GAD-7    Flowsheet Row Clinical Support from 07/01/2023 in [facility] Video Visit from 01/28/2023 in [facility] Video Visit  from 03/11/2022 in [facility] Video Visit from 11/19/2021 in [facility] Video Visit from 08/19/2021 in [facility]  Total GAD-7 Score 5 0 2 3 5    PHQ2-9    Flowsheet Row Clinical Support from 09/16/2023 in [facility] Clinical Support from 07/01/2023 in [facility] Video Visit from 01/28/2023 in [facility] Video Visit from 11/04/2022 in [facility] Video Visit from 03/11/2022 in [facility]  PHQ-2 Total Score 0 1 0 0 1  PHQ-9 Total Score -- 5 5 8 8    Flowsheet Row Video Visit from 11/04/2022 in [facility] Video Visit from 03/11/2022 in [facility] Video Visit from 11/19/2021 in [facility]  C-SSRS RISK CATEGORY Low Risk Low Risk Low Risk    Collaboration of Care:   Patient/Guardian was advised Release of Information must be obtained prior to any record release in order to collaborate their care with an outside provider. Patient/Guardian was advised if they have not already done so to contact the registration department to sign all necessary forms in order for us  to release information regarding their care.   Consent: Patient/Guardian gives verbal consent for treatment and assignment of benefits for services provided during this visit. Patient/Guardian expressed understanding and agreed to proceed.    Marlo Masson, MD 11/04/2023, 4:40 PM

## 2023-12-23 ENCOUNTER — Ambulatory Visit (INDEPENDENT_AMBULATORY_CARE_PROVIDER_SITE_OTHER): Admitting: Student in an Organized Health Care Education/Training Program

## 2023-12-23 DIAGNOSIS — F3130 Bipolar disorder, current episode depressed, mild or moderate severity, unspecified: Secondary | ICD-10-CM

## 2023-12-23 MED ORDER — LITHIUM CARBONATE 300 MG PO CAPS: 600.0000 mg | ORAL_CAPSULE | Freq: Every day | ORAL | 3 refills | Status: DC

## 2023-12-23 MED ORDER — FLUOXETINE HCL 40 MG PO CAPS: 40.0000 mg | ORAL_CAPSULE | Freq: Every day | ORAL | 0 refills | Status: DC

## 2023-12-23 MED ORDER — FLUOXETINE HCL 40 MG PO CAPS
40.0000 mg | ORAL_CAPSULE | Freq: Every day | ORAL | 0 refills | Status: DC
Start: 2023-12-23 — End: 2023-12-23

## 2023-12-23 NOTE — Patient Instructions (Signed)
 Resumen de la Visita  Hoy hemos decidido suspender los medicamentos Haldol  y Cogentin , ya que usted se encuentra estable y ha [place]. Es muy importante que, si vuelven a associate professor o sntomas de mana, nos llame de inmediato para poder ayudarle.  Por favor, contine tomando los siguientes medicamentos segn lo indicado: Prozac  40 mg una vez al da Litio 600 mg por la noche  Tambin le recomendamos que siga mantenindose bien hidratada todos [place].  Si tiene alguna pregunta o nota algn cambio en su estado de nimo o sntomas, no dude en comunicarse con nosotros.

## 2023-12-23 NOTE — Progress Notes (Signed)
 BH MD Outpatient Progress Note  12/23/2023 6:06 PM Vanessa Harrison  MRN:  [id number]  Assessment:  Vanessa Harrison presents for follow-up evaluation in-person on 12/23/23. Interpreter was present in-person.  The patient is stable on her current psychotropic regimen. She has tolerated the gradual taper of Haldol  without any emergence or worsening of psychiatric symptoms. She reports no depressive or anxiety symptoms at this visit. She is currently taking Haldol  2.5 mg and is agreeable to discontinuing both Haldol  and Cogentin . Lithium  and Prozac  will be continued at current doses.  Identifying Information: Vanessa Harrison is a 48 y.o. female, who is spanish speaking,with a history of bipolar I disorder and anxiety who is an established patient with [facility] for management of medications and mood. Initial evaluation by Harl Zane BRAVO, NP completed on 10/18/2019. For a comprehensive history and detailed assessment, please refer to the initial adult assessment.   Plan:  # Bipolar I disorder, most recent episode depressed approaching remission Past medication trials:  Status of problem: stabilizing Interventions: -- Continue Lithium  600 mg nightly  --Li level subtherapeutic 0.4 on 07/01/2023   --TSH and RFTs are WNL, last 07/01/2023 -- Continue Prozac  40 mg daily -- Stop haldol  and cogentin , patient tolerated taper  Patient was given contact information for behavioral health clinic and was instructed to call 911 for emergencies.   Subjective:  Chief Complaint:  Chief Complaint  Patient presents with   Follow-up    Interval History:  Patient reports doing really well overall. She denies symptoms of depression and anxiety. She denies any adverse effects to her medications and states she is compliant, taking them as directed without missing doses. She reports sleeping very well and describes her appetite as fair, eating two to three  times daily. She denies recent substance use. She denies suicidal or homicidal ideation. She sometimes sees things in the evenings when she is in her bedroom, but this does not occur during the day.    Visit Diagnosis:    ICD-10-CM   1. Bipolar I disorder, most recent episode depressed (HCC)  F31.30 FLUoxetine  (PROZAC ) 40 MG capsule    lithium  carbonate 300 MG capsule    DISCONTINUED: FLUoxetine  (PROZAC ) 40 MG capsule    DISCONTINUED: lithium  carbonate 300 MG capsule      Past Psychiatric History:  Diagnoses: Anxiety and bipolar disorder  No prior psychiatric admissions  Social History: Lives in [place] with her husband Children: one adult son Marital status: married Firearms: Denies Unemployed    Social History   Socioeconomic History   Marital status: Married    Spouse name: Not on file   Number of children: Not on file   Years of education: Not on file   Highest education level: Not on file  Occupational History   Not on file  Tobacco Use   Smoking status: Never   Smokeless tobacco: Never  Vaping Use   Vaping status: Never Used  Substance and Sexual Activity   Alcohol use: No   Drug use: No   Sexual activity: Yes    Birth control/protection: Coitus interruptus  Other Topics Concern   Not on file  Social History Narrative   ** Merged History Encounter **       Social Drivers of Corporate Investment Banker Strain: Not on file  Food Insecurity: Not on file  Transportation Needs: Not on file  Physical Activity: Not on file  Stress: Not on file  Social Connections: Not on file  Allergies: No Known Allergies  Current Medications: Current Outpatient Medications  Medication Sig Dispense Refill   atorvastatin (LIPITOR) 80 MG tablet Take 80 mg by mouth daily.     [START ON 01/07/2024] FLUoxetine  (PROZAC ) 40 MG capsule Take 1 capsule (40 mg total) by mouth daily. 90 capsule 0   [START ON 01/07/2024] lithium  carbonate 300 MG capsule Take 2 capsules (600  mg total) by mouth at bedtime. 60 capsule 3   No current facility-administered medications for this visit.    ROS: Review of Systems  All other systems reviewed and are negative.   Objective:  Objective: Psychiatric Specialty Exam: General Appearance: Casual, fairly groomed  Eye Contact:  fleeting    Speech:  Clear, coherent, normal rate, spontaneous  Volume:  Normal   Mood:  see above  Affect:  congruent, full range  Thought Content: Logical, concrete at times  Suicidal Thoughts: see subjective  Thought Process:  Coherent, goal-directed  Orientation:  A&Ox4   Memory:  Immediate good  Judgment:  Fair   Insight:  Fair  Concentration:  Attention and concentration good   Recall:  [employer] of Knowledge: Fair  Language: Fair fluency   Psychomotor Activity: Normal  Akathisia:  NA   AIMS (if indicated): NA   Assets:   Communication Skills Desire for Improvement Housing Social Support  ADL's:  Intact  Cognition: WNL  Sleep: see above  Appetite: see above    Physical Exam Vitals reviewed.  Constitutional:      General: She is not in acute distress.    Appearance: She is not ill-appearing.  HENT:     Head: Normocephalic and atraumatic.  Pulmonary:     Effort: Pulmonary effort is normal. No respiratory distress.  Musculoskeletal:        General: Normal range of motion.  Skin:    General: Skin is warm and dry.  Neurological:     General: No focal deficit present.      Metabolic Disorder Labs: Lab Results  Component Value Date   HGBA1C 5.4 07/01/2023   Lab Results  Component Value Date   PROLACTIN 61.4 (H) 07/01/2023   PROLACTIN 46.3 (H) 11/11/2022   Lab Results  Component Value Date   CHOL 221 (H) 07/01/2023   TRIG 124 07/01/2023   HDL 39 (L) 07/01/2023   CHOLHDL 5.7 (H) 07/01/2023   VLDL 45 (H) 03/26/2010   LDLCALC 159 (H) 07/01/2023   LDLCALC 149 (H) 11/11/2022   Lab Results  Component Value Date   TSH 2.920 07/01/2023   TSH 2.110  11/11/2022    Therapeutic Level Labs: Lab Results  Component Value Date   LITHIUM  0.4 (L) 07/01/2023   LITHIUM  0.62 (L) 09/24/2012   No results found for: VALPROATE No results found for: CBMZ  Screenings:  AIMS    Flowsheet Row Clinical Support from 07/01/2023 in [facility] Video Visit from 03/19/2021 in [facility]  AIMS Total Score 0 0   GAD-7    Flowsheet Row Clinical Support from 07/01/2023 in [facility] Video Visit from 01/28/2023 in [facility] Video Visit from 03/11/2022 in [facility] Video Visit from 11/19/2021 in [facility] Video Visit from 08/19/2021 in [facility]  Total GAD-7 Score 5 0 2 3 5    PHQ2-9    Flowsheet Row Clinical Support from 09/16/2023 in [facility] Clinical Support from  07/01/2023 in [facility] Video Visit from 01/28/2023 in [facility] Video Visit from 11/04/2022 in [facility] Video Visit from 03/11/2022 in [facility]  PHQ-2 Total Score 0 1 0 0 1  PHQ-9 Total Score -- 5 5 8 8    Flowsheet Row Video Visit from 11/04/2022 in [facility] Video Visit from 03/11/2022 in [place] Video Visit from 11/19/2021 in [facility]  C-SSRS RISK CATEGORY Low Risk Low Risk Low Risk    Collaboration of Care:   Patient/Guardian was advised Release of Information must be obtained prior to any record release in order to collaborate their care with an outside provider. Patient/Guardian was advised if they have not already done so to contact the registration department to sign all necessary forms in order for us  to release information regarding their  care.   Consent: Patient/Guardian gives verbal consent for treatment and assignment of benefits for services provided during this visit. Patient/Guardian expressed understanding and agreed to proceed.    Marlo Masson, MD 12/23/2023, 6:06 PM

## 2024-02-21 ENCOUNTER — Telehealth (HOSPITAL_COMMUNITY): Payer: Self-pay | Admitting: Student in an Organized Health Care Education/Training Program

## 2024-02-21 DIAGNOSIS — F3130 Bipolar disorder, current episode depressed, mild or moderate severity, unspecified: Secondary | ICD-10-CM

## 2024-02-21 DIAGNOSIS — G4700 Insomnia, unspecified: Secondary | ICD-10-CM

## 2024-02-21 MED ORDER — BENZTROPINE MESYLATE 1 MG PO TABS: 0.5000 mg | ORAL_TABLET | Freq: Every day | ORAL | 0 refills | Status: DC

## 2024-02-21 MED ORDER — HYDROXYZINE HCL 25 MG PO TABS: 25.0000 mg | ORAL_TABLET | Freq: Every evening | ORAL | 0 refills | Status: DC | PRN

## 2024-02-21 MED ORDER — RISPERIDONE 2 MG PO TABS: 2.0000 mg | ORAL_TABLET | Freq: Every day | ORAL | 0 refills | Status: DC

## 2024-02-21 NOTE — Telephone Encounter (Signed)
 Contacted and spoke with the patient, who reports that she has not been feeling like herself lately.  She is unsure if this coincided with the discontinuation of Haldol , but reports worsening insomnia.  She does not report suicidal ideations or homicidal ideations, but describes feeling like she is entering a crisis.  Her husband is present for the call, who reports the patient has become more hyperverbal since the Haldol  was discontinued.  He notes she has become more restless and more easily irritable.  Safety planning completed with both patient and husband, they were advised to present to the [facility] should patient continue to decompensate or if there are safety concerns.    The patient was amenable to restarting antipsychotic, we have chosen Risperdal  as it has more sedating properties than previously prescribed Haldol .  Will hold on discontinuing Prozac  until patient is evaluated by me on 02/24/2024.  Although the patient has been previously stable on an SSRI, I do have some concern that this may now be contributing to the patient cycling into a manic episode.  -Start Risperdal  2 mg nightly -Start Cogentin  0.5 mg nightly -Start hydroxyzine  25 mg nightly as needed

## 2024-02-21 NOTE — Telephone Encounter (Signed)
 Patient came in and the new medication that she is on is preventing her from sleeping well. Patient is thinking bad things, thinking a lot and cannot settle down to sleep. Patient has an appointment with Dr. JAYSON on 02/24/24, but would like a call before that. Please advise. Thank you.

## 2024-02-23 ENCOUNTER — Ambulatory Visit (HOSPITAL_COMMUNITY)
Admission: EM | Admit: 2024-02-23 | Discharge: 2024-02-24 | Disposition: A | Discharge status: Home / Self Care | Attending: Psychiatry | Admitting: Psychiatry

## 2024-02-23 DIAGNOSIS — R000 Tachycardia, unspecified: Secondary | ICD-10-CM | POA: Diagnosis not present

## 2024-02-23 DIAGNOSIS — F419 Anxiety disorder, unspecified: Secondary | ICD-10-CM | POA: Insufficient documentation

## 2024-02-23 DIAGNOSIS — R9431 Abnormal electrocardiogram [ECG] [EKG]: Secondary | ICD-10-CM

## 2024-02-23 DIAGNOSIS — F3130 Bipolar disorder, current episode depressed, mild or moderate severity, unspecified: Secondary | ICD-10-CM | POA: Insufficient documentation

## 2024-02-23 DIAGNOSIS — F22 Delusional disorders: Secondary | ICD-10-CM | POA: Insufficient documentation

## 2024-02-23 DIAGNOSIS — Z79899 Other long term (current) drug therapy: Secondary | ICD-10-CM | POA: Insufficient documentation

## 2024-02-23 DIAGNOSIS — Z62810 Personal history of physical and sexual abuse in childhood: Secondary | ICD-10-CM | POA: Insufficient documentation

## 2024-02-23 LAB — URINALYSIS, ROUTINE W REFLEX MICROSCOPIC
Bilirubin Urine: NEGATIVE
Glucose, UA: NEGATIVE mg/dL
Ketones, ur: NEGATIVE mg/dL
Nitrite: NEGATIVE
Protein, ur: NEGATIVE mg/dL
Specific Gravity, Urine: 1.015 (ref 1.005–1.030)
pH: 5 (ref 5.0–8.0)

## 2024-02-23 LAB — COMPREHENSIVE METABOLIC PANEL WITH GFR
ALT: 23 U/L (ref 0–44)
AST: 20 U/L (ref 15–41)
Albumin: 4.7 g/dL (ref 3.5–5.0)
Alkaline Phosphatase: 175 U/L — ABNORMAL HIGH (ref 38–126)
Anion gap: 17 — ABNORMAL HIGH (ref 5–15)
BUN: 11 mg/dL (ref 6–20)
CO2: 20 mmol/L — ABNORMAL LOW (ref 22–32)
Calcium: 10.3 mg/dL (ref 8.9–10.3)
Chloride: 103 mmol/L (ref 98–111)
Creatinine, Ser: 0.8 mg/dL (ref 0.44–1.00)
GFR, Estimated: >60 mL/min
Glucose, Bld: 91 mg/dL (ref 70–99)
Potassium: 3.8 mmol/L (ref 3.5–5.1)
Sodium: 140 mmol/L (ref 135–145)
Total Bilirubin: 0.5 mg/dL (ref 0.0–1.2)
Total Protein: 7.9 g/dL (ref 6.5–8.1)

## 2024-02-23 LAB — CBC WITH DIFFERENTIAL/PLATELET
Abs Immature Granulocytes: 0.04 K/uL (ref 0.00–0.07)
Basophils Absolute: 0 K/uL (ref 0.0–0.1)
Basophils Relative: 0 %
Eosinophils Absolute: 0.1 K/uL (ref 0.0–0.5)
Eosinophils Relative: 1 %
HCT: 38.6 % (ref 36.0–46.0)
Hemoglobin: 12.5 g/dL (ref 12.0–15.0)
Immature Granulocytes: 0 %
Lymphocytes Relative: 26 %
Lymphs Abs: 2.8 K/uL (ref 0.7–4.0)
MCH: 28.7 pg (ref 26.0–34.0)
MCHC: 32.4 g/dL (ref 30.0–36.0)
MCV: 88.7 fL (ref 80.0–100.0)
Monocytes Absolute: 0.5 K/uL (ref 0.1–1.0)
Monocytes Relative: 4 %
Neutro Abs: 7.6 K/uL (ref 1.7–7.7)
Neutrophils Relative %: 69 %
Platelets: 351 K/uL (ref 150–400)
RBC: 4.35 MIL/uL (ref 3.87–5.11)
RDW: 14.2 % (ref 11.5–15.5)
WBC: 11.1 K/uL — ABNORMAL HIGH (ref 4.0–10.5)
nRBC: 0 % (ref 0.0–0.2)

## 2024-02-23 LAB — SARS CORONAVIRUS 2 BY RT PCR: SARS Coronavirus 2 by RT PCR: NEGATIVE

## 2024-02-23 LAB — POCT URINE DRUG SCREEN - MANUAL ENTRY (I-SCREEN)
POC Amphetamine UR: NOT DETECTED
POC Buprenorphine (BUP): NOT DETECTED
POC Cocaine UR: NOT DETECTED
POC Marijuana UR: NOT DETECTED
POC Methadone UR: NOT DETECTED
POC Methamphetamine UR: NOT DETECTED
POC Morphine: NOT DETECTED
POC Oxazepam (BZO): NOT DETECTED
POC Oxycodone UR: NOT DETECTED
POC Secobarbital (BAR): NOT DETECTED

## 2024-02-23 LAB — VITAMIN D 25 HYDROXY (VIT D DEFICIENCY, FRACTURES): Vit D, 25-Hydroxy: 7.8 ng/mL — ABNORMAL LOW (ref 30–100)

## 2024-02-23 LAB — LIPID PANEL
Cholesterol: 245 mg/dL — ABNORMAL HIGH (ref 0–200)
HDL: 36 mg/dL — ABNORMAL LOW
LDL Cholesterol: 184 mg/dL — ABNORMAL HIGH (ref 0–99)
Total CHOL/HDL Ratio: 6.7 ratio
Triglycerides: 122 mg/dL
VLDL: 24 mg/dL (ref 0–40)

## 2024-02-23 LAB — MAGNESIUM: Magnesium: 2 mg/dL (ref 1.7–2.4)

## 2024-02-23 LAB — ETHANOL: Alcohol, Ethyl (B): <15 mg/dL

## 2024-02-23 LAB — TSH: TSH: 1.86 u[IU]/mL (ref 0.350–4.500)

## 2024-02-23 LAB — POC URINE PREG, ED: Preg Test, Ur: NEGATIVE

## 2024-02-23 MED ORDER — HALOPERIDOL LACTATE 5 MG/ML IJ SOLN: 5.0000 mg | Freq: Three times a day (TID) | INTRAMUSCULAR | Status: DC | PRN

## 2024-02-23 MED ORDER — PROPRANOLOL HCL 10 MG PO TABS
10.0000 mg | ORAL_TABLET | Freq: Once | ORAL | Status: AC
  Administered 2024-02-23: 10 mg via ORAL
  Filled 2024-02-23: qty 1

## 2024-02-23 MED ORDER — MAGNESIUM HYDROXIDE 400 MG/5ML PO SUSP: 30.0000 mL | Freq: Every day | ORAL | Status: DC | PRN

## 2024-02-23 MED ORDER — LORAZEPAM 2 MG/ML IJ SOLN: 2.0000 mg | Freq: Three times a day (TID) | INTRAMUSCULAR | Status: DC | PRN

## 2024-02-23 MED ORDER — ACETAMINOPHEN 325 MG PO TABS
650.0000 mg | ORAL_TABLET | Freq: Four times a day (QID) | ORAL | Status: DC | PRN
  Administered 2024-02-23: 650 mg via ORAL
  Filled 2024-02-23: qty 2

## 2024-02-23 MED ORDER — DIPHENHYDRAMINE HCL 50 MG/ML IJ SOLN: 50.0000 mg | Freq: Three times a day (TID) | INTRAMUSCULAR | Status: DC | PRN

## 2024-02-23 MED ORDER — HALOPERIDOL 5 MG PO TABS: 5.0000 mg | ORAL_TABLET | Freq: Three times a day (TID) | ORAL | Status: DC | PRN

## 2024-02-23 MED ORDER — DIPHENHYDRAMINE HCL 50 MG PO CAPS: 50.0000 mg | ORAL_CAPSULE | Freq: Three times a day (TID) | ORAL | Status: DC | PRN

## 2024-02-23 MED ORDER — BENZTROPINE MESYLATE 0.5 MG PO TABS: 0.5000 mg | ORAL_TABLET | Freq: Two times a day (BID) | ORAL | Status: DC | PRN

## 2024-02-23 MED ORDER — LITHIUM CARBONATE 300 MG PO CAPS: 600.0000 mg | ORAL_CAPSULE | Freq: Every day | ORAL | Status: DC

## 2024-02-23 MED ORDER — BENZTROPINE MESYLATE 0.5 MG PO TABS: 0.5000 mg | ORAL_TABLET | Freq: Every day | ORAL | Status: DC

## 2024-02-23 MED ORDER — ATORVASTATIN CALCIUM 40 MG PO TABS
80.0000 mg | ORAL_TABLET | Freq: Every day | ORAL | Status: DC
  Administered 2024-02-24: 80 mg via ORAL
  Filled 2024-02-23: qty 2

## 2024-02-23 MED ORDER — ALUM & MAG HYDROXIDE-SIMETH 200-200-20 MG/5ML PO SUSP: 30.0000 mL | ORAL | Status: DC | PRN

## 2024-02-23 MED ORDER — HALOPERIDOL LACTATE 5 MG/ML IJ SOLN: 10.0000 mg | Freq: Three times a day (TID) | INTRAMUSCULAR | Status: DC | PRN

## 2024-02-23 MED ORDER — LITHIUM CARBONATE 300 MG PO CAPS
900.0000 mg | ORAL_CAPSULE | Freq: Every day | ORAL | Status: DC
  Administered 2024-02-23: 900 mg via ORAL
  Filled 2024-02-23: qty 3

## 2024-02-23 MED ORDER — RISPERIDONE 2 MG PO TABS
2.0000 mg | ORAL_TABLET | Freq: Every day | ORAL | Status: DC
  Administered 2024-02-23: 2 mg via ORAL
  Filled 2024-02-23: qty 1

## 2024-02-23 MED ORDER — TRAZODONE HCL 50 MG PO TABS: 50.0000 mg | ORAL_TABLET | Freq: Every evening | ORAL | Status: DC | PRN

## 2024-02-23 MED ORDER — HYDROXYZINE HCL 25 MG PO TABS
25.0000 mg | ORAL_TABLET | Freq: Three times a day (TID) | ORAL | Status: DC | PRN
  Administered 2024-02-23: 25 mg via ORAL
  Filled 2024-02-23: qty 1

## 2024-02-23 NOTE — Progress Notes (Signed)
 Pt is admitted to [facility] due to paranoia, mania and worsening insomnia. Pt is alert and oriented with rapid speech. Pt is pleasantly manic. Pt is ambulatory and is oriented to staff/unit. Pt was cooperative with labs and skin assessment. Pt complained headache 2/10. PRN Acetaminophen  was administered per order. Pt denies current SI/HI/AVH, plan or intent. Staff will monitor for pt's safety. Assessment was completed via spanish interpreter [phone].
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
## 2024-02-23 NOTE — ED Provider Notes (Signed)
 [facility] Continuous Assessment Admission H&P  Date: 02/23/2024 Patient Name: Danialle Dement MRN: [id number] Chief Complaint: Mania  Diagnoses:  Final diagnoses:  Bipolar I disorder, most recent episode depressed [facility])    HPI: Laelynn Blizzard is a 49 y.o. spanish speaking female with history of bipolar I disorder and anxiety presenting to behavioral health urgent care accompanied by husband due to concerns of worsening mania.  She is currently established with Dr. Homer outpatient at [facility].  Patient reports recently struggling with insomnia with decreased need for sleep stating she has had approximately 7 hours of total sleep in the past 3 nights.  She reports she has noticed increased manic symptoms since being tapered off haloperidol .  It was initially thought that patient would not require both haloperidol  and lithium  to stabilize patient's bipolar disorder so outpatient provider opted to taper off haloperidol .  Patient endorses symptoms of racing thoughts, pressured speech, increased goal-directed activities, and paranoia.  She reports that she is worried she is going to harm her son that she had previously done so when she was manic.  She states that she would at times with her son up in the middle of the night asking if anyone was hurting for him without clear reason.  She also reports being afraid she might physically harm her husband. She denies SI but does endorse that she did have a terrifying thought 2 days ago about what if she took a bunch of medication and died. She fervently denies wanting to this but the fleeting though terrified her.  Husband present during evaluation.  He also corroborates what patient has stated.  He reports patient has been stable on Haldol , lithium , and fluoxetine  for 8 years.   She is requesting admission to observation unit added evaluation tomorrow to see if she may go directly to [facility] outpatient appointment tomorrow afternoon. She was amenable to increasing lithium  to aid with ongoing manic symptoms.   Total Time spent with patient: 45 minutes  Musculoskeletal  Strength & Muscle Tone: within normal limits Gait & Station: normal Patient leans: N/A  Psychiatric Specialty Exam  Presentation General Appearance: appears stated age hispanic female Eye Contact: good Speech: normal volume and prosody  Mood and Affect  Mood: anxious Affect: unstable, briefly tearful  Thought Process  Thought Processes: circumferential Orientation: axox4 Thought Content: ruminates on ensuring she does not cause harm to family   Hallucinations: denies Ideas of Reference: denies Suicidal Thoughts: denies Homicidal Thoughts:denies  Sensorium  Memory: did not formally assess Judgment: fair Insight: fair  Executive Functions  Concentration: fair Attention Span:fair Recall: fair Fund of Knowledge: fair Language: fair  Psychomotor Activity  Psychomotor Activity: wnl  Assets  Assets: resilience, physical health  Sleep  Sleep: poor   Physical Exam ROS  Blood pressure 132/77, pulse 88, temperature 98.6 F (37 C), temperature source Oral, resp. rate 19, SpO2 99%. There is no height or weight on file to calculate BMI.  Past Psychiatric History: Diagnoses: Anxiety and bipolar disorder  No prior psychiatric admissions    Is the patient at risk to self? No  Has the patient been a risk to self in the past 6 months? No .    Has the patient been a risk to self within the distant past? No   Is the patient a risk to others? Yes   Has the patient been a risk to others in the past 6 months? No   Has the  patient been a risk to others within the distant past? Yes   Past Medical History: history of hyperprolactinemia and dyslipidemia   Last Labs:  No visits with results within 6 Month(s) from this visit.  Latest known visit with results is:   Orders Only on 07/01/2023  Component Date Value Ref Range Status   WBC 07/01/2023 9.2  3.4 - 10.8 x10E3/uL Final   RBC 07/01/2023 4.26  3.77 - 5.28 x10E6/uL Final   Hemoglobin 07/01/2023 12.5  11.1 - 15.9 g/dL Final   Hematocrit 94/77/7974 39.3  34.0 - 46.6 % Final   MCV 07/01/2023 92  79 - 97 fL Final   MCH 07/01/2023 29.3  26.6 - 33.0 pg Final   MCHC 07/01/2023 31.8  31.5 - 35.7 g/dL Final   RDW 94/77/7974 13.3  11.7 - 15.4 % Final   Platelets 07/01/2023 295  150 - 450 x10E3/uL Final   Neutrophils 07/01/2023 66  Not Estab. % Final   Lymphs 07/01/2023 28  Not Estab. % Final   Monocytes 07/01/2023 4  Not Estab. % Final   Eos 07/01/2023 2  Not Estab. % Final   Basos 07/01/2023 0  Not Estab. % Final   Neutrophils Absolute 07/01/2023 6.0  1.4 - 7.0 x10E3/uL Final   Lymphocytes Absolute 07/01/2023 2.6  0.7 - 3.1 x10E3/uL Final   Monocytes Absolute 07/01/2023 0.4  0.1 - 0.9 x10E3/uL Final   EOS (ABSOLUTE) 07/01/2023 0.2  0.0 - 0.4 x10E3/uL Final   Basophils Absolute 07/01/2023 0.0  0.0 - 0.2 x10E3/uL Final   Immature Granulocytes 07/01/2023 0  Not Estab. % Final   Immature Grans (Abs) 07/01/2023 0.0  0.0 - 0.1 x10E3/uL Final   Glucose 07/01/2023 87  70 - 99 mg/dL Final   BUN 94/77/7974 9  6 - 24 mg/dL Final   Creatinine, Ser 07/01/2023 0.71  0.57 - 1.00 mg/dL Final   eGFR 94/77/7974 104  >59 mL/min/1.73 Final   BUN/Creatinine Ratio 07/01/2023 13  9 - 23 Final   Sodium 07/01/2023 142  134 - 144 mmol/L Final   Potassium 07/01/2023 4.5  3.5 - 5.2 mmol/L Final   Chloride 07/01/2023 106  96 - 106 mmol/L Final   CO2 07/01/2023 20  20 - 29 mmol/L Final   Calcium  07/01/2023 10.0  8.7 - 10.2 mg/dL Final   Total Protein 94/77/7974 7.5  6.0 - 8.5 g/dL Final   Albumin 94/77/7974 4.4  3.9 - 4.9 g/dL Final   Globulin, Total 07/01/2023 3.1  1.5 - 4.5 g/dL Final   Bilirubin Total 07/01/2023 0.3  0.0 - 1.2 mg/dL Final   Alkaline Phosphatase 07/01/2023 140 (H)  44 - 121 IU/L Final   AST 07/01/2023  12  0 - 40 IU/L Final   ALT 07/01/2023 14  0 - 32 IU/L Final   Cholesterol, Total 07/01/2023 221 (H)  100 - 199 mg/dL Final   Triglycerides 94/77/7974 124  0 - 149 mg/dL Final   HDL 94/77/7974 39 (L)  >39 mg/dL Final   VLDL Cholesterol Cal 07/01/2023 23  5 - 40 mg/dL Final   LDL Chol Calc (NIH) 07/01/2023 159 (H)  0 - 99 mg/dL Final   Chol/HDL Ratio 07/01/2023 5.7 (H)  0.0 - 4.4 ratio Final   Comment:                                   T. Chol/HDL Ratio  Men  Women                               1/2 Avg.Risk  3.4    3.3                                   Avg.Risk  5.0    4.4                                2X Avg.Risk  9.6    7.1                                3X Avg.Risk 23.4   11.0    Bilirubin, Direct 07/01/2023 0.10  0.00 - 0.40 mg/dL Final   TSH 94/77/7974 2.920  0.450 - 4.500 uIU/mL Final   T4, Total 07/01/2023 9.4  4.5 - 12.0 ug/dL Final   T3 Uptake Ratio 07/01/2023 24  24 - 39 % Final   Free Thyroxine Index 07/01/2023 2.3  1.2 - 4.9 Final   Hgb A1c MFr Bld 07/01/2023 5.4  4.8 - 5.6 % Final   Comment:          Prediabetes: 5.7 - 6.4          Diabetes: >6.4          Glycemic control for adults with diabetes: <7.0    Est. average glucose Bld gHb Est-m* 07/01/2023 108  mg/dL Final   Prolactin 94/77/7974 61.4 (H)  4.8 - 33.4 ng/mL Final   Lithium  Lvl 07/01/2023 0.4 (L)  0.5 - 1.2 mmol/L Final   Comment: A concentration of 0.5-0.8 mmol/L is advised for long-term use; concentrations of up to 1.2 mmol/L may be necessary during acute treatment.                                  Detection Limit = 0.1                           <0.1 indicates None Detected    specimen status report 07/01/2023 Comment   Final   Comment: Please note Please note The date and/or time of collection was not indicated on the requisition as required by state and federal law.  The date of receipt of the specimen was used as the collection date if not supplied.      Allergies: Patient has no known allergies.  Medications:  Facility Ordered Medications  Medication   acetaminophen  (TYLENOL ) tablet 650 mg   alum & mag hydroxide-simeth (MAALOX/MYLANTA) 200-200-20 MG/5ML suspension 30 mL   magnesium  hydroxide (MILK OF MAGNESIA) suspension 30 mL   haloperidol  (HALDOL ) tablet 5 mg   And   diphenhydrAMINE  (BENADRYL ) capsule 50 mg   haloperidol  lactate (HALDOL ) injection 5 mg   And   diphenhydrAMINE  (BENADRYL ) injection 50 mg   And   LORazepam  (ATIVAN ) injection 2 mg   haloperidol  lactate (HALDOL ) injection 10 mg   And   diphenhydrAMINE  (BENADRYL ) injection 50 mg   And   LORazepam  (ATIVAN ) injection 2 mg   hydrOXYzine  (ATARAX ) tablet 25 mg   [START ON 02/24/2024] atorvastatin  (LIPITOR) tablet 80 mg  risperiDONE  (RISPERDAL ) tablet 2 mg   benztropine  (COGENTIN ) tablet 0.5 mg   lithium  carbonate capsule 900 mg   PTA Medications  Medication Sig   atorvastatin  (LIPITOR) 80 MG tablet Take 80 mg by mouth daily.   FLUoxetine  (PROZAC ) 40 MG capsule Take 1 capsule (40 mg total) by mouth daily.   lithium  carbonate 300 MG capsule Take 2 capsules (600 mg total) by mouth at bedtime.   benztropine  (COGENTIN ) 1 MG tablet Take 0.5 tablets (0.5 mg total) by mouth daily.   risperiDONE  (RISPERDAL ) 2 MG tablet Take 1 tablet (2 mg total) by mouth at bedtime.   hydrOXYzine  (ATARAX ) 25 MG tablet Take 1 tablet (25 mg total) by mouth at bedtime as needed for anxiety (insmnia).      Medical Decision Making  Patient's symptoms concerning for recrudescence of mania. Will obtain labs to evaluate for other etiologies. Plan to increase lithium  to 900 mg nightly as patient's last level was subtherapeutic at 0.4. Holding fluoxetine  as well in case this is contributing to manic symptoms. Will continue risperidone  but given history of hyperprolactinemia, may be better to switch back to haloperidol  if patient continues to display manic symptoms and needs a secondary mood  stabilizing agent. Plan to admit to obs unit and will re-evaluate tomorrow. If patient relatively stable, she can follow up with outpatient psychiatrist tomorrow afternoon. Otherwise, patient can be admitted to [facility] for further psychiatric stabilization. Could consider short course of lorazepam  to aid with manic symptoms and allow patient to sleep    Recommendations  Based on my evaluation the patient does not appear to have an emergency medical condition.  -admit to obs -increase lithium  to 900 mg at bedtime -continue risperidone  2 mg at bedtime -change benztropine  to 0.5 mg daily prn for EPS -holding fluoxetine  -agitation protocol PRN -hydroxyzine  25 mg tid prn for anxiety/insomnia  -labs ordered: ECG, cbc, cmp, ethanol, A1c, lipid panel, lithium  level, magnesium , prolactin, tsh, UA w/ reflex, vitamin D  level  Prentice Espy, MD 02/23/2024  3:02 PM

## 2024-02-23 NOTE — ED Notes (Signed)
 Pt A&O x 4,awake & resting, comfortable at present.  Pt is pleasant, Speaks Spanish.  Monitoring for safety.

## 2024-02-24 ENCOUNTER — Ambulatory Visit (INDEPENDENT_AMBULATORY_CARE_PROVIDER_SITE_OTHER): Admitting: Student in an Organized Health Care Education/Training Program

## 2024-02-24 ENCOUNTER — Ambulatory Visit (INDEPENDENT_AMBULATORY_CARE_PROVIDER_SITE_OTHER)
Admission: EM | Admit: 2024-02-24 | Discharge: 2024-02-25 | Disposition: A | Discharge status: Other Acute Inpatient Hospital | Source: Home / Self Care

## 2024-02-24 ENCOUNTER — Other Ambulatory Visit: Payer: Self-pay

## 2024-02-24 ENCOUNTER — Encounter (HOSPITAL_COMMUNITY): Admitting: Student in an Organized Health Care Education/Training Program

## 2024-02-24 DIAGNOSIS — Z79899 Other long term (current) drug therapy: Secondary | ICD-10-CM | POA: Insufficient documentation

## 2024-02-24 DIAGNOSIS — F22 Delusional disorders: Secondary | ICD-10-CM | POA: Insufficient documentation

## 2024-02-24 DIAGNOSIS — G4700 Insomnia, unspecified: Secondary | ICD-10-CM | POA: Diagnosis not present

## 2024-02-24 DIAGNOSIS — F3110 Bipolar disorder, current episode manic without psychotic features, unspecified: Secondary | ICD-10-CM | POA: Insufficient documentation

## 2024-02-24 DIAGNOSIS — F319 Bipolar disorder, unspecified: Secondary | ICD-10-CM

## 2024-02-24 LAB — HEMOGLOBIN A1C
Hgb A1c MFr Bld: 5.8 % — ABNORMAL HIGH (ref 4.8–5.6)
Mean Plasma Glucose: 119.76 mg/dL

## 2024-02-24 LAB — LITHIUM LEVEL: Lithium Lvl: 0.35 mmol/L — ABNORMAL LOW (ref 0.60–1.20)

## 2024-02-24 MED ORDER — LORAZEPAM 2 MG/ML IJ SOLN: 2.0000 mg | Freq: Three times a day (TID) | INTRAMUSCULAR | Status: DC | PRN

## 2024-02-24 MED ORDER — LORAZEPAM 1 MG PO TABS: 1.0000 mg | ORAL_TABLET | Freq: Four times a day (QID) | ORAL | 0 refills | Status: DC | PRN

## 2024-02-24 MED ORDER — ALUM & MAG HYDROXIDE-SIMETH 200-200-20 MG/5ML PO SUSP: 30.0000 mL | ORAL | Status: DC | PRN

## 2024-02-24 MED ORDER — DIPHENHYDRAMINE HCL 50 MG PO CAPS: 50.0000 mg | ORAL_CAPSULE | Freq: Three times a day (TID) | ORAL | Status: DC | PRN

## 2024-02-24 MED ORDER — HALOPERIDOL 5 MG PO TABS
5.0000 mg | ORAL_TABLET | Freq: Three times a day (TID) | ORAL | Status: DC | PRN
  Administered 2024-02-25: 5 mg via ORAL
  Filled 2024-02-24: qty 1

## 2024-02-24 MED ORDER — DIPHENHYDRAMINE HCL 50 MG/ML IJ SOLN: 50.0000 mg | Freq: Three times a day (TID) | INTRAMUSCULAR | Status: DC | PRN

## 2024-02-24 MED ORDER — HALOPERIDOL LACTATE 5 MG/ML IJ SOLN: 10.0000 mg | Freq: Three times a day (TID) | INTRAMUSCULAR | Status: DC | PRN

## 2024-02-24 MED ORDER — LITHIUM CARBONATE 300 MG PO CAPS: 900.0000 mg | ORAL_CAPSULE | Freq: Every day | ORAL | 3 refills | Status: DC

## 2024-02-24 MED ORDER — VITAMIN D (ERGOCALCIFEROL) 1.25 MG (50000 UNIT) PO CAPS: 50000.0000 [IU] | ORAL_CAPSULE | ORAL | Status: DC

## 2024-02-24 MED ORDER — HALOPERIDOL 5 MG PO TABS: 2.5000 mg | ORAL_TABLET | Freq: Two times a day (BID) | ORAL | 1 refills | Status: DC

## 2024-02-24 MED ORDER — ACETAMINOPHEN 325 MG PO TABS: 650.0000 mg | ORAL_TABLET | Freq: Four times a day (QID) | ORAL | Status: DC | PRN

## 2024-02-24 MED ORDER — MAGNESIUM HYDROXIDE 400 MG/5ML PO SUSP: 30.0000 mL | Freq: Every day | ORAL | Status: DC | PRN

## 2024-02-24 MED ORDER — VITAMIN D (ERGOCALCIFEROL) 1.25 MG (50000 UNIT) PO CAPS
50000.0000 [IU] | ORAL_CAPSULE | ORAL | Status: DC
  Administered 2024-02-24: 50000 [IU] via ORAL
  Filled 2024-02-24: qty 1

## 2024-02-24 MED ORDER — TRAZODONE HCL 50 MG PO TABS
50.0000 mg | ORAL_TABLET | Freq: Every evening | ORAL | Status: DC | PRN
  Administered 2024-02-24: 50 mg via ORAL
  Filled 2024-02-24: qty 1

## 2024-02-24 MED ORDER — BENZTROPINE MESYLATE 1 MG PO TABS: 0.5000 mg | ORAL_TABLET | Freq: Two times a day (BID) | ORAL | 0 refills | Status: DC

## 2024-02-24 MED ORDER — HALOPERIDOL LACTATE 5 MG/ML IJ SOLN: 5.0000 mg | Freq: Three times a day (TID) | INTRAMUSCULAR | Status: DC | PRN

## 2024-02-24 NOTE — ED Provider Notes (Incomplete)
 [facility] Continuous Assessment Admission H&P  Date: 02/24/24 Patient Name: Vanessa Harrison MRN: [id number] Chief Complaint: He brought me here  Diagnoses:  Final diagnoses:  Bipolar 1 disorder (HCC)  Paranoia (HCC)    HPI: Triage note: Murdoch is a 49 year old female presenting by [facility] accompanied by her partner. Pt states that she is diagnosed with BP and has not been feeling well. Pt states she has been feeling paranoid today. Pt states she has not been sleeping well due to her medication. Pt mentions she cannot remember what medication she is on, but her medication was recently changed on monday. Pt states she is not sure if this medication is helping her. Pt reports only roughlt 2-5 hours of sleep. Pt states she feels as if someone is going to hurt me. Pt denies substance use, Si, Hi, NSSIB and AVH at this time    Per chart review, patient was evaluated here on 02/24/2024  when she presented in acute  manic state  Total Time spent with patient: {Time; 15 min - 8 hours:17441}  Musculoskeletal  Strength & Muscle Tone: {desc; muscle tone:32375} Gait & Station: {PE GAIT ED WJUO:77474} Patient leans: {Patient Leans:21022755}  Psychiatric Specialty Exam  Presentation General Appearance:  Appropriate for Environment  Eye Contact: Fair  Speech: Clear and Coherent  Speech Volume: Normal  Handedness: Right   Mood and Affect  Mood: Anxious  Affect: Appropriate   Thought Process  Thought Processes: Coherent  Descriptions of Associations:Intact  Orientation:Full (Time, Place and Person)  Thought Content:WDL  Diagnosis of Schizophrenia or Schizoaffective disorder in past: No   Hallucinations:Hallucinations: None  Ideas of Reference:Paranoia  Suicidal Thoughts:Suicidal Thoughts: No  Homicidal Thoughts:Homicidal Thoughts: No   Sensorium  Memory: Immediate Fair; Recent Fair; Remote  Fair  Judgment: Fair  Insight: Fair   Art Therapist  Concentration: Fair  Attention Span: Fair  Recall: [employer] of Knowledge: Fair  Language: Fair   Psychomotor Activity  Psychomotor Activity: Psychomotor Activity: Normal   Assets  Assets: Communication Skills; Desire for Improvement; Physical Health   Sleep  Sleep: Sleep: Poor   Nutritional Assessment (For OBS and FBC admissions only) Has the patient had a weight loss or gain of 10 pounds or more in the last 3 months?: No Has the patient had a decrease in food intake/or appetite?: No Does the patient have dental problems?: No Does the patient have eating habits or behaviors that may be indicators of an eating disorder including binging or inducing vomiting?: No Has the patient recently lost weight without trying?: 0 Has the patient been eating poorly because of a decreased appetite?: 0 Malnutrition Screening Tool Score: 0    Physical Exam ROS  Blood pressure (!) 157/96, pulse (!) 120, temperature 98.1 F (36.7 C), temperature source Oral, resp. rate 20, SpO2 100%. There is no height or weight on file to calculate BMI.  Past Psychiatric History: ***   Is the patient at risk to self? {YES/NO:21197} Has the patient been a risk to self in the past 6 months? {YES/NO:21197}.    Has the patient been a risk to self within the distant past? {YES/NO:21197}  Is the patient a risk to others? {YES/NO:21197}  Has the patient been a risk to others in the past 6 months? {YES/NO:21197}  Has the patient been a risk to others within the distant past? {YES/NO:21197}  Past Medical History: ***  Family History: ***  Social History: ***  Last Labs:  Admission on 02/23/2024, Discharged  on 02/24/2024  Component Date Value Ref Range Status   SARS Coronavirus 2 by RT PCR 02/23/2024 NEGATIVE  NEGATIVE Final   Performed at [facility], [address], [place], [place] [address]   WBC 02/23/2024 11.1  (H)  4.0 - 10.5 K/uL Final   RBC 02/23/2024 4.35  3.87 - 5.11 MIL/uL Final   Hemoglobin 02/23/2024 12.5  12.0 - 15.0 g/dL Final   HCT 98/85/7973 38.6  36.0 - 46.0 % Final   MCV 02/23/2024 88.7  80.0 - 100.0 fL Final   MCH 02/23/2024 28.7  26.0 - 34.0 pg Final   MCHC 02/23/2024 32.4  30.0 - 36.0 g/dL Final   RDW 98/85/7973 14.2  11.5 - 15.5 % Final   Platelets 02/23/2024 351  150 - 400 K/uL Final   nRBC 02/23/2024 0.0  0.0 - 0.2 % Final   Neutrophils Relative % 02/23/2024 69  % Final   Neutro Abs 02/23/2024 7.6  1.7 - 7.7 K/uL Final   Lymphocytes Relative 02/23/2024 26  % Final   Lymphs Abs 02/23/2024 2.8  0.7 - 4.0 K/uL Final   Monocytes Relative 02/23/2024 4  % Final   Monocytes Absolute 02/23/2024 0.5  0.1 - 1.0 K/uL Final   Eosinophils Relative 02/23/2024 1  % Final   Eosinophils Absolute 02/23/2024 0.1  0.0 - 0.5 K/uL Final   Basophils Relative 02/23/2024 0  % Final   Basophils Absolute 02/23/2024 0.0  0.0 - 0.1 K/uL Final   Immature Granulocytes 02/23/2024 0  % Final   Abs Immature Granulocytes 02/23/2024 0.04  0.00 - 0.07 K/uL Final   Performed at [facility], [address], [place], [place] [address]   Sodium 02/23/2024 140  135 - 145 mmol/L Final   Potassium 02/23/2024 3.8  3.5 - 5.1 mmol/L Final   Chloride 02/23/2024 103  98 - 111 mmol/L Final   CO2 02/23/2024 20 (L)  22 - 32 mmol/L Final   Glucose, Bld 02/23/2024 91  70 - 99 mg/dL Final   Glucose reference range applies only to samples taken after fasting for at least 8 hours.   BUN 02/23/2024 11  6 - 20 mg/dL Final   Creatinine, Ser 02/23/2024 0.80  0.44 - 1.00 mg/dL Final   Calcium  02/23/2024 10.3  8.9 - 10.3 mg/dL Final   Total Protein 98/85/7973 7.9  6.5 - 8.1 g/dL Final   Albumin 98/85/7973 4.7  3.5 - 5.0 g/dL Final   AST 98/85/7973 20  15 - 41 U/L Final   ALT 02/23/2024 23  0 - 44 U/L Final   Alkaline Phosphatase 02/23/2024 175 (H)  38 - 126 U/L Final   Total Bilirubin  02/23/2024 0.5  0.0 - 1.2 mg/dL Final   GFR, Estimated 02/23/2024 >60  >60 mL/min Final   Comment: (NOTE) Calculated using the CKD-EPI Creatinine Equation (2021)    Anion gap 02/23/2024 17 (H)  5 - 15 Final   Performed at [facility], [address] [place], [place] [address]   Hgb A1c MFr Bld 02/23/2024 5.8 (H)  4.8 - 5.6 % Final   Comment: (NOTE) Diagnosis of Diabetes The following HbA1c ranges recommended by the American Diabetes Association (ADA) may be used as an aid in the diagnosis of diabetes mellitus.  Hemoglobin             Suggested A1C NGSP%              Diagnosis  <5.7  Non Diabetic  5.7-6.4                Pre-Diabetic  >6.4                   Diabetic  <7.0                   Glycemic control for                       adults with diabetes.     Mean Plasma Glucose 02/23/2024 119.76  mg/dL Final   Performed at [facility], [address] [place], [place] [address]   Magnesium  02/23/2024 2.0  1.7 - 2.4 mg/dL Final   Performed at [facility], [address], [place], [place] [address]   Alcohol, Ethyl (B) 02/23/2024 <15  <15 mg/dL Final   Comment: (NOTE) For medical purposes only. Performed at [facility], [address] [place], [place] [address]    Cholesterol 02/23/2024 245 (H)  0 - 200 mg/dL Final   Comment:        ATP III CLASSIFICATION:  <200     mg/dL   Desirable  799-760  mg/dL   Borderline High  >=759    mg/dL   High           Triglycerides 02/23/2024 122  <150 mg/dL Final   HDL 98/85/7973 36 (L)  >40 mg/dL Final   Total CHOL/HDL Ratio 02/23/2024 6.7  RATIO Final   VLDL 02/23/2024 24  0 - 40 mg/dL Final   LDL Cholesterol 02/23/2024 184 (H)  0 - 99 mg/dL Final   Comment:        Total Cholesterol/HDL:CHD Risk Coronary Heart Disease Risk Table                     Men   Women  1/2 Average Risk   3.4   3.3  Average Risk       5.0   4.4  2 X Average Risk   9.6   7.1  3 X Average Risk  23.4    11.0        Use the calculated Patient Ratio above and the CHD Risk Table to determine the patient's CHD Risk.        ATP III CLASSIFICATION (LDL):  <100     mg/dL   Optimal  899-870  mg/dL   Near or Above                    Optimal  130-159  mg/dL   Borderline  839-810  mg/dL   High  >809     mg/dL   Very High Performed at [facility], [address] [place], [place] [address]    TSH 02/23/2024 1.860  0.350 - 4.500 uIU/mL Final   Performed at [facility], [address] [place], [place] [address]   Preg Test, Ur 02/23/2024 Negative  Negative Final   Color, Urine 02/23/2024 YELLOW  YELLOW Final   APPearance 02/23/2024 CLOUDY (A)  CLEAR Final   Specific Gravity, Urine 02/23/2024 1.015  1.005 - 1.030 Final   pH 02/23/2024 5.0  5.0 - 8.0 Final   Glucose, UA 02/23/2024 NEGATIVE  NEGATIVE mg/dL Final   Hgb urine dipstick 02/23/2024 MODERATE (A)  NEGATIVE Final   Bilirubin Urine 02/23/2024 NEGATIVE  NEGATIVE Final   Ketones, ur 02/23/2024 NEGATIVE  NEGATIVE mg/dL Final   Protein, ur 98/85/7973 NEGATIVE  NEGATIVE mg/dL Final   Nitrite 98/85/7973 NEGATIVE  NEGATIVE Final   Leukocytes,Ua 02/23/2024 SMALL (A)  NEGATIVE Final   RBC / HPF 02/23/2024 0-5  0 - 5 RBC/hpf Final   WBC, UA 02/23/2024 0-5  0 - 5 WBC/hpf Final   Bacteria, UA 02/23/2024 RARE (A)  NONE SEEN Final   Squamous Epithelial / HPF 02/23/2024 11-20  0 - 5 /HPF Final   Mucus 02/23/2024 PRESENT   Final   Hyaline Casts, UA 02/23/2024 PRESENT   Final   Performed at [facility], [address] [place], [place] [address]   POC Amphetamine UR 02/23/2024 None Detected  NONE DETECTED (Cut Off Level 1000 ng/mL) Final   POC Secobarbital (BAR) 02/23/2024 None Detected  NONE DETECTED (Cut Off Level 300 ng/mL) Final   POC Buprenorphine (BUP) 02/23/2024 None Detected  NONE DETECTED (Cut Off Level 10 ng/mL) Final   POC Oxazepam (BZO) 02/23/2024 None Detected  NONE DETECTED (Cut Off Level 300  ng/mL) Final   POC Cocaine UR 02/23/2024 None Detected  NONE DETECTED (Cut Off Level 300 ng/mL) Final   POC Methamphetamine UR 02/23/2024 None Detected  NONE DETECTED (Cut Off Level 1000 ng/mL) Final   POC Morphine  02/23/2024 None Detected  NONE DETECTED (Cut Off Level 300 ng/mL) Final   POC Methadone UR 02/23/2024 None Detected  NONE DETECTED (Cut Off Level 300 ng/mL) Final   POC Oxycodone  UR 02/23/2024 None Detected  NONE DETECTED (Cut Off Level 100 ng/mL) Final   POC Marijuana UR 02/23/2024 None Detected  NONE DETECTED (Cut Off Level 50 ng/mL) Final   Lithium  Lvl 02/23/2024 0.35 (L)  0.60 - 1.20 mmol/L Final   Performed at [facility], [address] [place], [place] [address]   Vit D, 25-Hydroxy 02/23/2024 7.8 (L)  30 - 100 ng/mL Final   Comment: (NOTE) Vitamin D  deficiency has been defined by the Institute of Medicine  and an Endocrine Society practice guideline as a level of serum 25-OH  vitamin D  less than 20 ng/mL (1,2). The Endocrine Society went on to  further define vitamin D  insufficiency as a level between 21 and 29  ng/mL (2).  1. IOM (Institute of Medicine). 2010. Dietary reference intakes for  calcium  and D. [place]: [employer]. 2. Holick MF, Binkley [place], Bischoff-Ferrari HA, et al. Evaluation,  treatment, and prevention of vitamin D  deficiency: an Endocrine  Society clinical practice guideline, JCEM. 2011 Jul; 96(7): 1911-30.  Performed at [facility], [address], [place], [place] [address]     Allergies: Patient has no known allergies.  Medications:  Facility Ordered Medications  Medication   acetaminophen  (TYLENOL ) tablet 650 mg   alum & mag hydroxide-simeth (MAALOX/MYLANTA) 200-200-20 MG/5ML suspension 30 mL   magnesium  hydroxide (MILK OF MAGNESIA) suspension 30 mL   haloperidol  (HALDOL ) tablet 5 mg   And   diphenhydrAMINE  (BENADRYL ) capsule 50 mg   haloperidol  lactate (HALDOL ) injection 5 mg   And    diphenhydrAMINE  (BENADRYL ) injection 50 mg   And   LORazepam  (ATIVAN ) injection 2 mg   haloperidol  lactate (HALDOL ) injection 10 mg   And   diphenhydrAMINE  (BENADRYL ) injection 50 mg   And   LORazepam  (ATIVAN ) injection 2 mg   traZODone  (DESYREL ) tablet 50 mg   PTA Medications  Medication Sig   atorvastatin  (LIPITOR) 80 MG tablet Take 80 mg by mouth daily.   risperiDONE  (RISPERDAL )  2 MG tablet Take 1 tablet (2 mg total) by mouth at bedtime.   hydrOXYzine  (ATARAX ) 25 MG tablet Take 1 tablet (25 mg total) by mouth at bedtime as needed for anxiety (insmnia).   [START ON 03/02/2024] Vitamin D , Ergocalciferol , (DRISDOL ) 1.25 MG (50000 UNIT) CAPS capsule Take 1 capsule (50,000 Units total) by mouth every 7 (seven) days.   benztropine  (COGENTIN ) 1 MG tablet Take 0.5 tablets (0.5 mg total) by mouth 2 (two) times daily.   lithium  carbonate 300 MG capsule Take 3 capsules (900 mg total) by mouth at bedtime.   haloperidol  (HALDOL ) 5 MG tablet Take 0.5 tablets (2.5 mg total) by mouth every 12 (twelve) hours.      Medical Decision Making  ***    Recommendations  {[facility] MSE Recommendations:304701}  Randall Bouquet, NP 02/24/24  10:28 PM

## 2024-02-24 NOTE — Discharge Instructions (Addendum)
 Usted tiene una cita para servicios de medicamentos en [facility] el quince de enero a las tres de la tarde.

## 2024-02-24 NOTE — Progress Notes (Signed)
 BH MD Outpatient Progress Note  02/24/2024 5:36 PM Vanessa Harrison  MRN:  [id number]  Assessment:  Jeneal Vogl presents for follow-up evaluation in-person on 02/24/24.  She is accompanied by her husband.  Overall presentation today is consistent with acute manic decompensation, worsened since the last visit, prompting overnight urgent care observation. Since our phone discussion two days ago, symptoms of mania remain active and only partially controlled, with ongoing agitation, insomnia, and behavioral dysregulation, corroborated by collateral from her husband.  The current episode is most plausibly explained by insufficient mood stabilization, including prior discontinuation of Haldol , continued exposure to Prozac , and subtherapeutic Lithium  dosing, creating vulnerability to relapse. Risperdal , initiated for short-term sedation, is discontinued today due to prior concern for hyperprolactinemia, with risks outweighing benefits in this patient.  Given prior robust response, Haldol  is appropriate to restart, initiated at 2.5 mg twice daily, with Cogentin  for EPS prophylaxis. Prozac  is stopped indefinitely given its likely role in mood destabilization. Lithium  is appropriate to continue at 900 mg nightly as adjusted in the ED, with plan to recheck serum level to confirm therapeutic range. A short, time-limited course of Ativan  is added to address acute agitation and insomnia, with explicit plan to taper as manic symptoms stabilize.  On exam today, there are clear manic features, though no psychosis or suicidal/homicidal ideation. No acute safety concerns; patient is appropriate for discharge home with her husband administering medications and providing supervision.   I have set a reminder to call the patient in 7 days to reassess for response.   Identifying Information: Vanessa Harrison is a 49 y.o. female, who is spanish speaking,with a history of bipolar I disorder  and anxiety who is an established patient with [facility] for management of medications and mood. Initial evaluation by Harl Zane BRAVO, NP completed on 10/18/2019. For a comprehensive history and detailed assessment, please refer to the initial adult assessment.   Plan:  # Bipolar I disorder, current episode manic Past medication trials:  Status of problem: acute Interventions: -- Continue lithium  900 mg nightly (i1/14/2026)  --Li level on 02/23/2024 0.35 subtherapeutic, needs repeat trough at next visit  --TSH and RFTs are WNL, last 02/23/2024 -- STOP prozac  -- STOP risperdal  -- Start Haldol  2.5 mg every 12 hours for acute mania and mood stabilization -- Start Cogentin  0.5 mg every 12 hours for EPS prophylaxis -- Start Ativan  1 mg nightly (scheduled)  x1 PRN for insomnia/agitation  additional 1 mg twice daily as needed for morning and afternoon doses  -- Reminder set to contact patient in 1 week to assess for response; goal is to taper off of this medication as mania stabilizes  Patient was given contact information for behavioral health clinic and was instructed to call 911 for emergencies.   Subjective:  Chief Complaint:  Chief Complaint  Patient presents with   Follow-up   Manic Behavior    Interval History:  The patient reports that she is present with her husband for the interview after being discharged from the behavioral health urgent care downstairs and coming upstairs for her appointment. She reports that she is doing well overall but has not been sleeping, stating that she kept her eyes closed throughout the evening while in observation but never entered a state of sleep. She reports hyperverbal and animated speech when talking. She denies any symptoms of paranoia and denies any auditory or visual hallucinations. She denies any safety concerns.  The patient reports that her husband describes the emergence of manic  symptoms that became concerning  starting on Sunday. She reports that her husband states that after he administered the first dose of Risperdal  that this author ordered during a telephone encounter two days ago, she fell asleep and slept for approximately seven hours but then did not respond the following day. She reports that her husband describes her as becoming intermittently irritable and at times out of touch with reality.  The patient reports that a plan was discussed to transition back to Haldol , as this was the medication that previously stabilized her. She reports that her husband will assist with medication administration and states that he understands the need to seek urgent care or contact 911 or [phone] if symptoms worsen.    Visit Diagnosis:    ICD-10-CM   1. Insomnia, unspecified type  G47.00 haloperidol  (HALDOL ) 5 MG tablet    LORazepam  (ATIVAN ) 1 MG tablet    2. Bipolar I disorder, most recent episode depressed (HCC)  F31.30 benztropine  (COGENTIN ) 1 MG tablet    lithium  carbonate 300 MG capsule    haloperidol  (HALDOL ) 5 MG tablet       Past Psychiatric History:  Diagnoses: Anxiety and bipolar disorder  No prior psychiatric admissions  Social History: Lives in [place] with her husband Children: one adult son Marital status: married Firearms: Denies Unemployed    Social History   Socioeconomic History   Marital status: Married    Spouse name: Not on file   Number of children: Not on file   Years of education: Not on file   Highest education level: Not on file  Occupational History   Not on file  Tobacco Use   Smoking status: Never   Smokeless tobacco: Never  Vaping Use   Vaping status: Never Used  Substance and Sexual Activity   Alcohol use: No   Drug use: No   Sexual activity: Yes    Birth control/protection: Coitus interruptus  Other Topics Concern   Not on file  Social History Narrative   ** Merged History Encounter **       Social Drivers of Health   Tobacco Use: Low Risk  (11/04/2023)   Patient History    Smoking Tobacco Use: Never    Smokeless Tobacco Use: Never    Passive Exposure: Not on file  Financial Resource Strain: Not on file  Food Insecurity: No Food Insecurity (02/23/2024)   Epic    Worried About Programme Researcher, Broadcasting/film/video in the Last Year: Never true    Ran Out of Food in the Last Year: Never true  Transportation Needs: No Transportation Needs (02/23/2024)   Epic    Lack of Transportation (Medical): No    Lack of Transportation (Non-Medical): No  Physical Activity: Not on file  Stress: Not on file  Social Connections: Not on file  Depression (PHQ2-9): Low Risk (09/16/2023)   Depression (PHQ2-9)    PHQ-2 Score: 0  Recent Concern: Depression (PHQ2-9) - Medium Risk (07/01/2023)   Depression (PHQ2-9)    PHQ-2 Score: 5  Alcohol Screen: Not on file  Housing: Not on file  Utilities: Not At Risk (02/23/2024)   Epic    Threatened with loss of utilities: No  Health Literacy: Not on file    Allergies: No Known Allergies  Current Medications: Current Outpatient Medications  Medication Sig Dispense Refill   haloperidol  (HALDOL ) 5 MG tablet Take 0.5 tablets (2.5 mg total) by mouth every 12 (twelve) hours. 30 tablet 1   LORazepam  (ATIVAN ) 1 MG tablet Take 1  tablet (1 mg total) by mouth 4 (four) times daily as needed for anxiety. 30 tablet 0   atorvastatin  (LIPITOR) 80 MG tablet Take 80 mg by mouth daily.     benztropine  (COGENTIN ) 1 MG tablet Take 0.5 tablets (0.5 mg total) by mouth 2 (two) times daily. 60 tablet 0   hydrOXYzine  (ATARAX ) 25 MG tablet Take 1 tablet (25 mg total) by mouth at bedtime as needed for anxiety (insmnia). 30 tablet 0   lithium  carbonate 300 MG capsule Take 3 capsules (900 mg total) by mouth at bedtime. 60 capsule 3   risperiDONE  (RISPERDAL ) 2 MG tablet Take 1 tablet (2 mg total) by mouth at bedtime. 60 tablet 0   [START ON 03/02/2024] Vitamin D , Ergocalciferol , (DRISDOL ) 1.25 MG (50000 UNIT) CAPS capsule Take 1 capsule (50,000 Units  total) by mouth every 7 (seven) days.     No current facility-administered medications for this visit.    ROS: Review of Systems  All other systems reviewed and are negative.   Objective:  Objective: Psychiatric Specialty Exam: General Appearance: Casual, fairly groomed  Eye Contact: Good  Speech:  Clear, coherent, goal, spontaneous  Volume:  Normal   Mood:  see above  Affect: Labile  Thought Content: Logical, concrete at times, some evidence of paranoia the patient attempted to minimize, no delusions.  No AVH  Suicidal Thoughts: see subjective  Thought Process:  Coherent, goal-directed  Orientation:  A&Ox4   Memory:  Immediate good  Judgment:  Fair   Insight:  Fair  Concentration:  Attention and concentration good   Recall:  [employer] of Knowledge: Fair  Language: Fair fluency   Psychomotor Activity: Creased  Akathisia:  NA   AIMS (if indicated): NA   Assets:   Communication Skills Desire for Improvement Housing Social Support  ADL's:  Intact  Cognition: WNL  Sleep: see above  Appetite: see above    Physical Exam Vitals reviewed.  Constitutional:      General: She is not in acute distress.    Appearance: She is not ill-appearing.  HENT:     Head: Normocephalic and atraumatic.  Pulmonary:     Effort: Pulmonary effort is normal. No respiratory distress.  Musculoskeletal:        General: Normal range of motion.  Skin:    General: Skin is warm and dry.  Neurological:     General: No focal deficit present.      Metabolic Disorder Labs: Lab Results  Component Value Date   HGBA1C 5.8 (H) 02/23/2024   MPG 119.76 02/23/2024   Lab Results  Component Value Date   PROLACTIN 61.4 (H) 07/01/2023   PROLACTIN 46.3 (H) 11/11/2022   Lab Results  Component Value Date   CHOL 245 (H) 02/23/2024   TRIG 122 02/23/2024   HDL 36 (L) 02/23/2024   CHOLHDL 6.7 02/23/2024   VLDL 24 02/23/2024   LDLCALC 184 (H) 02/23/2024   LDLCALC 159 (H) 07/01/2023   Lab  Results  Component Value Date   TSH 1.860 02/23/2024   TSH 2.920 07/01/2023    Therapeutic Level Labs: Lab Results  Component Value Date   LITHIUM  0.35 (L) 02/23/2024   LITHIUM  0.4 (L) 07/01/2023   No results found for: VALPROATE No results found for: CBMZ  Screenings:  AIMS    Flowsheet Row Clinical Support from 07/01/2023 in [facility] Video Visit from 03/19/2021 in [facility]  AIMS Total Score 0 0   GAD-7  Flowsheet Row Clinical Support from 07/01/2023 in [facility] Video Visit from 01/28/2023 in [facility] Video Visit from 03/11/2022 in [facility] Video Visit from 11/19/2021 in [facility] Video Visit from 08/19/2021 in [facility]  Total GAD-7 Score 5 0 2 3 5    PHQ2-9    Flowsheet Row Clinical Support from 09/16/2023 in [facility] Clinical Support from 07/01/2023 in [facility] Video Visit from 01/28/2023 in [facility] Video Visit from 11/04/2022 in [facility] Video Visit from 03/11/2022 in [facility]  PHQ-2 Total Score 0 1 0 0 1  PHQ-9 Total Score -- 5 5 8 8    Flowsheet Row ED from 02/23/2024 in [facility] Video Visit from 11/04/2022 in [facility] Video Visit from 03/11/2022 in [facility]  C-SSRS RISK CATEGORY No Risk Low Risk Low Risk    Collaboration of Care:   Patient/Guardian was advised Release of Information must be obtained prior to any record release in order to collaborate their care with an outside provider. Patient/Guardian was advised if they have not already done so to contact the registration department to sign all necessary  forms in order for us  to release information regarding their care.   Consent: Patient/Guardian gives verbal consent for treatment and assignment of benefits for services provided during this visit. Patient/Guardian expressed understanding and agreed to proceed.    Marlo Masson, MD 02/24/2024, 5:36 PM

## 2024-02-24 NOTE — ED Notes (Signed)
Pt awake & resting at present.  No distress noted. Calm & cooperative.  Monitoring for safety.

## 2024-02-24 NOTE — Progress Notes (Signed)
" °   02/24/24 1951  [facility] Triage Screening (Walk-ins at [facility] only)  How Did You Hear About Us ? Family/Friend  What Is the Reason for Your Visit/Call Today? Pt presents to [facility] as a voluntary walk-in, accompanied by her partner with complaint of medication problems and symptoms of Bipolar Disorder. Pt was recently seen at [facility] for similiar complaint. Per partner, pt is not sleeping well and struggling with racing thoughts. Pt was seen at [facility] for medication management earlier today to address mental health needs. Pt currently denies SI,HI,AVH and substance/alcohol use.  How Long Has This Been Causing You Problems? <Week  Have You Recently Had Any Thoughts About Hurting Yourself? No  Are You Planning to Commit Suicide/Harm Yourself At This time? No  Have you Recently Had Thoughts About Hurting Someone [name]? No  Are You Planning To Harm Someone At This Time? No  Physical Abuse Denies  Verbal Abuse Denies  Sexual Abuse Denies  Exploitation of patient/patient's resources Denies  Self-Neglect Denies  Are you currently experiencing any auditory, visual or other hallucinations? No  Have You Used Any Alcohol or Drugs in the Past 24 Hours? No  Do you have any current medical co-morbidities that require immediate attention? No  Clinician description of patient physical appearance/behavior: calm, cooperative  What Do You Feel Would Help You the Most Today? Medication(s);Social Support  If access to [facility] was not available, would you have sought care in the Emergency Department? No  Determination of Need Routine (7 days)  Options For Referral Medication Management    "
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
## 2024-02-24 NOTE — ED Notes (Signed)
 Pt sleeping at present, no distress noted.  Monitoring for safety.

## 2024-02-24 NOTE — ED Provider Notes (Signed)
 FBC/OBS ASAP Discharge Summary  Date and Time: 02/24/2024 10:35 AM  Name: Vanessa Harrison  MRN:  [id number]   Discharge Diagnoses:  Final diagnoses:  Bipolar I disorder, most recent episode depressed (HCC)    Subjective: Per HPI Vanessa Harrison is a 49 y.o. spanish speaking female with history of bipolar I disorder and anxiety presenting to behavioral health urgent care accompanied by husband due to concerns of worsening mania.  She is currently established with Dr. Homer outpatient at [facility].   Patient reports recently struggling with insomnia with decreased need for sleep stating she has had approximately 7 hours of total sleep in the past 3 nights.  She reports she has noticed increased manic symptoms since being tapered off haloperidol .  It was initially thought that patient would not require both haloperidol  and lithium  to stabilize patient's bipolar disorder so outpatient provider opted to taper off haloperidol .  Patient endorses symptoms of racing thoughts, pressured speech, increased goal-directed activities, and paranoia.  She reports that she is worried she is going to harm her son that she had previously done so when she was manic.  She states that she would at times with her son up in the middle of the night asking if anyone was hurting for him without clear reason.  She also reports being afraid she might physically harm her husband. She denies SI but does endorse that she did have a terrifying thought 2 days ago about what if she took a bunch of medication and died. She fervently denies wanting to this but the fleeting though terrified her.   Husband present during evaluation. He also corroborates what patient has stated. He reports patient has been stable on Haldol , lithium , and fluoxetine  for 8 years.   She is requesting admission to observation unit added evaluation tomorrow to see if she may go directly to [facility] outpatient appointment tomorrow afternoon. She was amenable to increasing lithium  to aid with ongoing manic symptoms.  Stay Summary: Patient was admitted to continuous observation unit for symptoms concerning for recrudescence of mania. Labs were obtained and notable for subtherapeutic lithium  level of 0.35, vitamin D  of 7.8, LDL 180, A1C 5.8. Prolactin level pending at time of discharge. Patient's lithium  was increased to 900 mg nightly given subtherapeutic level. Her fluoxetine  was held as well out of caution that is may be contributing to manic symptoms. Risperidone  was continued. Benztropine  was changed to 0.5 mg daily as needed for EPS.  Patient was re-evaluated in the morning. Patient was assessed to be in stable condition, remained calm and behaviorally appropriate on the unit overnight. Patient reported she was no longer having racing thoughts, and stated she felt she could be safe around her family and had no concerns about harming them. She does continue to endorse poor sleep however, and says she only got 2 hours of sleep. Patient was discharged with plan to go directly to her afternoon outpatient appointment at [facility].  Past Psychiatric History: Bipolar I disorder, anxiety Past Medical History: bilateral breast cysts Family Psychiatric History: Unknown Social History: Patient is married and lives with her husband and children in [place]. Tobacco Cessation: N/A, patient does not currently use tobacco products  Current Medications:  Current Facility-Administered Medications  Medication Dose Route Frequency Provider Last Rate Last Admin   acetaminophen  (TYLENOL ) tablet 650 mg  650 mg Oral Q6H PRN Lynnette Barter, MD   650 mg at 02/23/24 1705   alum &  mag hydroxide-simeth (MAALOX/MYLANTA) 200-200-20 MG/5ML suspension 30 mL  30 mL Oral Q4H PRN Lynnette Barter, MD       atorvastatin  (LIPITOR) tablet 80 mg  80 mg Oral Daily Ji, Andrew,  MD   80 mg at 02/24/24 0920   benztropine  (COGENTIN ) tablet 0.5 mg  0.5 mg Oral BID PRN Lynnette Barter, MD       haloperidol  (HALDOL ) tablet 5 mg  5 mg Oral TID PRN Lynnette Barter, MD       And   diphenhydrAMINE  (BENADRYL ) capsule 50 mg  50 mg Oral TID PRN Lynnette Barter, MD       haloperidol  lactate (HALDOL ) injection 5 mg  5 mg Intramuscular TID PRN Lynnette Barter, MD       And   diphenhydrAMINE  (BENADRYL ) injection 50 mg  50 mg Intramuscular TID PRN Lynnette Barter, MD       And   LORazepam  (ATIVAN ) injection 2 mg  2 mg Intramuscular TID PRN Lynnette Barter, MD       haloperidol  lactate (HALDOL ) injection 10 mg  10 mg Intramuscular TID PRN Lynnette Barter, MD       And   diphenhydrAMINE  (BENADRYL ) injection 50 mg  50 mg Intramuscular TID PRN Lynnette Barter, MD       And   LORazepam  (ATIVAN ) injection 2 mg  2 mg Intramuscular TID PRN Lynnette Barter, MD       hydrOXYzine  (ATARAX ) tablet 25 mg  25 mg Oral TID PRN Lynnette Barter, MD   25 mg at 02/23/24 2112   lithium  carbonate capsule 900 mg  900 mg Oral QHS Lynnette Barter, MD   900 mg at 02/23/24 2112   magnesium  hydroxide (MILK OF MAGNESIA) suspension 30 mL  30 mL Oral Daily PRN Lynnette Barter, MD       risperiDONE  (RISPERDAL ) tablet 2 mg  2 mg Oral QHS Lynnette Barter, MD   2 mg at 02/23/24 2112   Vitamin D  (Ergocalciferol ) (DRISDOL ) 1.25 MG (50000 UNIT) capsule 50,000 Units  50,000 Units Oral Q7 days Mannie Vanessa SAILOR, MD   50,000 Units at 02/24/24 9078   Current Outpatient Medications  Medication Sig Dispense Refill   atorvastatin  (LIPITOR) 80 MG tablet Take 80 mg by mouth daily.     benztropine  (COGENTIN ) 1 MG tablet Take 0.5 tablets (0.5 mg total) by mouth daily. 60 tablet 0   FLUoxetine  (PROZAC ) 40 MG capsule Take 1 capsule (40 mg total) by mouth daily. 90 capsule 0   hydrOXYzine  (ATARAX ) 25 MG tablet Take 1 tablet (25 mg total) by mouth at bedtime as needed for anxiety (insmnia). 30 tablet 0   lithium  carbonate 300 MG capsule Take 2 capsules (600 mg total) by mouth at bedtime. 60  capsule 3   risperiDONE  (RISPERDAL ) 2 MG tablet Take 1 tablet (2 mg total) by mouth at bedtime. 60 tablet 0    PTA Medications:  Facility Ordered Medications  Medication   acetaminophen  (TYLENOL ) tablet 650 mg   alum & mag hydroxide-simeth (MAALOX/MYLANTA) 200-200-20 MG/5ML suspension 30 mL   magnesium  hydroxide (MILK OF MAGNESIA) suspension 30 mL   haloperidol  (HALDOL ) tablet 5 mg   And   diphenhydrAMINE  (BENADRYL ) capsule 50 mg   haloperidol  lactate (HALDOL ) injection 5 mg   And   diphenhydrAMINE  (BENADRYL ) injection 50 mg   And   LORazepam  (ATIVAN ) injection 2 mg   haloperidol  lactate (HALDOL ) injection 10 mg   And   diphenhydrAMINE  (BENADRYL ) injection 50 mg   And   LORazepam  (ATIVAN )  injection 2 mg   hydrOXYzine  (ATARAX ) tablet 25 mg   atorvastatin  (LIPITOR) tablet 80 mg   risperiDONE  (RISPERDAL ) tablet 2 mg   lithium  carbonate capsule 900 mg   benztropine  (COGENTIN ) tablet 0.5 mg   [COMPLETED] propranolol  (INDERAL ) tablet 10 mg   Vitamin D  (Ergocalciferol ) (DRISDOL ) 1.25 MG (50000 UNIT) capsule 50,000 Units   PTA Medications  Medication Sig   atorvastatin  (LIPITOR) 80 MG tablet Take 80 mg by mouth daily.   FLUoxetine  (PROZAC ) 40 MG capsule Take 1 capsule (40 mg total) by mouth daily.   lithium  carbonate 300 MG capsule Take 2 capsules (600 mg total) by mouth at bedtime.   benztropine  (COGENTIN ) 1 MG tablet Take 0.5 tablets (0.5 mg total) by mouth daily.   risperiDONE  (RISPERDAL ) 2 MG tablet Take 1 tablet (2 mg total) by mouth at bedtime.   hydrOXYzine  (ATARAX ) 25 MG tablet Take 1 tablet (25 mg total) by mouth at bedtime as needed for anxiety (insmnia).       09/16/2023   12:57 PM 07/01/2023   12:07 PM 01/28/2023   12:48 PM  Depression screen PHQ 2/9  Decreased Interest 0 1 0  Down, Depressed, Hopeless 0 0 0  PHQ - 2 Score 0 1 0  Altered sleeping  0 0  Tired, decreased energy  1 1  Change in appetite  0 1  Feeling bad or failure about yourself   0 0  Trouble  concentrating  2 2  Moving slowly or fidgety/restless  1 1  Suicidal thoughts  0 0  PHQ-9 Score  5  5   Difficult doing work/chores  Not difficult at all Not difficult at all     Data saved with a previous flowsheet row definition    Flowsheet Row ED from 02/23/2024 in [facility] Video Visit from 11/04/2022 in [facility] Video Visit from 03/11/2022 in [facility]  C-SSRS RISK CATEGORY No Risk Low Risk Low Risk    Musculoskeletal  Strength & Muscle Tone: within normal limits Gait & Station: normal Patient leans: N/A  Psychiatric Specialty Exam  Presentation  General Appearance: Appropriate for Environment; Casual  Eye Contact:Good  Speech:Clear and Coherent; Normal Rate  Speech Volume:Normal   Mood and Affect  Mood:Euthymic  Affect:Appropriate; Congruent; Full Range   Thought Process  Thought Processes:Coherent; Goal Directed; Linear  Descriptions of Associations:Intact  Orientation:Full (Time, Place and Person)  Thought Content:WDL  Diagnosis of Schizophrenia or Schizoaffective disorder in past: No  Duration of Psychotic Symptoms: N/A   Hallucinations:Hallucinations: None  Ideas of Reference:None  Suicidal Thoughts:Suicidal Thoughts: No  Homicidal Thoughts:Homicidal Thoughts: No   Sensorium  Memory:Immediate Good; Recent Good  Judgment:Fair  Insight:Good   Executive Functions  Concentration:Good  Attention Span:Good  Recall:Good  Fund of Knowledge:Good  Language:Good   Psychomotor Activity  Psychomotor Activity:Psychomotor Activity: Normal   Assets  Assets:Communication Skills; Desire for Improvement; Physical Health; Resilience; Social Support; Housing  Sleep  Sleep:Sleep: Poor  Physical Exam  Physical Exam Vitals and nursing note reviewed.  Constitutional:      General: She is not in acute distress.    Appearance: Normal appearance. She is  normal weight. She is not ill-appearing.  HENT:     Head: Normocephalic and atraumatic.  Eyes:     Conjunctiva/sclera: Conjunctivae normal.  Pulmonary:     Effort: Pulmonary effort is normal. No respiratory distress.  Musculoskeletal:        General: Normal range of  motion.     Cervical back: Normal range of motion.  Skin:    General: Skin is warm and dry.  Neurological:     General: No focal deficit present.     Mental Status: She is alert and oriented to person, place, and time. Mental status is at baseline.  Psychiatric:        Behavior: Behavior normal.        Thought Content: Thought content normal.    Review of Systems  Gastrointestinal:  Negative for abdominal pain, constipation, diarrhea, nausea and vomiting.  Neurological:  Negative for dizziness and headaches.  Psychiatric/Behavioral:  Negative for hallucinations and suicidal ideas. The patient has insomnia.   All other systems reviewed and are negative.  Blood pressure (!) 130/90, pulse 90, temperature 97.8 F (36.6 C), temperature source Oral, resp. rate 16, SpO2 99%. There is no height or weight on file to calculate BMI.  Demographic Factors:  NA  Loss Factors: NA  Historical Factors: NA  Risk Reduction Factors:   Responsible for children under 27 years of age, Sense of responsibility to family, Religious beliefs about death, Living with another person, especially a relative, Positive social support, and Positive coping skills or problem solving skills  Continued Clinical Symptoms:  Bipolar Disorder:   Depressive phase  Cognitive Features That Contribute To Risk:  None    Suicide Risk:  Minimal: No identifiable suicidal ideation. Patients presenting with no risk factors but with morbid ruminations; may be classified as minimal risk based on the severity of the depressive symptoms  Plan Of Care/Follow-up recommendations:  -Patient has appointment this afternoon with her outpatient psychiatrist Dr.  Homer at [facility] for medication management services.  Disposition: Home  Vanessa LOISE Gravely, MD 02/24/2024, 10:35 AM

## 2024-02-24 NOTE — Patient Instructions (Addendum)
 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 Por favor, est pendiente de una llamada telefnica en 1 semana. Yo le estar llamando para revisar su progreso.  - Vamos a suspender el Risperdal . - Iniciar Haldol  2.5 mg dos veces al [facility]. - Iniciar Cogentin  0.5 mg consolidated [place]. - Continuar tomando litio 900 mg por la noche.  Estamos iniciando un medicamento benzodiacepnico llamado lorazepam  para ayudar con los cambios de nimo y la irritabilidad. Tambin ayudar con el sueo.  Tome lorazepam  1 mg todas las noches; puede tomar 1 mg adicional si lo necesita. Puede tomar lorazepam  1 mg segn sea necesario en la maana o en la tarde. Por favor, trate de documentar con qu frecuencia est tomando las dosis segn sea necesario de lorazepam . El objetivo es retirar este medicamento una vez que los sntomas estn estables.

## 2024-02-25 ENCOUNTER — Encounter (HOSPITAL_COMMUNITY): Payer: Self-pay

## 2024-02-25 ENCOUNTER — Other Ambulatory Visit: Payer: Self-pay

## 2024-02-25 ENCOUNTER — Inpatient Hospital Stay (HOSPITAL_COMMUNITY)
Admission: AD | Admit: 2024-02-25 | Discharge: 2024-03-02 | DRG: 885 | Disposition: A | Discharge status: Home / Self Care | Source: Intra-hospital

## 2024-02-25 DIAGNOSIS — F312 Bipolar disorder, current episode manic severe with psychotic features: Principal | ICD-10-CM | POA: Diagnosis present

## 2024-02-25 DIAGNOSIS — R000 Tachycardia, unspecified: Secondary | ICD-10-CM | POA: Diagnosis not present

## 2024-02-25 DIAGNOSIS — F319 Bipolar disorder, unspecified: Secondary | ICD-10-CM | POA: Diagnosis present

## 2024-02-25 DIAGNOSIS — F419 Anxiety disorder, unspecified: Secondary | ICD-10-CM | POA: Diagnosis present

## 2024-02-25 DIAGNOSIS — Z79899 Other long term (current) drug therapy: Secondary | ICD-10-CM

## 2024-02-25 DIAGNOSIS — E559 Vitamin D deficiency, unspecified: Secondary | ICD-10-CM | POA: Diagnosis present

## 2024-02-25 DIAGNOSIS — G4700 Insomnia, unspecified: Secondary | ICD-10-CM | POA: Diagnosis present

## 2024-02-25 DIAGNOSIS — F22 Delusional disorders: Secondary | ICD-10-CM

## 2024-02-25 DIAGNOSIS — E7800 Pure hypercholesterolemia, unspecified: Secondary | ICD-10-CM | POA: Diagnosis present

## 2024-02-25 DIAGNOSIS — F332 Major depressive disorder, recurrent severe without psychotic features: Principal | ICD-10-CM | POA: Insufficient documentation

## 2024-02-25 DIAGNOSIS — Z62810 Personal history of physical and sexual abuse in childhood: Secondary | ICD-10-CM | POA: Diagnosis not present

## 2024-02-25 DIAGNOSIS — Z818 Family history of other mental and behavioral disorders: Secondary | ICD-10-CM | POA: Diagnosis not present

## 2024-02-25 DIAGNOSIS — R9431 Abnormal electrocardiogram [ECG] [EKG]: Secondary | ICD-10-CM | POA: Diagnosis not present

## 2024-02-25 DIAGNOSIS — F3130 Bipolar disorder, current episode depressed, mild or moderate severity, unspecified: Secondary | ICD-10-CM | POA: Diagnosis not present

## 2024-02-25 LAB — PROLACTIN: Prolactin: 92.7 ng/mL — ABNORMAL HIGH (ref 4.8–33.4)

## 2024-02-25 MED ORDER — BENZTROPINE MESYLATE 0.5 MG PO TABS
0.5000 mg | ORAL_TABLET | Freq: Two times a day (BID) | ORAL | Status: DC
  Administered 2024-02-25 (×2): 0.5 mg via ORAL
  Filled 2024-02-25 (×2): qty 1

## 2024-02-25 MED ORDER — DIPHENHYDRAMINE HCL 25 MG PO CAPS: 50.0000 mg | ORAL_CAPSULE | Freq: Three times a day (TID) | ORAL | Status: DC | PRN

## 2024-02-25 MED ORDER — ACETAMINOPHEN 325 MG PO TABS: 650.0000 mg | ORAL_TABLET | Freq: Four times a day (QID) | ORAL | 0 refills | Status: DC | PRN

## 2024-02-25 MED ORDER — ACETAMINOPHEN 325 MG PO TABS: 650.0000 mg | ORAL_TABLET | Freq: Four times a day (QID) | ORAL | Status: DC | PRN

## 2024-02-25 MED ORDER — TRAZODONE HCL 100 MG PO TABS
100.0000 mg | ORAL_TABLET | Freq: Every evening | ORAL | Status: DC | PRN
  Administered 2024-02-26 – 2024-02-27 (×2): 100 mg via ORAL
  Filled 2024-02-25 (×4): qty 1

## 2024-02-25 MED ORDER — ATORVASTATIN CALCIUM 40 MG PO TABS
80.0000 mg | ORAL_TABLET | Freq: Every day | ORAL | Status: DC
  Administered 2024-02-25: 80 mg via ORAL
  Filled 2024-02-25: qty 2

## 2024-02-25 MED ORDER — LITHIUM CARBONATE 300 MG PO CAPS
900.0000 mg | ORAL_CAPSULE | Freq: Every day | ORAL | Status: DC
  Administered 2024-02-25: 900 mg via ORAL
  Filled 2024-02-25: qty 3

## 2024-02-25 MED ORDER — MAGNESIUM HYDROXIDE 400 MG/5ML PO SUSP
30.0000 mL | Freq: Every day | ORAL | Status: DC | PRN
  Administered 2024-02-26 – 2024-02-27 (×2): 30 mL via ORAL
  Filled 2024-02-25 (×2): qty 30

## 2024-02-25 MED ORDER — DIPHENHYDRAMINE HCL 50 MG/ML IJ SOLN: 50.0000 mg | Freq: Three times a day (TID) | INTRAMUSCULAR | Status: DC | PRN

## 2024-02-25 MED ORDER — HALOPERIDOL 5 MG PO TABS: 5.0000 mg | ORAL_TABLET | Freq: Three times a day (TID) | ORAL | Status: DC | PRN

## 2024-02-25 MED ORDER — DIPHENHYDRAMINE HCL 50 MG/ML IJ SOLN
INTRAMUSCULAR | Status: AC
  Administered 2024-02-25: 50 mg
  Filled 2024-02-25: qty 1

## 2024-02-25 MED ORDER — HALOPERIDOL LACTATE 5 MG/ML IJ SOLN: 5.0000 mg | Freq: Three times a day (TID) | INTRAMUSCULAR | Status: DC | PRN

## 2024-02-25 MED ORDER — HYDROXYZINE HCL 50 MG PO TABS
50.0000 mg | ORAL_TABLET | Freq: Four times a day (QID) | ORAL | Status: DC | PRN
  Administered 2024-02-26 – 2024-03-01 (×5): 50 mg via ORAL
  Filled 2024-02-25 (×5): qty 1
  Filled 2024-02-25: qty 10
  Filled 2024-02-25: qty 1

## 2024-02-25 MED ORDER — LORAZEPAM 2 MG/ML IJ SOLN
INTRAMUSCULAR | Status: AC
  Administered 2024-02-25: 2 mg
  Filled 2024-02-25: qty 1

## 2024-02-25 MED ORDER — HALOPERIDOL LACTATE 5 MG/ML IJ SOLN
INTRAMUSCULAR | Status: AC
  Administered 2024-02-25: 5 mg
  Filled 2024-02-25: qty 2

## 2024-02-25 MED ORDER — HALOPERIDOL 2 MG PO TABS
2.5000 mg | ORAL_TABLET | Freq: Two times a day (BID) | ORAL | Status: DC
  Administered 2024-02-25: 2.5 mg via ORAL
  Filled 2024-02-25: qty 1

## 2024-02-25 MED ORDER — ALUM & MAG HYDROXIDE-SIMETH 200-200-20 MG/5ML PO SUSP: 30.0000 mL | ORAL | 0 refills | Status: DC | PRN

## 2024-02-25 MED ORDER — ALUM & MAG HYDROXIDE-SIMETH 200-200-20 MG/5ML PO SUSP: 30.0000 mL | ORAL | Status: DC | PRN

## 2024-02-25 MED ORDER — LORAZEPAM 2 MG/ML IJ SOLN: 2.0000 mg | Freq: Three times a day (TID) | INTRAMUSCULAR | Status: DC | PRN

## 2024-02-25 NOTE — ED Notes (Signed)
 Patient is transferring to [facility] at this time via safe transport. Voluntary consent, EMTALA, and other transfer paperwork sent with patient and provided to transport. Patient is cooperative at time of transport. All valuables/belongings sent with patient. Patient in no current distress.

## 2024-02-25 NOTE — Plan of Care (Signed)
  Problem: Safety: Goal: Violent Restraint(s) Outcome: Progressing
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
## 2024-02-25 NOTE — Plan of Care (Signed)
  Problem: Education: Goal: Knowledge of [facility] Education information/materials will improve Outcome: Not Progressing Goal: Verbalization of understanding the information provided will improve Outcome: Not Progressing

## 2024-02-25 NOTE — ED Notes (Signed)
Patient resting with no s/s of distress

## 2024-02-25 NOTE — ED Notes (Signed)
 Pt A&O x 4, speaks spanish pressents with medication problem.  Pt pleasant & interactive.  Denies SI.  Monitoring for safety.

## 2024-02-25 NOTE — Group Note (Unsigned)
 Date:  02/25/2024 Time:  8:17 PM  Group Topic/Focus:  Wrap-Up Group:   The focus of this group is to help patients review their daily goal of treatment and discuss progress on daily workbooks.     Participation Level:  {BHH PARTICIPATION OZCZO:77735}  Participation Quality:  {BHH PARTICIPATION QUALITY:22265}  Affect:  {BHH AFFECT:22266}  Cognitive:  {BHH COGNITIVE:22267}  Insight: {BHH Insight2:20797}  Engagement in Group:  {BHH ENGAGEMENT IN HMNLE:77731}  Modes of Intervention:  {BHH MODES OF INTERVENTION:22269}  Additional Comments:  ***  Vanessa Harrison [place] 02/25/2024, 8:17 PM

## 2024-02-25 NOTE — Progress Notes (Signed)
(  Sleep Hours) -3.75  (Any PRNs that were needed, meds refused, or side effects to meds)- none  (Any disturbances and when (visitation, over night)-none  (Concerns raised by the patient)- pt received IMs earlier in day has been sleep much of the shift, very minimal interaction during assessment on night shift, pt got up during the night and got a snack and spoke to spanish speaking staff , pt was disorganized and paranoid. Pt comes to the nursing station numerous times requesting something to drink, when pt is given the drink the pt sticks her pinky finger in the drink and states it is not the drink she requested and refuses.     (SI/HI/AVH)-N/A

## 2024-02-25 NOTE — Group Note (Signed)
 Date:  02/25/2024 Time:  8:36 PM  Group Topic/Focus:  Wrap-Up Group:   The focus of this group is to help patients review their daily goal of treatment and discuss progress on daily workbooks.    Participation Level:  Did Not Attend  PFrancis, Sereena Marando Dacosta 02/25/2024, 8:36 PM
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
## 2024-02-25 NOTE — Discharge Instructions (Addendum)
 Please transfer this patient to the [facility] for a higher level of care for the treatment and stabilization of her mental health status.
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
## 2024-02-25 NOTE — Progress Notes (Signed)
 Pt has been accepted to [facility] on 02/25/2024 Bed assignment: 306-02  Pt meets inpatient criteria per: Starlyn Patron NP  Attending Physician will be: Dr. Raliegh MD   Report can be called to: Adult unit: [phone]  Pt can arrive after RN WILL UPDATE   Care Team Notified: [facility] Cherylynn Ernst RN, Felton Gainer RN  [place] Varetta Chavers LCSW  02/25/2024 11:54 AM

## 2024-02-25 NOTE — Plan of Care (Signed)
   Problem: Safety: Goal: Periods of time without injury will increase Outcome: Progressing
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
## 2024-02-25 NOTE — Group Note (Unsigned)
 Date:  02/25/2024 Time:  8:18 PM  Group Topic/Focus:  Wrap-Up Group:   The focus of this group is to help patients review their daily goal of treatment and discuss progress on daily workbooks.     Participation Level:  {BHH PARTICIPATION OZCZO:77735}  Participation Quality:  {BHH PARTICIPATION QUALITY:22265}  Affect:  {BHH AFFECT:22266}  Cognitive:  {BHH COGNITIVE:22267}  Insight: {BHH Insight2:20797}  Engagement in Group:  {BHH ENGAGEMENT IN HMNLE:77731}  Modes of Intervention:  {BHH MODES OF INTERVENTION:22269}  Additional Comments:  ***  [name] 02/25/2024, 8:18 PM

## 2024-02-26 DIAGNOSIS — F312 Bipolar disorder, current episode manic severe with psychotic features: Principal | ICD-10-CM | POA: Diagnosis present

## 2024-02-26 MED ORDER — HALOPERIDOL 5 MG PO TABS
10.0000 mg | ORAL_TABLET | Freq: Every day | ORAL | Status: DC
  Administered 2024-02-26: 10 mg via ORAL
  Filled 2024-02-26: qty 2

## 2024-02-26 MED ORDER — ATORVASTATIN CALCIUM 40 MG PO TABS
80.0000 mg | ORAL_TABLET | Freq: Every day | ORAL | Status: DC
  Administered 2024-02-26 – 2024-03-02 (×6): 80 mg via ORAL
  Filled 2024-02-26 (×3): qty 2
  Filled 2024-02-26: qty 14
  Filled 2024-02-26 (×3): qty 2

## 2024-02-26 MED ORDER — LORAZEPAM 1 MG PO TABS
2.0000 mg | ORAL_TABLET | Freq: Every day | ORAL | Status: DC
  Administered 2024-02-26: 2 mg via ORAL
  Filled 2024-02-26: qty 2

## 2024-02-26 MED ORDER — HALOPERIDOL 5 MG PO TABS
5.0000 mg | ORAL_TABLET | Freq: Every day | ORAL | Status: DC
  Administered 2024-02-26 – 2024-02-27 (×2): 5 mg via ORAL
  Filled 2024-02-26 (×2): qty 1

## 2024-02-26 MED ORDER — LITHIUM CARBONATE 300 MG PO CAPS
900.0000 mg | ORAL_CAPSULE | Freq: Every day | ORAL | Status: DC
  Administered 2024-02-26 – 2024-03-01 (×5): 900 mg via ORAL
  Filled 2024-02-26 (×3): qty 3
  Filled 2024-02-26: qty 21
  Filled 2024-02-26 (×2): qty 3

## 2024-02-26 MED ORDER — VITAMIN D 25 MCG (1000 UNIT) PO TABS
1000.0000 [IU] | ORAL_TABLET | Freq: Every day | ORAL | Status: DC
  Administered 2024-02-26 – 2024-03-02 (×6): 1000 [IU] via ORAL
  Filled 2024-02-26 (×3): qty 1
  Filled 2024-02-26: qty 7
  Filled 2024-02-26 (×3): qty 1

## 2024-02-26 NOTE — Social Work (Signed)
 Adult Psychoeducational Group Note  Date:  02/26/2024 Time:  1:28 PM  Group Topic/Focus:  Healthy Communication:   The focus of this group is to discuss communication, barriers to communication, as well as healthy ways to communicate with others.  Participation Level:  Active  Participation Quality:  Appropriate  Affect:  Flat  Cognitive:  Appropriate  Insight: Good  Engagement in Group:  Limited  Modes of Intervention:  Education  Additional Comments:    Annett Berle Hoyer 02/26/2024, 1:28 PM

## 2024-02-26 NOTE — Plan of Care (Addendum)
 Pt assessment done via in person interpreter.  She's A & O to self and place. Denies SI, HI, VH and pain when assessed. Presented with fair eye contact, animated but anxious  on interactions. Reports +AH I hear people talking but when I look, I don't see nobody. Reports poor sleep last night, I kept getting up because people were talking to me inside my head. Showered, attended to her ADLs. Tolerated meals, fluids and medications well. Safety checks maintained at Q 15 minutes intervals without outburst. Emotional support, encouragement and reassurance offered.   Problem: Activity: Goal: Interest or engagement in activities will improve Outcome: Progressing   Problem: Activity: Goal: Sleeping patterns will improve Outcome: Not Progressing
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
## 2024-02-26 NOTE — Group Note (Unsigned)
 Date:  02/26/2024 Time:  9:35 AM  Group Topic/Focus:  Goals Group:   The focus of this group is to help patients establish daily goals to achieve during treatment and discuss how the patient can incorporate goal setting into their daily lives to aide in recovery.     Participation Level:  {BHH PARTICIPATION OZCZO:77735}  Participation Quality:  {BHH PARTICIPATION QUALITY:22265}  Affect:  {BHH AFFECT:22266}  Cognitive:  {BHH COGNITIVE:22267}  Insight: {BHH Insight2:20797}  Engagement in Group:  {BHH ENGAGEMENT IN HMNLE:77731}  Modes of Intervention:  {BHH MODES OF INTERVENTION:22269}  Additional Comments:  ***  [name] 02/26/2024, 9:35 AM

## 2024-02-26 NOTE — H&P (Signed)
 Psychiatric Admission Assessment Adult  Patient Identification: Vanessa Harrison MRN:  [id number] Date of Evaluation:  02/26/2024 Chief Complaint:  MDD (major depressive disorder), recurrent episode, severe (HCC) [F33.2] Principal Diagnosis:  Bipolar disorder, unspecified (HCC) Diagnosis:  Principal Problem:   Bipolar disorder, unspecified (HCC)   SUBJECTIVE:  CC:   My husband brought me here  HPI: Vanessa Harrison is a 49 y.o. female  with a past psychiatric history of bipolar I disorder and anxiety. Patient initially arrived to [facility] on 02/23/24 for mania and was admitted to [facility] voluntarily on 02/25/24 for acute safety concerns, impaired functioning, and stabilization of acute on chronic psychiatric conditions. PMHx is significant for hypercholesterolemia.  Patient interviewed with the assistance of in-person Spanish interpreter.  On interview, patient states she was brought to the hospital because her husband wanted her to come. Patient states she was feeling paranoid, but is unable to elaborate further.  Patient begins telling treatment team how she was brought into the hospital yesterday and told a staff member that he looked like her nephew. Patient states she was not being aggressive or trying to harm anybody, but because she told the staff member that he looked similar to her nephew, she was given an injection, and points to her buttocks.  Patient holds her wrist up and shows bruising on her left and right dorsal wrists, and states it was caused by the restraint chair.   Patient appears very confused on interview, and has difficulty responding to questions regarding her psychiatric or medical history. She is unable to recall previous medications. She frequently pauses to think back to her history, even to answer simple questions regarding past hospitalizations. Questions frequently require rephrasing to get an appropriate answer. Patient often inappropriately responds to  questions, and instead continues to perseverate on the injection she received yesterday and being put in the restraint chair.  Given patient is a native Spanish speaker, asked medical interpreter for her opinion on patient's thought process, and interpreter stated patient's thought pattern was largely nonsensical and disorganized.  Patient denies history of suicide attempts. Reports family history of bipolar disorder in 2 nieces. Denies substance use. Denies AVH. Denies history of seizures, thyroid  disease.   Past Psychiatric History: Current psychiatrist: Dr. Marlo Masson Current therapist: None Previous psychiatric diagnoses: bipolar I, anxiety Psychiatric Medications:  -Home Meds: lithium , haldol , benztropine , ativan , hydroxyzine   -Past Med Trials: Patient unable to recall Psychiatric hospitalization(s): reports prior hospitalization when she was living in [place] Psychotherapy history: None Neuromodulation history: None History of suicide (obtained from HPI): No History of homicide or aggression (obtained in HPI): During manic episodes, patient can become aggressive towards others and family NSSIB: No  Substance Abuse History: Patient denies current or prior tobacco, significant alcohol, or substance abuse   Past Medical History: Medical diagnoses: bilateral breast cysts Medications: atorvastatin  80 mg daily, vitamin D  Allergies: NKDA Trauma: Denies Seizures: Denies  Social History: Patient lives with her husband. She has a 21 year old son.  Family Psychiatric History: Two nieces with bipolar disorder  Columbia Scale:  Flowsheet Row Ad[facility] (Current) [facility] from 02/24/2024 in [facility] ED from 02/23/2024 in [facility]  C-SSRS RISK CATEGORY No Risk No Risk No Risk     Tobacco Screening:  Tobacco Use History[1]  BH Tobacco Counseling      Are you interested in Tobacco Cessation Medications?  No, patient refused Counseled patient on smoking cessation:  Refused/Declined practical  counseling Reason Tobacco Screening Not Completed: Patient Refused Screening    Allergies:   Allergies[2]  OBJECTIVE:  Physical Exam Vitals and nursing note reviewed.  Constitutional:      General: She is not in acute distress.    Appearance: Normal appearance. She is normal weight. She is not ill-appearing.  HENT:     Head: Normocephalic and atraumatic.     Nose: Nose normal.  Eyes:     Extraocular Movements: Extraocular movements intact.     Conjunctiva/sclera: Conjunctivae normal.     Comments: Visual fields full.  Pulmonary:     Effort: Pulmonary effort is normal. No respiratory distress.  Musculoskeletal:        General: No swelling, deformity or signs of injury. Normal range of motion.     Cervical back: Normal range of motion.     Comments: 5/5 strength in all extremities.  Skin:    General: Skin is warm and dry.     Capillary Refill: Capillary refill takes less than 2 seconds.     Comments: 3-5 cm cluster of bruising on left dorsal forearm near wrist  Neurological:     General: No focal deficit present.     Mental Status: She is alert and oriented to person, place, and time. Mental status is at baseline.    Review of Systems  Gastrointestinal:  Negative for abdominal pain, constipation, diarrhea, nausea and vomiting.  Neurological:  Negative for dizziness and headaches.  All other systems reviewed and are negative.  Blood pressure 124/72, pulse (!) 109, temperature 98.1 F (36.7 C), temperature source Oral, resp. rate 18, height 5' 4 (1.626 m), weight 78.7 kg, SpO2 97%. Body mass index is 29.76 kg/m.  Metabolic disorder labs:  Lab Results  Component Value Date   HGBA1C 5.8 (H) 02/23/2024   MPG 119.76 02/23/2024   Lab Results  Component Value Date   PROLACTIN 92.7 (H) 02/23/2024   PROLACTIN 61.4 (H) 07/01/2023    Lab Results  Component Value Date   CHOL 245 (H) 02/23/2024   TRIG 122 02/23/2024   HDL 36 (L) 02/23/2024   CHOLHDL 6.7 02/23/2024   VLDL 24 02/23/2024   LDLCALC 184 (H) 02/23/2024   LDLCALC 159 (H) 07/01/2023    No results found for this or any previous visit (from the past 48 hours).  Blood alcohol level:  Lab Results  Component Value Date   [facility] <15 02/23/2024    Current Medications: Current Facility-Administered Medications  Medication Dose Route Frequency Provider Last Rate Last Admin   acetaminophen  (TYLENOL ) tablet 650 mg  650 mg Oral Q6H PRN Tex Drilling, NP       alum & mag hydroxide-simeth (MAALOX/MYLANTA) 200-200-20 MG/5ML suspension 30 mL  30 mL Oral Q4H PRN Tex Drilling, NP       atorvastatin  (LIPITOR) tablet 80 mg  80 mg Oral Daily Zandon Talton N, MD   80 mg at 02/26/24 1305   haloperidol  (HALDOL ) tablet 5 mg  5 mg Oral TID PRN Pashayan, Alexander S, DO       And   diphenhydrAMINE  (BENADRYL ) capsule 50 mg  50 mg Oral TID PRN Raliegh Marsa RAMAN, DO       haloperidol  lactate (HALDOL ) injection 5 mg  5 mg Intramuscular TID PRN Raliegh Marsa RAMAN, DO       And   diphenhydrAMINE  (BENADRYL ) injection 50 mg  50 mg Intramuscular TID PRN Raliegh Marsa RAMAN, DO       And   LORazepam  (ATIVAN ) injection  2 mg  2 mg Intramuscular TID PRN Pashayan, Alexander S, DO       haloperidol  (HALDOL ) tablet 10 mg  10 mg Oral QHS Mannie [name] SAILOR, MD       haloperidol  (HALDOL ) tablet 5 mg  5 mg Oral Daily Mannie [name] SAILOR, MD   5 mg at 02/26/24 1305   hydrOXYzine  (ATARAX ) tablet 50 mg  50 mg Oral Q6H PRN Tex Drilling, NP       lithium  carbonate capsule 900 mg  900 mg Oral QHS Mannie [name] SAILOR, MD       LORazepam  (ATIVAN ) tablet 2 mg  2 mg Oral QHS Mannie [name] SAILOR, MD       magnesium  hydroxide (MILK OF MAGNESIA) suspension 30 mL  30 mL Oral Daily PRN Tex Drilling, NP   30 mL at 02/26/24 1342   traZODone  (DESYREL ) tablet 100 mg  100 mg Oral QHS PRN Tex Drilling,  NP        PTA Medications: Medications Prior to Admission  Medication Sig Dispense Refill Last Dose/Taking   atorvastatin  (LIPITOR) 80 MG tablet Take 80 mg by mouth daily.   02/24/2024   benztropine  (COGENTIN ) 1 MG tablet Take 0.5 mg by mouth 2 (two) times daily.   02/25/2024   haloperidol  (HALDOL ) 5 MG tablet Take 2.5 mg by mouth every 12 (twelve) hours.   02/25/2024   hydrOXYzine  (ATARAX ) 25 MG tablet Take 25 mg by mouth at bedtime as needed (For anxiety or insomnia).   02/23/2024   lithium  carbonate 300 MG capsule Take 900 mg by mouth at bedtime.   02/25/2024   LORazepam  (ATIVAN ) 1 MG tablet Take 1 mg by mouth 4 (four) times daily as needed for anxiety.   Unknown   Vitamin D , Ergocalciferol , (DRISDOL ) 1.25 MG (50000 UNIT) CAPS capsule Take 50,000 Units by mouth every 7 (seven) days.   02/24/2024    Mental Status Exam:  Appearance: casual, fairly-groomed middle-aged female  Behavior: appropriate eye contact  Attitude: cooperative, polite  Speech: hyperverbal, normal tone and rhythm, interruptible  Mood: okay  Affect: congruent, restricted  Thought Process: disorganized, tangential, responding inappropriately to questions  Thought Content: perseverative about staff giving her IM injection; no overtly delusional or paranoid thought content  SI/HI: Denies  Perceptions: Denies ; not observed responding to internal stimuli  Judgement: Poor  Insight: Poor  Fund of Knowledge: WNL    ASSESSMENT: Patient is a 49 year old female with a past psychiatric history of bipolar I disorder and anxiety presenting with worsening irritability, intrusive behavior, insomnia with decreased need for sleep, tangentiality, disorganized thoughts, and hyperverbal speech most consistent with acute mania in the context of recent changes to medication regimen. Prior to current manic episode, patient had been stable on regimen of Haldol , lithium , and fluoxetine  for several years. Recently patient was tapered off of  Haldol  by outpatient provider as it was thought patient would likely maintain stability of bipolar disorder on lithium  alone. Following discontinuation of haldol , patient appears to have decompensated and had a reemergence of manic symptoms. While patient was at [facility], her lithium  level was found to be subtherapeutic at 0.35, therefore lithium  dose was increased to 900 mg.  Given the stability patient has had on combination of lithium  and haldol , we will restart those medications. Plan to continue lithium  at current dose of 900 mg nightly. Additionally, will start haldol , but at a higher dose than patient was previously taking in order to facilitate quicker resolution of mania. Will add on Ativan   2 mg nightly to promote sleep. Will discontinue benztropine  as patient does not have a recent history or current symptoms of EPS and to reduce anticholinergic burden; can consider restarting if patient does become symptomatic.   PLAN:  Psychiatric Diagnoses and Treatment  # Bipolar I disorder -- Lithium  900 mg nightly  --check level in 5 days -- Haldol  5 mg qAM + 10 mg at bedtime -- Ativan  2 mg qhs  -- Discontinued benztropine  - no current or recent symptoms of EPS  PRN's - Trazodone  50 mg at bedtime as needed for insomnia - Atarax  25 mg TID as needed for anxiety - Agitation Protocol: haldol  + ativan  + benadryl   2. Active Medical Issues  #Nicotine withdrawal - Patient does not need nicotine replacement  Other as needed medications  Tylenol  650 mg every 6 hours as needed for pain Mylanta 30 mL every 4 hours as needed for indigestion Milk of magnesia 30 mL daily as needed for constipation  The risks/benefits/side-effects/alternatives to the above medication(s) were discussed in detail with the patient and time was given for questions. The patient consents to medication trial. FDA black box warnings, if present, were discussed.  3. Safety and Monitoring: - Voluntary admission to inpatient  psychiatric unit for safety, stabilization and treatment - Daily contact with patient to assess and evaluate symptoms and progress in treatment - Patient's case to be discussed in multi-disciplinary team meeting - Observation Level: q15 minute checks - Vital signs:  q12 hours - Precautions: suicide, elopement, and assault  4. Routine and other pertinent labs: EKG monitoring: QTc: 462 on 02/25/24  Metabolism / endocrine: BMI: Body mass index is 29.76 kg/m.  CBC: WBC 11.1 CMP: Alk phos 175 UDS: negative Ethanol: <10 UA: unremarkable Pregnancy test: negative TSH: WNL Vitamin D : 7.8 A1c: 5.8 Lipid panel: LDL 184, total cholesterol 245  5.   Group Therapy: - Encouraged patient to participate in unit milieu and in scheduled group therapies  - Short Term Goals: Ability to identify changes in lifestyle to reduce recurrence of condition will improve and Ability to demonstrate self-control will improve - Long Term Goals: Improvement in symptoms so as ready for discharge - Patient is encouraged to participate in group therapy while admitted to the psychiatric unit. - We will address other chronic and acute stressors, which contributed to the patient's Bipolar disorder, unspecified (HCC) in order to reduce the risk of self-harm at discharge.  7.   Discharge Planning:  - Social work and case management to assist with discharge planning and identification of hospital follow-up needs prior to discharge - Estimated LOS: 5-7 days - Discharge Concerns: Need to establish a safety plan; Medication compliance and effectiveness - Discharge Goals: Return home with outpatient referrals for mental health follow-up including medication management/psychotherapy  I certify that inpatient services furnished can reasonably be expected to improve the patient's condition.      [name] LOISE Gravely, MD PGY-1, Psychiatry Residency  1/17/20261:58 PM       [1]  Social History Tobacco Use  Smoking Status Never   Smokeless Tobacco Never  [2] No Known Allergies

## 2024-02-26 NOTE — Plan of Care (Signed)

## 2024-02-26 NOTE — Group Note (Signed)
 Date:  02/26/2024 Time:  3:42 PM  Group Topic/Focus:  Emotional Wellness. Healthy choices.    Participation Level:  Did Not Attend

## 2024-02-26 NOTE — [facility] Group Notes (Signed)
 Adult Psychoeducational Group Note  Date:  02/26/2024 Time:  11:15 AM  Group Topic/Focus:  Goals Group:   The focus of this group is to help patients establish daily goals to achieve during treatment and discuss how the patient can incorporate goal setting into their daily lives to aide in recovery.  Participation Level:  Did Not Attend  Participation Quality:    Affect:    Cognitive:    Insight:   Engagement in Group:    Modes of Intervention:    Additional Comments:    Annett Berle Hoyer 02/26/2024, 11:15 AM

## 2024-02-26 NOTE — [facility] Group Notes (Signed)
 Adult Psychoeducational Group Note  Date:  02/26/2024 Time:  8:52 PM  Group Topic/Focus:  Wrap-Up Group:   The focus of this group is to help patients review their daily goal of treatment and discuss progress on daily workbooks.  Participation Level:  Active  Participation Quality:  Appropriate  Affect:  Appropriate  Cognitive:  Appropriate  Insight: Appropriate  Engagement in Group:  Engaged  Modes of Intervention:  Discussion  Additional Comments:  Pt Attended group.   Drue Pouch 02/26/2024, 8:52 PM

## 2024-02-26 NOTE — Group Note (Signed)
 LCSW Group Therapy Note  Group Date: 02/26/2024 Start Time: 1030 End Time: 1100  Type of Therapy and Topic:  Group Therapy - Healthy Ways of Asking for Help as a Coping Technique  Participation Level:  Active   Description of Group The focus of this group was to determine what healthy techniques typically are used when asking someone for help. What verbiage would be helpful in coping with various problems and who individually they can call. Patients were guided in becoming aware of the differences between healthy and unhealthy coping techniques. Patients were asked to identify 2-3 healthy people and/or organizations they would like to learn to use more effectively.  Therapeutic Goals Patients learned that coping is what human beings do all day long to deal with various situations in their lives Patients defined and discussed healthy vs unhealthy coping techniques Patients identified their preferred coping techniques and identified someone to whom they could call when needed. Patients determined 2-3 healthy coping skills they would like to become more familiar with and use more often. Patients provided support and ideas to each other   Summary of Patient Progress:  Interpretor present. During group, Mrs. Vanessa Harrison identified her sister Vanessa Harrison at supportive. When asked to identify an emotional picture that described how she felt today, pt reported she felt like a butterfly, skinner, and light. Patient reported she, had not been eating a lot here. Patient proved open to input from peers and feedback from CSW.    Therapeutic Modalities Cognitive Behavioral Therapy Motivational Interviewing  Vanessa Harrison, [place] 02/26/2024  12:29 PM

## 2024-02-26 NOTE — Group Note (Signed)
 Date:  02/26/2024 Time:  3:29 PM  Group Topic/Focus:   Intellectual Wellness    Participation Level:  Did Not Attend  [name] 02/26/2024, 3:29 PM

## 2024-02-26 NOTE — Progress Notes (Incomplete)
 Assessment completed using interpreter id number [id number]  (Sleep Hours) - 12 (Any PRNs that were needed, meds refused, or side effects to meds)- hydroxyzine , trazodone   (Any disturbances and when (visitation, over night)- none (Concerns raised by the patient)- none (SI/HI/AVH)- Endorses AH.  Non threatening and commanding voices.
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
## 2024-02-27 DIAGNOSIS — F319 Bipolar disorder, unspecified: Secondary | ICD-10-CM

## 2024-02-27 MED ORDER — HALOPERIDOL 5 MG PO TABS
10.0000 mg | ORAL_TABLET | Freq: Two times a day (BID) | ORAL | Status: DC
  Administered 2024-02-27 – 2024-02-29 (×5): 10 mg via ORAL
  Filled 2024-02-27 (×5): qty 2

## 2024-02-27 NOTE — BHH Group Notes (Signed)
 Adult Psychoeducational Group Note  Date:  02/27/2024 Time:  11:12 AM  Group Topic/Focus:  Goals Group:   The focus of this group is to help patients establish daily goals to achieve during treatment and discuss how the patient can incorporate goal setting into their daily lives to aide in recovery.  Participation Level:  Did Not Attend  Participation Quality:    Affect:    Cognitive:    Insight:   Engagement in Group:    Modes of Intervention:    Additional Comments:    [name] 02/27/2024, 11:12 AM

## 2024-02-27 NOTE — Plan of Care (Signed)

## 2024-02-27 NOTE — Group Note (Unsigned)
 Date:  02/27/2024 Time:  8:32 PM  Group Topic/Focus:  Wrap-Up Group:   The focus of this group is to help patients review their daily goal of treatment and discuss progress on daily workbooks.     Participation Level:  {BHH PARTICIPATION OZCZO:77735}  Participation Quality:  {BHH PARTICIPATION QUALITY:22265}  Affect:  {BHH AFFECT:22266}  Cognitive:  {BHH COGNITIVE:22267}  Insight: {BHH Insight2:20797}  Engagement in Group:  {BHH ENGAGEMENT IN HMNLE:77731}  Modes of Intervention:  {BHH MODES OF INTERVENTION:22269}  Additional Comments:  ***  Vanessa Harrison 02/27/2024, 8:32 PM

## 2024-02-27 NOTE — BHH Group Notes (Signed)
 Adult Psychoeducational Group Note  Date:  02/27/2024 Time:  3:14 PM  Group Topic/Focus: Intellectual Wellness  Participation Level:  Did Not Attend  Participation Quality:    Affect:    Cognitive:    Insight:   Engagement in Group:    Modes of Intervention:    Additional Comments:    Vanessa Harrison O 02/27/2024, 3:14 PM

## 2024-02-27 NOTE — Progress Notes (Signed)
 [facility] Inpatient Psychiatry Progress Note  Date: 02/27/2024 Patient: Vanessa Harrison MRN: [id number]   ASSESSMENT: Patient is a 49 year old female with a past psychiatric history of bipolar I disorder and anxiety presenting with worsening irritability, intrusive behavior, insomnia with decreased need for sleep, tangentiality, disorganized thoughts, and hyperverbal speech most consistent with acute mania in the context of recent changes to medication regimen. Prior to current manic episode, patient had been stable on regimen of Haldol , lithium , and fluoxetine  for several years. Recently patient was tapered off of Haldol  by outpatient provider as it was thought patient would likely maintain stability of bipolar disorder on lithium  alone. Following discontinuation of haldol , patient appears to have decompensated and had a reemergence of manic symptoms. While patient was at [facility], her lithium  level was found to be subtherapeutic at 0.35, therefore lithium  dose was increased to 900 mg.   Yesterday, patient was restarted on lithium  and Haldol , which she is tolerating well.  Patient appears more organized today, and responds appropriately to questions. Her thought process is notably less tangential, however she continues to have issues with remembering remote events. Per chart review, patient was noted to be responding to auditory hallucinations, although she denies this. We will increase her morning Haldol  dose from 5 mg to 10 mg given ongoing auditory hallucinations. Patient reports much improved sleep since restarting medications. Given improvement in sleep, can likely discontinue Ativan  tomorrow.  PLAN:  Psychiatric Diagnoses and Treatment   # Bipolar I disorder -- Lithium  900 mg nightly             --check level 03/02/24 -- Haldol  10 mg qAM + 10 mg at bedtime -- Ativan  2 mg qhs for insomnia w/ plan to discontinue once sleep is stabilized -- Discontinued benztropine  -  no current or recent symptoms of EPS   PRN's - Trazodone  50 mg at bedtime as needed for insomnia - Atarax  25 mg TID as needed for anxiety - Agitation Protocol: haldol  + ativan  + benadryl    2. Active Medical Issues   #Nicotine withdrawal - Patient does not need nicotine replacement   Other as needed medications  Tylenol  650 mg every 6 hours as needed for pain Mylanta 30 mL every 4 hours as needed for indigestion Milk of magnesia 30 mL daily as needed for constipation   Risk Assessment: Patient continues to require inpatient hospitalization for safety and stabilization of acute mania.  Discharge Planning: Barriers to Discharge: acute mania; medication management Estimated Length of Stay: 5-7 days Predicted Discharge Location: Home  INTERVAL HISTORY: Chart reviewed. No significant events overnight. PRN's in the last 24 hours include milk of magnesia.  Patient interviewed with assistance of in-person Spanish interpreter.  On assessment today, the patient denies any feelings of paranoia that someone is out to harm her. Asked patient about comments to social worker where she stated she did not want to return to live with her husband and would rather live on the streets. Patient denied having any paranoid thoughts about, but does state I do not want to be with him.  Patient reports she slept great last night.  Patient states she does not remember how long she has been taking Haldol . States she was stable will be admitted medically cleared her medications were changed around a lot. She denies SI/HI/AVH. She denies any medication side effects.  Reports overall she feels better. She does have a mild bilateral hand tremor, but states this has been present for a while.   Physical Examination:  Vitals and nursing note reviewed MSK: Normal gait and station Neuro: mild bilateral hand tremor  MENTAL STATUS EXAM:  Appearance: casual, fairly-groomed middle-aged female  Behavior:  appropriate eye contact  Attitude: cooperative, polite  Speech: normal rate, tone, and rhythm ; interruptible  Mood: Fine  Affect: congruent, constricted  Thought Process: mildly circumstantial  Thought Content: no overtly delusional or paranoid thought content  SI/HI: Denies  Perceptions: Denies; not observed responding to internal stimuli  Judgement: Poor  Insight: Poor  Fund of Knowledge: WNL   Lab Results:  No visits with results within 1 Day(s) from this visit.  Latest known visit with results is:  Admission on 02/23/2024, Discharged on 02/24/2024  Component Date Value Ref Range Status   SARS Coronavirus 2 by RT PCR 02/23/2024 NEGATIVE  NEGATIVE Final   WBC 02/23/2024 11.1 (H)  4.0 - 10.5 K/uL Final   RBC 02/23/2024 4.35  3.87 - 5.11 MIL/uL Final   Hemoglobin 02/23/2024 12.5  12.0 - 15.0 g/dL Final   HCT 98/85/7973 38.6  36.0 - 46.0 % Final   MCV 02/23/2024 88.7  80.0 - 100.0 fL Final   MCH 02/23/2024 28.7  26.0 - 34.0 pg Final   MCHC 02/23/2024 32.4  30.0 - 36.0 g/dL Final   RDW 98/85/7973 14.2  11.5 - 15.5 % Final   Platelets 02/23/2024 351  150 - 400 K/uL Final   nRBC 02/23/2024 0.0  0.0 - 0.2 % Final   Neutrophils Relative % 02/23/2024 69  % Final   Neutro Abs 02/23/2024 7.6  1.7 - 7.7 K/uL Final   Lymphocytes Relative 02/23/2024 26  % Final   Lymphs Abs 02/23/2024 2.8  0.7 - 4.0 K/uL Final   Monocytes Relative 02/23/2024 4  % Final   Monocytes Absolute 02/23/2024 0.5  0.1 - 1.0 K/uL Final   Eosinophils Relative 02/23/2024 1  % Final   Eosinophils Absolute 02/23/2024 0.1  0.0 - 0.5 K/uL Final   Basophils Relative 02/23/2024 0  % Final   Basophils Absolute 02/23/2024 0.0  0.0 - 0.1 K/uL Final   Immature Granulocytes 02/23/2024 0  % Final   Abs Immature Granulocytes 02/23/2024 0.04  0.00 - 0.07 K/uL Final   Sodium 02/23/2024 140  135 - 145 mmol/L Final   Potassium 02/23/2024 3.8  3.5 - 5.1 mmol/L Final   Chloride 02/23/2024 103  98 - 111 mmol/L Final   CO2  02/23/2024 20 (L)  22 - 32 mmol/L Final   Glucose, Bld 02/23/2024 91  70 - 99 mg/dL Final   BUN 98/85/7973 11  6 - 20 mg/dL Final   Creatinine, Ser 02/23/2024 0.80  0.44 - 1.00 mg/dL Final   Calcium  02/23/2024 10.3  8.9 - 10.3 mg/dL Final   Total Protein 98/85/7973 7.9  6.5 - 8.1 g/dL Final   Albumin 98/85/7973 4.7  3.5 - 5.0 g/dL Final   AST 98/85/7973 20  15 - 41 U/L Final   ALT 02/23/2024 23  0 - 44 U/L Final   Alkaline Phosphatase 02/23/2024 175 (H)  38 - 126 U/L Final   Total Bilirubin 02/23/2024 0.5  0.0 - 1.2 mg/dL Final   GFR, Estimated 02/23/2024 >60  >60 mL/min Final   Anion gap 02/23/2024 17 (H)  5 - 15 Final   Hgb A1c MFr Bld 02/23/2024 5.8 (H)  4.8 - 5.6 % Final   Mean Plasma Glucose 02/23/2024 119.76  mg/dL Final   Magnesium  02/23/2024 2.0  1.7 - 2.4 mg/dL Final   Alcohol,  Ethyl (B) 02/23/2024 <15  <15 mg/dL Final   Cholesterol 98/85/7973 245 (H)  0 - 200 mg/dL Final   Triglycerides 98/85/7973 122  <150 mg/dL Final   HDL 98/85/7973 36 (L)  >40 mg/dL Final   Total CHOL/HDL Ratio 02/23/2024 6.7  RATIO Final   VLDL 02/23/2024 24  0 - 40 mg/dL Final   LDL Cholesterol 02/23/2024 184 (H)  0 - 99 mg/dL Final   TSH 98/85/7973 1.860  0.350 - 4.500 uIU/mL Final   Prolactin 02/23/2024 92.7 (H)  4.8 - 33.4 ng/mL Final   Preg Test, Ur 02/23/2024 Negative  Negative Final   Color, Urine 02/23/2024 YELLOW  YELLOW Final   APPearance 02/23/2024 CLOUDY (A)  CLEAR Final   Specific Gravity, Urine 02/23/2024 1.015  1.005 - 1.030 Final   pH 02/23/2024 5.0  5.0 - 8.0 Final   Glucose, UA 02/23/2024 NEGATIVE  NEGATIVE mg/dL Final   Hgb urine dipstick 02/23/2024 MODERATE (A)  NEGATIVE Final   Bilirubin Urine 02/23/2024 NEGATIVE  NEGATIVE Final   Ketones, ur 02/23/2024 NEGATIVE  NEGATIVE mg/dL Final   Protein, ur 98/85/7973 NEGATIVE  NEGATIVE mg/dL Final   Nitrite 98/85/7973 NEGATIVE  NEGATIVE Final   Leukocytes,Ua 02/23/2024 SMALL (A)  NEGATIVE Final   RBC / HPF 02/23/2024 0-5  0 - 5 RBC/hpf  Final   WBC, UA 02/23/2024 0-5  0 - 5 WBC/hpf Final   Bacteria, UA 02/23/2024 RARE (A)  NONE SEEN Final   Squamous Epithelial / HPF 02/23/2024 11-20  0 - 5 /HPF Final   Mucus 02/23/2024 PRESENT   Final   Hyaline Casts, UA 02/23/2024 PRESENT   Final   POC Amphetamine UR 02/23/2024 None Detected  NONE DETECTED (Cut Off Level 1000 ng/mL) Final   POC Secobarbital (BAR) 02/23/2024 None Detected  NONE DETECTED (Cut Off Level 300 ng/mL) Final   POC Buprenorphine (BUP) 02/23/2024 None Detected  NONE DETECTED (Cut Off Level 10 ng/mL) Final   POC Oxazepam (BZO) 02/23/2024 None Detected  NONE DETECTED (Cut Off Level 300 ng/mL) Final   POC Cocaine UR 02/23/2024 None Detected  NONE DETECTED (Cut Off Level 300 ng/mL) Final   POC Methamphetamine UR 02/23/2024 None Detected  NONE DETECTED (Cut Off Level 1000 ng/mL) Final   POC Morphine  02/23/2024 None Detected  NONE DETECTED (Cut Off Level 300 ng/mL) Final   POC Methadone UR 02/23/2024 None Detected  NONE DETECTED (Cut Off Level 300 ng/mL) Final   POC Oxycodone  UR 02/23/2024 None Detected  NONE DETECTED (Cut Off Level 100 ng/mL) Final   POC Marijuana UR 02/23/2024 None Detected  NONE DETECTED (Cut Off Level 50 ng/mL) Final   Lithium  Lvl 02/23/2024 0.35 (L)  0.60 - 1.20 mmol/L Final   Vit D, 25-Hydroxy 02/23/2024 7.8 (L)  30 - 100 ng/mL Final     Vitals: Blood pressure (!) 115/57, pulse (!) 106, temperature 97.9 F (36.6 C), temperature source Oral, resp. rate 18, height 5' 4 (1.626 m), weight 78.7 kg, SpO2 98%.    [name] Gravely, MD PGY-1, Psychiatry Residency  02/27/2024, 2:07 PM

## 2024-02-27 NOTE — Group Note (Signed)
 Date:  02/27/2024 Time:  9:09 PM  Group Topic/Focus:  Wrap-Up Group:   The focus of this group is to help patients review their daily goal of treatment and discuss progress on daily workbooks.    Participation Level:  Did Not Attend  Vanessa Harrison 02/27/2024, 9:09 PM

## 2024-02-27 NOTE — BHH Group Notes (Signed)
 Adult Psychoeducational Group Note  Date:  02/27/2024 Time:  3:14 PM  Group Topic/Focus: Emotional Wellness  Participation Level:  Did Not Attend  Participation Quality:    Affect:    Cognitive:    Insight:   Engagement in Group:    Modes of Intervention:    Additional Comments:    Vanessa Harrison O 02/27/2024, 3:14 PM

## 2024-02-27 NOTE — Plan of Care (Signed)

## 2024-02-27 NOTE — Progress Notes (Signed)
" °   02/27/24 0800  Psych Admission Type (Psych Patients Only)  Admission Status Voluntary  Psychosocial Assessment  Patient Complaints Sleep disturbance  Eye Contact Fair  Facial Expression Animated  Affect Anxious  Speech Language other than English;Tangential  Interaction Assertive  Motor Activity Fidgety  Appearance/Hygiene Unremarkable  Behavior Characteristics Cooperative  Mood Pleasant  Aggressive Behavior  Effect No apparent injury  Thought Administrator, Sports thinking  Content Preoccupation;Paranoia  Delusions Paranoid  Perception WDL  Hallucination None reported or observed  Judgment Impaired  Confusion None  Danger to Self  Current suicidal ideation? Denies  Agreement Not to Harm Self Yes  Description of Agreement Notify Staff  Danger to Others  Danger to Others None reported or observed    "

## 2024-02-27 NOTE — BHH Group Notes (Signed)
 Adult Psychoeducational Group Note  Date:  02/27/2024 Time:  10:43 AM  Group Topic/Focus:  Goals Group:   The focus of this group is to help patients establish daily goals to achieve during treatment and discuss how the patient can incorporate goal setting into their daily lives to aide in recovery.  Participation Level:  Did Not Attend  Participation Quality:    Affect:    Cognitive:    Insight:   Engagement in Group:    Modes of Intervention:    Additional Comments:    Annett Berle Hoyer 02/27/2024, 10:43 AM

## 2024-02-27 NOTE — Group Note (Signed)
 Date:  02/27/2024 Time:  6:07 PM  Group Topic/Focus:  Relapse Prevention Planning:   The focus of this group is to define relapse and discuss the need for planning to combat relapse. Natural ways to get dopamine hits as opposed to illicit drugs which cause psychosis.    Participation Level:  Did Not Attend     Juliene CHRISTELLA Huddle 02/27/2024, 6:07 PM

## 2024-02-28 ENCOUNTER — Encounter (HOSPITAL_COMMUNITY): Payer: Self-pay

## 2024-02-28 NOTE — Group Note (Signed)
 Date:  02/28/2024 Time:  2:23 PM  Group Topic/Focus:  Social Work Group    Participation Level:  Active   Shalika Arntz LITTIE [name] 02/28/2024, 2:23 PM

## 2024-02-28 NOTE — Group Note (Signed)
 Recreation Therapy Group Note   Group Topic:Problem Solving  Group Date: 02/28/2024 Start Time: 1032 End Time: 1055 Facilitators: Kruze Atchley-McCall, LRT,CTRS Location: [address] Dayroom   Group Topic: Communication, Team Building, Problem Solving  Goal Area(s) Addresses:  Patient will effectively work with peer towards shared goal.  Patient will identify skills used to make activity successful.  Patient will identify how skills used during activity can be used to reach post d/c goals.   Behavioral Response:   Intervention: STEM Activity  Activity: Straw Bridge. In teams of 3-5, patients were given 15 plastic drinking straws and an equal length of masking tape. Using the materials provided, patients were instructed to build a free standing bridge-like structure to suspend an everyday item (ex: puzzle box) off of the floor or table surface. All materials were required to be used by the team in their design. LRT facilitated post-activity discussion reviewing team process. Patients were encouraged to reflect how the skills used in this activity can be generalized to daily life post discharge.   Education: Pharmacist, Community, Scientist, Physiological, Discharge Planning   Education Outcome: Acknowledges education/In group clarification offered/Needs additional education.    Affect/Mood: N/A   Participation Level: Did not attend    Clinical Observations/Individualized Feedback:      Plan: Continue to engage patient in RT group sessions 2-3x/week.   Attilio Zeitler-McCall, LRT,CTRS 02/28/2024 2:07 PM

## 2024-02-28 NOTE — Progress Notes (Signed)
 Recreation Therapy Notes  INPATIENT RECREATION THERAPY ASSESSMENT  Patient Details Name: Vanessa Harrison MRN: [id number] DOB: [birth year] Today's Date: 02/28/2024       Information Obtained From: Use of Interpreter  Able to Participate in Assessment/Interview: Yes  Patient Presentation: Alert  Reason for Admission (Per Patient): Other (Comments), Aggressive/Threatening (per interpreter; pt stated she could be verbally aggressive to her family at times.)  Patient Stressors: Other (Comment) (pt explained her neice was at her home and her husband came in, didn't want to see the neice, the neice said something to the husband and he froze when he saw her. 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  Patient Main Form of Transportation: Set Designer  Patient Strengths:  sociable, helping out others  Patient Identified Areas of Improvement:  work on being a better person  Patient Goal for Hospitalization:  be on the right medicines to function better  Current SI (including self-harm):  No  Current HI:  No  Current AVH: Yes (talking to somebody at night, they were talking back; when she opened her eyes no one was there)  Staff Intervention Plan: Group Attendance, Collaborate with Interdisciplinary Treatment Team  Consent to Intern Participation: N/A   Zarya Lasseigne-McCall, LRT,CTRS Isaly Fasching A Keyera Hattabaugh-McCall 02/28/2024, 2:42 PM

## 2024-02-28 NOTE — BHH Suicide Risk Assessment (Addendum)
 [facility] INPATIENT:  Family/Significant Other Suicide Prevention Education  Suicide Prevention Education:  Education Completed; Unice Micaela Raddle. (son) [phone],  (name of family member/significant other) has been identified by the patient as the family member/significant other with whom the patient will be residing, and identified as the person(s) who will aid the patient in the event of a mental health crisis (suicidal ideations/suicide attempt).  With written consent from the patient, the family member/significant other has been provided the following suicide prevention education, prior to the and/or following the discharge of the patient.  Son said that he believes patient was admitted to the hospital because her medications were recently adjusted.  Son said that patient lives in [place] but her doctor is in [place] (he wasn't sure who is her provider).  Son said that he can pick up patient in the morning on Mondays, Tuesdays, Fridays, Saturdays and Sundays (on Wednesdays and Thursdays son is available after 3 PM).  Son said that patient lives with her husband.  Son said that patient can become verbally aggressive towards her husband when she is not doing well.  He reported that patient is not aware that she is experiencing symptoms.  Son said that her last hospitalization was 15 years ago.  The suicide prevention education provided includes the following: Suicide risk factors Suicide prevention and interventions National Suicide Hotline telephone number [facility] assessment telephone number [facility] Emergency Assistance 911 [facility] and/or Residential Mobile Crisis Unit telephone number  Request made of family/significant other to: Remove weapons (e.g., guns, rifles, knives), all items previously/currently identified as safety concern.   Remove drugs/medications (over-the-counter, prescriptions, illicit drugs), all items previously/currently identified as a  safety concern.  The family member/significant other verbalizes understanding of the suicide prevention education information provided.  The family member/significant other agrees to remove the items of safety concern listed above.  Lilymae Swiech O Ranada Vigorito LCSW 02/28/2024, 3:15 PM

## 2024-02-28 NOTE — Group Note (Signed)
 Date:  02/28/2024 Time:  12:55 PM  Group Topic/Focus:  Emotional Education:   The focus of this group is to discuss what feelings/emotions are, and how they are experienced.    Participation Level:  Did Not Attend   Vanessa Harrison [name] 02/28/2024, 12:55 PM

## 2024-02-28 NOTE — Progress Notes (Signed)
 Conversation with patient:  Patient said that she doesn't have access to firearms, and there are no firearms in the home.   Vanessa Pettry, LCSW 02/28/2024

## 2024-02-28 NOTE — Group Note (Unsigned)
 Date:  02/28/2024 Time:  9:12 PM  Group Topic/Focus:  Wrap-Up Group:   The focus of this group is to help patients review their daily goal of treatment and discuss progress on daily workbooks.     Participation Level:  {BHH PARTICIPATION OZCZO:77735}  Participation Quality:  {B[facility] PARTICIPATION QUALITY:22265}  Affect:  {BHH AFFECT:22266}  Cognitive:  {B[facility] COGNITIVE:22267}  Insight: {BHH Insight2:20797}  Engagement in Group:  {BHH ENGAGEMENT IN HMNLE:77731}  Modes of Intervention:  {BHH MODES OF INTERVENTION:22269}  Additional Comments:  ***  [name] 02/28/2024, 9:12 PM

## 2024-02-28 NOTE — Progress Notes (Signed)
(  Sleep Hours) -6.75 (Any PRNs that were needed, meds refused, or side effects to meds)- trazodone , hydroxyzine  (Any disturbances and when (visitation, over night)- none reported (Concerns raised by the patient)- none reported (SI/HI/AVH)-Denies SI/HI/VH. Endorses AH

## 2024-02-28 NOTE — Plan of Care (Signed)
  Problem: Education: Goal: Emotional status will improve Outcome: Progressing   Problem: Activity: Goal: Interest or engagement in activities will improve Outcome: Progressing Goal: Sleeping patterns will improve Outcome: Progressing

## 2024-02-28 NOTE — Group Note (Unsigned)
 Date:  02/28/2024 Time:  8:48 PM  Group Topic/Focus:  Wrap-Up Group:   The focus of this group is to help patients review their daily goal of treatment and discuss progress on daily workbooks.     Participation Level:  {BHH PARTICIPATION OZCZO:77735}  Participation Quality:  {BHH PARTICIPATION QUALITY:22265}  Affect:  {BHH AFFECT:22266}  Cognitive:  {B[facility] COGNITIVE:22267}  Insight: {BHH Insight2:20797}  Engagement in Group:  {B[facility] ENGAGEMENT IN HMNLE:77731}  Modes of Intervention:  {BHH MODES OF INTERVENTION:22269}  Additional Comments:  ***  [name] 02/28/2024, 8:48 PM

## 2024-02-28 NOTE — Group Note (Signed)
 LCSW Group Therapy Note   Group Date: 02/28/2024 Start Time: 1300 End Time: 1400   Participation:  patient was present and actively participated in the discussion.  Spanish interpreter was present.   Type of Therapy:  Group Therapy  Topic:  Shining from Within:  Confidence and Self-Love Journey.  Objective:  Promote Self-Awareness and Realistic Self-Talk: Help participants recognize their strengths and replace negative thoughts with truthful, realistic statements to build confidence.  Goals: Increase Confidence: Help participants develop a positive self-image by focusing on their strengths and personal progress. Set Achievable Goals: Guide participants in creating small, realistic goals that foster a sense of accomplishment and build momentum. Enhance Self-Care Practices: Encourage participants to incorporate self-care activities into their routine to support emotional well-being and reinforce confidence.  Summary:  The Shining from Within: Confidence and Self-Love Journey group helped individuals build stronger self-confidence through truthful, realistic self-talk, goal-setting, and self-care practices. Participants recognized their unique strengths, set achievable goals, and nurtured a supportive environment for mutual growth. The group provided practical tools to foster lasting confidence and self-belief, empowering each member to shine from within and embrace their personal power with greater self-assurance.  Therapeutic Modalities:   Cognitive Behavioral Therapy (CBT): Identify and reframe negative self-talk into realistic, positive thoughts Strengths-Based Therapy: Focus on personal strengths, successes, and abilities Psychoeducation: Teach concepts of self-esteem, confidence, and healthy self-talk   Catherene KIDD Tishana Clinkenbeard, LCSWA 02/28/2024  2:22 PM

## 2024-02-28 NOTE — Plan of Care (Signed)
   Problem: Education: Goal: Emotional status will improve Outcome: Progressing Goal: Mental status will improve Outcome: Progressing   Problem: Safety: Goal: Periods of time without injury will increase Outcome: Progressing

## 2024-02-28 NOTE — BH IP Treatment Plan (Signed)
 Interdisciplinary Treatment and Diagnostic Plan Update  02/28/2024 Time of Session: 10:45 am *INTERPRETER WAS PRESENT* Vanessa Harrison MRN: [id number]  Principal Diagnosis: Bipolar affective disorder, currently manic, severe, with psychotic features (HCC)  Secondary Diagnoses: Principal Problem:   Bipolar affective disorder, currently manic, severe, with psychotic features (HCC)   Current Medications:  Current Facility-Administered Medications  Medication Dose Route Frequency Provider Last Rate Last Admin   acetaminophen  (TYLENOL ) tablet 650 mg  650 mg Oral Q6H PRN Nkwenti, Doris, NP       alum & mag hydroxide-simeth (MAALOX/MYLANTA) 200-200-20 MG/5ML suspension 30 mL  30 mL Oral Q4H PRN Nkwenti, Doris, NP       atorvastatin  (LIPITOR) tablet 80 mg  80 mg Oral Daily Mannie [name] SAILOR, MD   80 mg at 02/28/24 9096   cholecalciferol  (VITAMIN D3) 25 MCG (1000 UNIT) tablet 1,000 Units  1,000 Units Oral Daily Chandra Charleston Sherlean Ruth, DO   1,000 Units at 02/28/24 9096   haloperidol  (HALDOL ) tablet 5 mg  5 mg Oral TID PRN Raliegh Marsa RAMAN, DO       And   diphenhydrAMINE  (BENADRYL ) capsule 50 mg  50 mg Oral TID PRN Raliegh Marsa RAMAN, DO       haloperidol  lactate (HALDOL ) injection 5 mg  5 mg Intramuscular TID PRN Raliegh Marsa RAMAN, DO       And   diphenhydrAMINE  (BENADRYL ) injection 50 mg  50 mg Intramuscular TID PRN Raliegh Marsa RAMAN, DO       And   LORazepam  (ATIVAN ) injection 2 mg  2 mg Intramuscular TID PRN Pashayan, Alexander S, DO       haloperidol  (HALDOL ) tablet 10 mg  10 mg Oral BID Stevens, Briana N, MD   10 mg at 02/28/24 9096   hydrOXYzine  (ATARAX ) tablet 50 mg  50 mg Oral Q6H PRN Tex Drilling, NP   50 mg at 02/27/24 2044   lithium  carbonate capsule 900 mg  900 mg Oral QHS Stevens, Briana N, MD   900 mg at 02/27/24 2044   magnesium  hydroxide (MILK OF MAGNESIA) suspension 30 mL  30 mL Oral Daily PRN Tex Drilling, NP   30 mL at 02/27/24 0800   traZODone   (DESYREL ) tablet 100 mg  100 mg Oral QHS PRN Tex Drilling, NP   100 mg at 02/27/24 2044   PTA Medications: Medications Prior to Admission  Medication Sig Dispense Refill Last Dose/Taking   atorvastatin  (LIPITOR) 80 MG tablet Take 80 mg by mouth daily.   02/24/2024   benztropine  (COGENTIN ) 1 MG tablet Take 0.5 mg by mouth 2 (two) times daily.   02/25/2024   haloperidol  (HALDOL ) 5 MG tablet Take 2.5 mg by mouth every 12 (twelve) hours.   02/25/2024   hydrOXYzine  (ATARAX ) 25 MG tablet Take 25 mg by mouth at bedtime as needed (For anxiety or insomnia).   02/23/2024   lithium  carbonate 300 MG capsule Take 900 mg by mouth at bedtime.   02/25/2024   LORazepam  (ATIVAN ) 1 MG tablet Take 1 mg by mouth 4 (four) times daily as needed for anxiety.   Unknown   Vitamin D , Ergocalciferol , (DRISDOL ) 1.25 MG (50000 UNIT) CAPS capsule Take 50,000 Units by mouth every 7 (seven) days.   02/24/2024    Patient Stressors:    Patient Strengths:    Treatment Modalities: Medication Management, Group therapy, Case management,  1 to 1 session with clinician, Psychoeducation, Recreational therapy.   Physician Treatment Plan for Primary Diagnosis: Bipolar affective disorder, currently manic, severe,  with psychotic features (HCC) Long Term Goal(s):     Short Term Goals: Ability to identify changes in lifestyle to reduce recurrence of condition will improve Ability to demonstrate self-control will improve  Medication Management: Evaluate patient's response, side effects, and tolerance of medication regimen.  Therapeutic Interventions: 1 to 1 sessions, Unit Group sessions and Medication administration.  Evaluation of Outcomes: Not Progressing  Physician Treatment Plan for Secondary Diagnosis: Principal Problem:   Bipolar affective disorder, currently manic, severe, with psychotic features (HCC)  Long Term Goal(s):     Short Term Goals: Ability to identify changes in lifestyle to reduce recurrence of condition will  improve Ability to demonstrate self-control will improve     Medication Management: Evaluate patient's response, side effects, and tolerance of medication regimen.  Therapeutic Interventions: 1 to 1 sessions, Unit Group sessions and Medication administration.  Evaluation of Outcomes: Not Progressing   RN Treatment Plan for Primary Diagnosis: Bipolar affective disorder, currently manic, severe, with psychotic features (HCC) Long Term Goal(s): Knowledge of disease and therapeutic regimen to maintain health will improve  Short Term Goals: Ability to remain free from injury will improve, Ability to verbalize frustration and anger appropriately will improve, Ability to demonstrate self-control, Ability to participate in decision making will improve, Ability to verbalize feelings will improve, Ability to disclose and discuss suicidal ideas, Ability to identify and develop effective coping behaviors will improve, and Compliance with prescribed medications will improve  Medication Management: RN will administer medications as ordered by provider, will assess and evaluate patient's response and provide education to patient for prescribed medication. RN will report any adverse and/or side effects to prescribing provider.  Therapeutic Interventions: 1 on 1 counseling sessions, Psychoeducation, Medication administration, Evaluate responses to treatment, Monitor vital signs and CBGs as ordered, Perform/monitor CIWA, COWS, AIMS and Fall Risk screenings as ordered, Perform wound care treatments as ordered.  Evaluation of Outcomes: Not Progressing   LCSW Treatment Plan for Primary Diagnosis: Bipolar affective disorder, currently manic, severe, with psychotic features (HCC) Long Term Goal(s): Safe transition to appropriate next level of care at discharge, Engage patient in therapeutic group addressing interpersonal concerns.  Short Term Goals: Engage patient in aftercare planning with referrals and  resources, Increase social support, Increase ability to appropriately verbalize feelings, Increase emotional regulation, Facilitate acceptance of mental health diagnosis and concerns, Facilitate patient progression through stages of change regarding substance use diagnoses and concerns, Identify triggers associated with mental health/substance abuse issues, and Increase skills for wellness and recovery  Therapeutic Interventions: Assess for all discharge needs, 1 to 1 time with Social worker, Explore available resources and support systems, Assess for adequacy in community support network, Educate family and significant other(s) on suicide prevention, Complete Psychosocial Assessment, Interpersonal group therapy.  Evaluation of Outcomes: Not Progressing   Progress in Treatment: Attending groups: Patient has attended some groups.  Participating in groups: Yes. Taking medication as prescribed: Yes. Toleration medication: Yes. Family/Significant other contact made: No, will contact:   Unice Micaela Raddle. (Son) [phone] Patient understands diagnosis: Yes. Discussing patient identified problems/goals with staff: Yes. Medical problems stabilized or resolved: Yes. Denies suicidal/homicidal ideation: Yes. Issues/concerns per patient self-inventory: No. None reported.  New problem(s) identified: No, Describe:  None identified.  New Short Term/Long Term Goal(s):medication stabilization, elimination of SI thoughts, development of comprehensive mental wellness plan.    Patient Goals: Medication stabilization and therapy  Discharge Plan or Barriers: Patient recently admitted. CSW will continue to follow and assess for appropriate referrals and possible discharge planning.  Reason for Continuation of Hospitalization: Hallucinations Mania Medication stabilization  Estimated Length of Stay: 5-7 days.  Last 3 [place] Suicide Severity Risk Score: Flowsheet Row Ad[facility] (Current) [facility]  [facility] from [facility]/24/2024 in [facility] from 02/23/2024 in [facility]  C-SSRS RISK CATEGORY No Risk No Risk No Risk    Last [facility] 2/9 Scores:    02/24/2024   10:27 PM 09/16/2023   12:57 PM 07/01/2023   12:07 PM  Depression screen PHQ 2/9  Decreased Interest 1 0 1  Down, Depressed, Hopeless 1 0 0  PHQ - 2 Score 2 0 1  Altered sleeping 1  0  Tired, decreased energy 0  1  Change in appetite 0  0  Feeling bad or failure about yourself  1  0  Trouble concentrating 1  2  Moving slowly or fidgety/restless 0  1  Suicidal thoughts 0  0  PHQ-9 Score 5  5   Difficult doing work/chores Very difficult  Not difficult at all     Data saved with a previous flowsheet row definition    Scribe for Treatment Team: Louetta Lame, ISRAEL 02/28/2024 2:06 PM

## 2024-02-28 NOTE — BHH Group Notes (Signed)
 Adult Psychoeducational Group Note  Date:  02/28/2024 Time:  10:42 AM  Group Topic/Focus: Recreation Group   Participation Level:  Did Not Attend   Vanessa Harrison 02/28/2024, 10:42 AM

## 2024-02-28 NOTE — [facility] Group Notes (Signed)
 Adult Psychoeducational Group Note  Date:  02/28/2024 Time:  10:28 AM  Group Topic/Focus: Orentation Group Orientation:   The focus of this group is to educate the patient on the purpose and policies of crisis stabilization and provide a format to answer questions about their admission.  The group details unit policies and expectations of patients while admitted.  Participation Level:  Active  Participation Quality:  Appropriate  Affect:  Appropriate  Cognitive:  Appropriate  Insight: Appropriate  Engagement in Group:  Engaged  Modes of Intervention:  Discussion  Additional Comments:  The patient engaged in group discussion.  Vanessa Harrison 02/28/2024, 10:28 AM

## 2024-02-28 NOTE — Progress Notes (Signed)
(  Sleep Hours) -5  (Any PRNs that were needed, meds refused, or side effects to meds)- Vistaril  50 mg  (Any disturbances and when (visitation, over night)-none  (Concerns raised by the patient)- pt stated she felt drunk this morning when talking to the doctor, pt stated she tried to talk to the doctor but could not get the words out. Pt informed that it may be the Trazodone  , so it was held to night. NP on call D/C Trazodone  and ordered Melatonin 5 mg   (SI/HI/AVH)-denies

## 2024-02-28 NOTE — Group Note (Signed)
 Date:  02/28/2024 Time:  11:03 AM  Group Topic/Focus:  Recreational Therapy    Participation Level:  Did Not Attend   Vanessa Harrison 02/28/2024, 11:03 AM

## 2024-02-28 NOTE — Group Note (Signed)
 Date:  02/28/2024 Time:  9:44 PM  Group Topic/Focus: Wrap-Up Group:   The focus of this group is to help patients review their daily goal of treatment and discuss progress on daily workbooks.     Participation Level:  Active  Participation Quality:  Appropriate  Affect:  Appropriate  Cognitive:  Alert and Appropriate  Insight: Appropriate  Engagement in Group:  Engaged  Modes of Intervention:  Education  Additional Comments:  Patient attended and participated in group tonight.  Gwenn Chillington Dacosta 02/28/2024, 9:44 PM

## 2024-02-28 NOTE — Progress Notes (Signed)
 [facility] Inpatient Psychiatry Progress Note  Date: 02/28/24 Patient: Vanessa Harrison MRN: [id number]  Assessment and Plan: Miaisabella Bacorn is a 49 y.o. female with a past psychiatric history of bipolar I disorder and anxiety presenting with worsening irritability, intrusive behavior, insomnia with decreased need for sleep, tangentiality, disorganized thoughts, and hyperverbal speech most consistent with acute mania in the context of recent changes to medication regimen. Prior to current manic episode, patient had been stable on regimen of Haldol , lithium , and fluoxetine  for several years. Recently patient was tapered off of Haldol  by outpatient provider as it was thought patient would likely maintain stability of bipolar disorder on lithium  alone. Following discontinuation of haldol , patient appears to have decompensated and had a reemergence of manic symptoms. While patient was at [facility], her lithium  level was found to be subtherapeutic at 0.35   1/19 - Patient doesn't appear grossly manic or disorganized today. Pull Li level for tomorrow morning.  # Bipolar affective disorder, currently manic, severe, with psychotic features (HCC) - Lithium  900 mg at bedtime - Haldol  10 mg BID - Li level tomorrow morning  # HLD - atorvastatin  80 mg daily   Risk Assessment - Intermediate due to resolving mania  Discharge Planning Barriers to discharge: Resolving mania/psychosis Estimated length of stay: 4-7 days Predicted Discharge location: home     Interval History and update: No significant events overnight. Patient has been endorsing forgetfulness over the past couple weeks. She reported that this started when she was initially taken off haldol  but she believes it has improved since restarting it, though she continues to experience this. We briefly discussed her recollection of the events leading to admission, to include experiencing paranoia and AH. She  reported that she was not experiencing any AH today and denied paranoia. She endorsed a prior diagnosis of BPAD, but when asked what her symptoms where she stated that she could not recall what they were. She additionally stated that she had a question to ask from earlier but could not recall that either. She did not have any other questions or concerns.       Physical Exam MSK/Neuro - Normal gait and station  Mental Status Exam Appearance - Casually dressed, appropriate hygiene and grooming  Attitude - Calm, polite, not guarded Speech - normal volume, prosody, inflection Mood - Okay Affect - Full Thought Process - Appears linear, GD Thought Content - No delusional TC expressed SI/HI - Denies  Perceptions - Denies AVH; not RIS Judgement/Insight - Fair Fund of knowledge - WNL Language - No impairments      Lab Results:  No visits with results within 1 Day(s) from this visit.  Latest known visit with results is:  Admission on 02/23/2024, Discharged on 02/24/2024  Component Date Value Ref Range Status   SARS Coronavirus 2 by RT PCR 02/23/2024 NEGATIVE  NEGATIVE Final   WBC 02/23/2024 11.1 (H)  4.0 - 10.5 K/uL Final   RBC 02/23/2024 4.35  3.87 - 5.11 MIL/uL Final   Hemoglobin 02/23/2024 12.5  12.0 - 15.0 g/dL Final   HCT 98/85/7973 38.6  36.0 - 46.0 % Final   MCV 02/23/2024 88.7  80.0 - 100.0 fL Final   MCH 02/23/2024 28.7  26.0 - 34.0 pg Final   MCHC 02/23/2024 32.4  30.0 - 36.0 g/dL Final   RDW 98/85/7973 14.2  11.5 - 15.5 % Final   Platelets 02/23/2024 351  150 - 400 K/uL Final   nRBC 02/23/2024 0.0  0.0 - 0.2 %  Final   Neutrophils Relative % 02/23/2024 69  % Final   Neutro Abs 02/23/2024 7.6  1.7 - 7.7 K/uL Final   Lymphocytes Relative 02/23/2024 26  % Final   Lymphs Abs 02/23/2024 2.8  0.7 - 4.0 K/uL Final   Monocytes Relative 02/23/2024 4  % Final   Monocytes Absolute 02/23/2024 0.5  0.1 - 1.0 K/uL Final   Eosinophils Relative 02/23/2024 1  % Final   Eosinophils  Absolute 02/23/2024 0.1  0.0 - 0.5 K/uL Final   Basophils Relative 02/23/2024 0  % Final   Basophils Absolute 02/23/2024 0.0  0.0 - 0.1 K/uL Final   Immature Granulocytes 02/23/2024 0  % Final   Abs Immature Granulocytes 02/23/2024 0.04  0.00 - 0.07 K/uL Final   Sodium 02/23/2024 140  135 - 145 mmol/L Final   Potassium 02/23/2024 3.8  3.5 - 5.1 mmol/L Final   Chloride 02/23/2024 103  98 - 111 mmol/L Final   CO2 02/23/2024 20 (L)  22 - 32 mmol/L Final   Glucose, Bld 02/23/2024 91  70 - 99 mg/dL Final   BUN 98/85/7973 11  6 - 20 mg/dL Final   Creatinine, Ser 02/23/2024 0.80  0.44 - 1.00 mg/dL Final   Calcium  02/23/2024 10.3  8.9 - 10.3 mg/dL Final   Total Protein 98/85/7973 7.9  6.5 - 8.1 g/dL Final   Albumin 98/85/7973 4.7  3.5 - 5.0 g/dL Final   AST 98/85/7973 20  15 - 41 U/L Final   ALT 02/23/2024 23  0 - 44 U/L Final   Alkaline Phosphatase 02/23/2024 175 (H)  38 - 126 U/L Final   Total Bilirubin 02/23/2024 0.5  0.0 - 1.2 mg/dL Final   GFR, Estimated 02/23/2024 >60  >60 mL/min Final   Anion gap 02/23/2024 17 (H)  5 - 15 Final   Hgb A1c MFr Bld 02/23/2024 5.8 (H)  4.8 - 5.6 % Final   Mean Plasma Glucose 02/23/2024 119.76  mg/dL Final   Magnesium  02/23/2024 2.0  1.7 - 2.4 mg/dL Final   Alcohol, Ethyl (B) 02/23/2024 <15  <15 mg/dL Final   Cholesterol 98/85/7973 245 (H)  0 - 200 mg/dL Final   Triglycerides 98/85/7973 122  <150 mg/dL Final   HDL 98/85/7973 36 (L)  >40 mg/dL Final   Total CHOL/HDL Ratio 02/23/2024 6.7  RATIO Final   VLDL 02/23/2024 24  0 - 40 mg/dL Final   LDL Cholesterol 02/23/2024 184 (H)  0 - 99 mg/dL Final   TSH 98/85/7973 1.860  0.350 - 4.500 uIU/mL Final   Prolactin 02/23/2024 92.7 (H)  4.8 - 33.4 ng/mL Final   Preg Test, Ur 02/23/2024 Negative  Negative Final   Color, Urine 02/23/2024 YELLOW  YELLOW Final   APPearance 02/23/2024 CLOUDY (A)  CLEAR Final   Specific Gravity, Urine 02/23/2024 1.015  1.005 - 1.030 Final   pH 02/23/2024 5.0  5.0 - 8.0 Final    Glucose, UA 02/23/2024 NEGATIVE  NEGATIVE mg/dL Final   Hgb urine dipstick 02/23/2024 MODERATE (A)  NEGATIVE Final   Bilirubin Urine 02/23/2024 NEGATIVE  NEGATIVE Final   Ketones, ur 02/23/2024 NEGATIVE  NEGATIVE mg/dL Final   Protein, ur 98/85/7973 NEGATIVE  NEGATIVE mg/dL Final   Nitrite 98/85/7973 NEGATIVE  NEGATIVE Final   Leukocytes,Ua 02/23/2024 SMALL (A)  NEGATIVE Final   RBC / HPF 02/23/2024 0-5  0 - 5 RBC/hpf Final   WBC, UA 02/23/2024 0-5  0 - 5 WBC/hpf Final   Bacteria, UA 02/23/2024 RARE (A)  NONE  SEEN Final   Squamous Epithelial / HPF 02/23/2024 11-20  0 - 5 /HPF Final   Mucus 02/23/2024 PRESENT   Final   Hyaline Casts, UA 02/23/2024 PRESENT   Final   POC Amphetamine UR 02/23/2024 None Detected  NONE DETECTED (Cut Off Level 1000 ng/mL) Final   POC Secobarbital (BAR) 02/23/2024 None Detected  NONE DETECTED (Cut Off Level 300 ng/mL) Final   POC Buprenorphine (BUP) 02/23/2024 None Detected  NONE DETECTED (Cut Off Level 10 ng/mL) Final   POC Oxazepam (BZO) 02/23/2024 None Detected  NONE DETECTED (Cut Off Level 300 ng/mL) Final   POC Cocaine UR 02/23/2024 None Detected  NONE DETECTED (Cut Off Level 300 ng/mL) Final   POC Methamphetamine UR 02/23/2024 None Detected  NONE DETECTED (Cut Off Level 1000 ng/mL) Final   POC Morphine  02/23/2024 None Detected  NONE DETECTED (Cut Off Level 300 ng/mL) Final   POC Methadone UR 02/23/2024 None Detected  NONE DETECTED (Cut Off Level 300 ng/mL) Final   POC Oxycodone  UR 02/23/2024 None Detected  NONE DETECTED (Cut Off Level 100 ng/mL) Final   POC Marijuana UR 02/23/2024 None Detected  NONE DETECTED (Cut Off Level 50 ng/mL) Final   Lithium  Lvl 02/23/2024 0.35 (L)  0.60 - 1.20 mmol/L Final   Vit D, 25-Hydroxy 02/23/2024 7.8 (L)  30 - 100 ng/mL Final     Vitals: Blood pressure 107/62, pulse 85, temperature 97.9 F (36.6 C), temperature source Oral, resp. rate 18, height 5' 4 (1.626 m), weight 78.7 kg, SpO2 98%.    Oliva DELENA Salmon, DO

## 2024-02-29 LAB — LITHIUM LEVEL: Lithium Lvl: 0.94 mmol/L (ref 0.60–1.20)

## 2024-02-29 MED ORDER — HALOPERIDOL 5 MG PO TABS
10.0000 mg | ORAL_TABLET | Freq: Every day | ORAL | Status: DC
  Administered 2024-02-29 – 2024-03-01 (×2): 10 mg via ORAL
  Filled 2024-02-29 (×2): qty 2

## 2024-02-29 MED ORDER — HALOPERIDOL 5 MG PO TABS
5.0000 mg | ORAL_TABLET | Freq: Every day | ORAL | Status: DC
  Administered 2024-03-01 – 2024-03-02 (×2): 5 mg via ORAL
  Filled 2024-02-29: qty 1
  Filled 2024-02-29 (×3): qty 21
  Filled 2024-02-29: qty 1

## 2024-02-29 MED ORDER — MELATONIN 5 MG PO TABS
5.0000 mg | ORAL_TABLET | Freq: Every evening | ORAL | Status: DC | PRN
  Administered 2024-02-29 – 2024-03-01 (×2): 5 mg via ORAL
  Filled 2024-02-29 (×2): qty 1
  Filled 2024-02-29: qty 7

## 2024-02-29 NOTE — Progress Notes (Signed)
 [facility] Inpatient Psychiatry Progress Note  Date: 02/29/24 Patient: Vanessa Harrison MRN: [id number]  Assessment and Plan: Vanessa Harrison is a 49 y.o. female with a past psychiatric history of bipolar I disorder and anxiety presenting with worsening irritability, intrusive behavior, insomnia with decreased need for sleep, tangentiality, disorganized thoughts, and hyperverbal speech most consistent with acute mania in the context of recent changes to medication regimen. Prior to current manic episode, patient had been stable on regimen of Haldol , lithium , and fluoxetine  for several years. Recently patient was tapered off of Haldol  by outpatient provider as it was thought patient would likely maintain stability of bipolar disorder on lithium  alone. Following discontinuation of haldol , patient appears to have decompensated and had a reemergence of manic symptoms. While patient was at [facility], her lithium  level was found to be subtherapeutic at 0.35   1/20 - Patient has not exhibited any overt signs or mania over the past two days. No recurrence of paranoia or HI. She appears to be tolerating her medications well but is slightly sedated by the haloperidol . Plan to decrease morning dose to 5 mg. Li within range. At this point plan for DC on Thursday.   # Bipolar affective disorder, currently manic, severe, with psychotic features (HCC) - Lithium  900 mg at bedtime - Li level 0.94 mmol/L on 1/20 - Haldol  5/10 mg BID - Li level tomorrow morning  # HLD - atorvastatin  80 mg daily   Risk Assessment - low  Discharge Planning Barriers to discharge: Resolving mania/psychosis Estimated length of stay: 2-3 days Predicted Discharge location: home     Interval History and update: No significant events overnight. Patient has been compliant with prescribed medications. Slept well last night. She has been visible and active in the milieu and interacting with staff and  patients appropriately. On assessment she reported that she had a good visit with her son last night and a good conversation with her husband earlier today. We discussed her symptoms and she believes she is about 80% recovered. She reported some mild sedation from the medications but did not otherwise endorse any EPS or other concerns. She feels that she is progressing well and hopeful to discharge home by the end of the week.       Physical Exam MSK/Neuro - Normal gait and station  Mental Status Exam Appearance - Casually dressed, appropriate hygiene and grooming  Attitude - Calm, polite, not guarded Speech - normal volume, prosody, inflection Mood - good Affect - Full Thought Process - LLGD Thought Content - No delusional TC expressed SI/HI - Denies  Perceptions - Denies AVH; not RIS Judgement/Insight - Fair Fund of knowledge - WNL Language - No impairments      Lab Results:  Admission on 02/25/2024  Component Date Value Ref Range Status   Lithium  Lvl 02/29/2024 0.94  0.60 - 1.20 mmol/L Final     Vitals: Blood pressure 100/70, pulse 81, temperature (!) 97.5 F (36.4 C), temperature source Oral, resp. rate 18, height 5' 4 (1.626 m), weight 78.7 kg, SpO2 99%.    Oliva DELENA Salmon, DO

## 2024-02-29 NOTE — Group Note (Signed)
 Date:  02/29/2024 Time:  8:53 PM  Group Topic/Focus:  Wrap-Up Group:   The focus of this group is to help patients review their daily goal of treatment and discuss progress on daily workbooks.    Participation Level:  Active  Participation Quality:  Appropriate and Sharing  Affect:  Appropriate  Cognitive:  Appropriate  Insight: Appropriate  Engagement in Group:  Engaged  Modes of Intervention:  Discussion and Socialization  Additional Comments:  Patient shared one high point and one low point from their day with assistant from interpreter. One high point from patient day, feeling better and receiving a discharge day which has give motivation to do/get better. Patient did not have a low point from today. Patient rated her day a 9 out of 10.   Vanessa Harrison 02/29/2024, 8:53 PM

## 2024-02-29 NOTE — BHH Group Notes (Signed)
 Adult Psychoeducational Group Note  Date:  02/29/2024 Time:  1:23 PM  Group Topic/Focus:  Goals Group:   The focus of this group is to help patients establish daily goals to achieve during treatment and discuss how the patient can incorporate goal setting into their daily lives to aide in recovery. Orientation:   The focus of this group is to educate the patient on the purpose and policies of crisis stabilization and provide a format to answer questions about their admission.  The group details unit policies and expectations of patients while admitted.  Participation Level:  Did Not Attend  Participation Quality:    Affect:    Cognitive:    Insight:   Engagement in Group:    Modes of Intervention:    Additional Comments:    Vanessa Harrison O 02/29/2024, 1:23 PM

## 2024-02-29 NOTE — BHH Group Notes (Signed)
 Adult Psychoeducational Group Note  Date:  02/29/2024 Time:  4:38 PM  Group Topic/Focus: Pharmacy Group  Participation Level:  Active  Participation Quality:    Affect:    Cognitive:    Insight:   Engagement in Group:    Modes of Intervention:    Additional Comments:    Sterling Ucci O 02/29/2024, 4:38 PM

## 2024-02-29 NOTE — Plan of Care (Signed)
   Problem: Activity: Goal: Interest or engagement in activities will improve Outcome: Progressing   Problem: Coping: Goal: Ability to demonstrate self-control will improve Outcome: Progressing

## 2024-02-29 NOTE — Progress Notes (Signed)
(  Sleep Hours) -7.5  (Any PRNs that were needed, meds refused, or side effects to meds)- Vistaril  50 mg  (Any disturbances and when (visitation, over night)-none  (Concerns raised by the patient)- none  (SI/HI/AVH)-denies

## 2024-02-29 NOTE — BHH Group Notes (Signed)
 Adult Psychoeducational Group Note  Date:  02/29/2024 Time:  1:24 PM  Group Topic/Focus: Recreation Therapy  Participation Level:  Active  Participation Quality:    Affect:    Cognitive:    Insight:   Engagement in Group:    Modes of Intervention:    Additional Comments:    Chevella Pearce O 02/29/2024, 1:24 PM

## 2024-02-29 NOTE — Group Note (Signed)
 Recreation Therapy Group Note   Group Topic:Team Building  Group Date: 02/29/2024 Start Time: 1042 End Time: 1109 Facilitators: Saadiya Wilfong-McCall, LRT,CTRS Location: [address] Dayroom   Group Topic: Communication, Team Building, Problem Solving  Goal Area(s) Addresses:  Patient will effectively work with peer towards shared goal.  Patient will identify skills used to make activity successful.  Patient will identify how skills used during activity can be used to reach post d/c goals.   Behavioral Response: Engaged, Attentive, Appropriate   Intervention: Teambuilding Activity  Activity: Lily Pad. Working as a administrator, civil service, patients were asked to use colored discs to get the entire team from one end of the hall way to the other. Patients were only allowed to move down and back the hallway by stepping on the discs, patient team was provided 1 additional disc to assist with them completing task.    Education: Pharmacist, Community, Scientist, Physiological, Discharge Planning   Education Outcome: Acknowledges education.    Affect/Mood: Appropriate   Participation Level: Engaged   Participation Quality: Independent   Behavior: Appropriate   Speech/Thought Process: Focused   Insight: Good   Judgement: Good   Modes of Intervention: Team-building   Patient Response to Interventions:  Engaged   Education Outcome:  In group clarification offered    Clinical Observations/Individualized Feedback: Pt was bright and engaged during activity. Pt was focused and able to follow along with the structure her peers came up with to complete the activity.     Plan: Continue to engage patient in RT group sessions 2-3x/week.   Benjamen Koelling-McCall, LRT,CTRS 02/29/2024 2:22 PM

## 2024-02-29 NOTE — Plan of Care (Signed)
   Problem: Education: Goal: Emotional status will improve Outcome: Progressing Goal: Mental status will improve Outcome: Progressing   Problem: Activity: Goal: Interest or engagement in activities will improve Outcome: Progressing Goal: Sleeping patterns will improve Outcome: Progressing

## 2024-02-29 NOTE — BHH Group Notes (Signed)
 Adult Psychoeducational Group Note  Date:  02/29/2024 Time:  4:37 PM  Group Topic/Focus: Emotional Therapy  Participation Level:  Active  Participation Quality:    Affect:    Cognitive:    Insight:   Engagement in Group:    Modes of Intervention:    Additional Comments:    Jamelle Cassondra KIDD 02/29/2024, 4:37 PM

## 2024-03-01 NOTE — Progress Notes (Signed)
 Spirituality group facilitated by Elia Rockie Sofia, BCC.  Group Description: Group focused on topic of hope. Patients participated in facilitated discussion around topic, connecting with one another around experiences and definitions for hope. Group members engaged with visual explorer photos, reflecting on what hope looks like for them today. Group engaged in discussion around how their definitions of hope are present today in hospital.  Modalities: Psycho-social ed, Adlerian, Narrative, MI  Patient Progress: Vanessa Harrison attended group and actively engaged and participated in group conversation and activities.  Comments demonstrated good insight and contributed positively to the group conversation.

## 2024-03-01 NOTE — Group Note (Signed)
 Date:  03/01/2024 Time:  6:24 PM  Group Topic/Focus:  Making Healthy Choices:   The focus of this group is to help patients identify negative/unhealthy choices they were using prior to admission and identify positive/healthier coping strategies to replace them upon discharge. Things that we can do to improve overall brain health.      Participation Level:  Did Not Attend Participation Level:  Active  Participation Quality:  Attentive  Affect:  Appropriate  Cognitive:  Appropriate  Insight: Appropriate  Engagement in Group:  Engaged  Modes of Intervention:  Discussion and Education  Additional Comments:    Vanessa Harrison 03/01/2024, 6:24 PM

## 2024-03-01 NOTE — BHH Group Notes (Signed)
 Adult Psychoeducational Group Note  Date:  03/01/2024 Time:  9:06 PM  Group Topic/Focus:  Wrap-Up Group:   The focus of this group is to help patients review their daily goal of treatment and discuss progress on daily workbooks.  Participation Level:  Active  Participation Quality:  Appropriate  Affect:  Appropriate  Cognitive:  Appropriate  Insight: Appropriate  Engagement in Group:  Engaged  Modes of Intervention:  Discussion  Additional Comments:  [name] was a 10. Goal for the day to keep getting better Coping skills taking meds trusting God. Favorite part of the day Seeing husband during visitation  [name] 03/01/2024, 9:06 PM

## 2024-03-01 NOTE — Progress Notes (Signed)
 [facility] Inpatient Psychiatry Progress Note  Date: 03/01/24 Patient: Vanessa Harrison MRN: [id number]  Assessment and Plan: Lexxi Koslow is a 49 y.o. female with a past psychiatric history of bipolar I disorder and anxiety presenting with worsening irritability, intrusive behavior, insomnia with decreased need for sleep, tangentiality, disorganized thoughts, and hyperverbal speech most consistent with acute mania in the context of recent changes to medication regimen. Prior to current manic episode, patient had been stable on regimen of Haldol , lithium , and fluoxetine  for several years. Recently patient was tapered off of Haldol  by outpatient provider as it was thought patient would likely maintain stability of bipolar disorder on lithium  alone. Following discontinuation of haldol , patient appears to have decompensated and had a reemergence of manic symptoms. While patient was at [facility], her lithium  level was found to be subtherapeutic at 0.35   1/21 - Patient demonstrates sustained improvement. Plan for discharge home tomorrow.   # Bipolar affective disorder, currently manic, severe, with psychotic features (HCC) - Lithium  900 mg at bedtime - Li level 0.94 mmol/L on 1/20 - Haldol  5/10 mg BID - Li level tomorrow morning  # HLD - atorvastatin  80 mg daily   Risk Assessment - low  Discharge Planning Estimated length of stay: tomorrow Predicted Discharge location: home     Interval History and update: No significant events overnight. Patient remains compliant with medications. She reported sleeping well last night and states that she is still somewhat sedated this morning with the 5 mg dose of haloperidol , but less so compared to yesterday. She denied any AVH, paranoid ideation, SI, or HI. She has been visible and active in the milieu and exhibiting appropriate behavior. We discussed plans to discharge home tomorrow and she feels comfortable doing so  without reservation. No medical issues  reported.       Physical Exam MSK/Neuro - Normal gait and station  Mental Status Exam Appearance - Casually dressed, appropriate hygiene and grooming  Attitude - Calm, polite, not guarded Speech - normal volume, prosody, inflection Mood - good Affect - Full Thought Process - LLGD Thought Content - No delusional TC expressed SI/HI - Denies  Perceptions - Denies AVH; not RIS Judgement/Insight - Fair Fund of knowledge - WNL Language - No impairments      Lab Results:  Admission on 02/25/2024  Component Date Value Ref Range Status   Lithium  Lvl 02/29/2024 0.94  0.60 - 1.20 mmol/L Final     Vitals: Blood pressure 127/61, pulse 75, temperature 98 F (36.7 C), temperature source Oral, resp. rate 18, height 5' 4 (1.626 m), weight 78.7 kg, SpO2 100%.    Oliva DELENA Salmon, DO

## 2024-03-01 NOTE — Plan of Care (Signed)
   Problem: Education: Goal: Emotional status will improve Outcome: Progressing Goal: Mental status will improve Outcome: Progressing

## 2024-03-01 NOTE — Group Note (Signed)
 Date:  03/01/2024 Time:  12:02 PM  Group Topic/Focus:  Making Healthy Choices:   The focus of this group is to help patients identify negative/unhealthy choices they were using prior to admission and identify positive/healthier coping strategies to replace them upon discharge. This group was about physical wellness and moving our bodies around for cardio.     Participation Level:  Did Not Attend   Meg FORBES Ellen 03/01/2024, 12:02 PM

## 2024-03-01 NOTE — Group Note (Signed)
 Date:  03/01/2024 Time:  6:40 PM  Group Topic/Focus:   Overcoming Stress:   The focus of this group is to introduce and practice Progressive Muscle Relaxation, a technique that helps reduce physical tension and alleviate stress and anger.    Participation Level:  Did Not Attend    Vanessa Harrison Mars 03/01/2024, 6:40 PM

## 2024-03-01 NOTE — Group Note (Signed)
 Date:  03/01/2024 Time:  6:49 PM  Group Topic/Focus:   Spiritual Wellness (Chaplain)   Pt did attend spiritual wellness group   Vanessa Harrison Mars 03/01/2024, 6:49 PM

## 2024-03-01 NOTE — Group Note (Signed)
 Date:  03/01/2024 Time:  9:32 AM  Group Topic/Focus:  Goals Group:   The focus of this group is to help patients establish daily goals to achieve during treatment and discuss how the patient can incorporate goal setting into their daily lives to aide in recovery. Orientation:   The focus of this group is to educate the patient on the purpose and policies of crisis stabilization and provide a format to answer questions about their admission.  The group details unit policies and expectations of patients while admitted.    Participation Level:  Active  Participation Quality:  Appropriate  Affect:  Appropriate  Cognitive:  Appropriate  Insight: Appropriate  Engagement in Group:  Engaged  Modes of Intervention:  Education and Orientation  Additional Comments:  The patient attended group, during which unit rules and expectations were reviewed. The group also discussed personal interests to promote bonding among patients; the patient shared an interest in volleyball. Additionally, the chain of command was reviewed to ensure patients feel heard and supported. The patients care team was discussed, along with expectations regarding their treatment and care.  Virginia Curl E Keylon Labelle 03/01/2024, 9:32 AM

## 2024-03-01 NOTE — Group Note (Signed)
 Date:  03/01/2024 Time:  1:26 PM  Group Topic/Focus:   Pharmacy Group   Pt did attend pharmacy group  Sho Salguero D Tramar Brueckner 03/01/2024, 1:26 PM

## 2024-03-01 NOTE — Group Note (Signed)
 Date:  03/01/2024 Time:  6:36 PM  Group Topic/Focus:  Goals Group:   The focus of this group is to help patients establish daily goals to achieve during treatment and discuss how the patient can incorporate goal setting into their daily lives to aide in recovery. Orientation:   The focus of this group is to educate the patient on the purpose and policies of crisis stabilization and provide a format to answer questions about their admission.  The group details unit policies and expectations of patients while admitted.    Participation Level:  Did Not Attend   Vanessa Harrison Mars 03/01/2024, 6:36 PM

## 2024-03-01 NOTE — [facility] Suicide Risk Assessment (Signed)
 [facility] INPATIENT:  Family/Significant Other Suicide Prevention Education  Suicide Prevention Education:  Education Completed; Unice Harlon Rasp (husband) [phone],  (name of family member/significant other) has been identified by the patient as the family member/significant other with whom the patient will be residing, and identified as the person(s) who will aid the patient in the event of a mental health crisis (suicidal ideations/suicide attempt).  With written consent from the patient, the family member/significant other has been provided the following suicide prevention education, prior to the and/or following the discharge of the patient.  Husband said that he will pick up patient on Thursday, 1/22 at 10:15 AM.  Husband said that patient doesn't have access to firearms, and there are no firearms in the home.  The suicide prevention education provided includes the following: Suicide risk factors Suicide prevention and interventions National Suicide Hotline telephone number [facility] assessment telephone number [facility] Emergency Assistance 911 [facility] and/or Residential Mobile Crisis Unit telephone number  Request made of family/significant other to: Remove weapons (e.g., guns, rifles, knives), all items previously/currently identified as safety concern.   Remove drugs/medications (over-the-counter, prescriptions, illicit drugs), all items previously/currently identified as a safety concern.  The family member/significant other verbalizes understanding of the suicide prevention education information provided.  The family member/significant other agrees to remove the items of safety concern listed above.  Vanecia Limpert O Shunsuke Granzow, LCSW 03/01/2024, 4:22 PM

## 2024-03-01 NOTE — Group Note (Signed)
 Date:  03/01/2024 Time:  10:52 AM  Group Topic/Focus:  Early Warning Signs:   The focus of this group is to help patients identify signs or symptoms they exhibit before slipping into an unhealthy state or crisis. Emotional Education:   The focus of this group is to discuss what feelings/emotions are, and how they are experienced. Healthy Communication:   The focus of this group is to discuss communication, barriers to communication, as well as healthy ways to communicate with others.    Participation Level:  Did Not Attend    Vanessa Harrison Ellen 03/01/2024, 10:52 AM

## 2024-03-02 ENCOUNTER — Telehealth (HOSPITAL_COMMUNITY): Payer: Self-pay | Admitting: Student in an Organized Health Care Education/Training Program

## 2024-03-02 ENCOUNTER — Encounter (HOSPITAL_COMMUNITY): Payer: Self-pay | Admitting: Student in an Organized Health Care Education/Training Program

## 2024-03-02 DIAGNOSIS — F312 Bipolar disorder, current episode manic severe with psychotic features: Principal | ICD-10-CM

## 2024-03-02 MED ORDER — HALOPERIDOL 10 MG PO TABS: 10.0000 mg | ORAL_TABLET | Freq: Every day | ORAL | 0 refills | Status: AC

## 2024-03-02 MED ORDER — HYDROXYZINE HCL 25 MG PO TABS
25.0000 mg | ORAL_TABLET | Freq: Every evening | ORAL | Status: DC | PRN
  Filled 2024-03-02: qty 7

## 2024-03-02 MED ORDER — HALOPERIDOL 5 MG PO TABS: 5.0000 mg | ORAL_TABLET | Freq: Every day | ORAL | 0 refills | Status: AC

## 2024-03-02 NOTE — Telephone Encounter (Addendum)
 I was unable to contact the patient this morning as she was currently hospitalized at [facility] and discharged this afternoon.  Contacted the patient's husband later in the day, Vanessa Harrison, at [phone], who confirms the patient's psychiatric decompensation documented in her chart.  He reports that she is home now resting and would like for this provider to reach out next Monday 1/26 as she had expressed preference to switch to outpatient provider closer to her home.  I encouraged the patient's husband to reach out to our outpatient offices should he have any further questions.

## 2024-03-02 NOTE — Group Note (Signed)
 Date:  03/02/2024 Time:  9:14 AM  Group Topic/Focus:  Goals Group:   The focus of this group is to help patients establish daily goals to achieve during treatment and discuss how the patient can incorporate goal setting into their daily lives to aide in recovery. Orientation:   The focus of this group is to educate the patient on the purpose and policies of crisis stabilization and provide a format to answer questions about their admission.  The group details unit policies and expectations of patients while admitted.    Participation Level:  Did Not Attend   Vanessa Harrison 03/02/2024, 9:14 AM

## 2024-03-02 NOTE — Progress Notes (Signed)
(  Sleep Hours) - 7.5 (Any PRNs that were needed, meds refused, or side effects to meds)- PRN Hydroxyzine  and PRN Melatonin (Any disturbances and when (visitation, over night)- None (Concerns raised by the patient)- None (SI/HI/AVH)- Denies. Contracts for safety

## 2024-03-02 NOTE — Telephone Encounter (Signed)
 This encounter was created in error - please disregard.

## 2024-03-02 NOTE — Hospital Course (Signed)
 Psychiatric Discharge Summary Patient Name: Vanessa Harrison MRN: [id number] DOB: 1975/06/28 Age: 49 Sex: Female Primary Language: Spanish (interpreter required)  Facility: [facility] Admission Date: February 25, 2024 Discharge Date: March 02, 2024 Attending Psychiatrist: Oliva A. Prentis, DO Admitting Service: Inpatient Psychiatry  Reason for Admission The patient was admitted voluntarily for acute mania with psychotic features in the setting of recent medication changes. She presented with worsening irritability, intrusive behavior, decreased need for sleep, hyperverbal speech, tangential and disorganized thought processes, paranoia, and impaired functioning. Symptoms emerged after outpatient taper and discontinuation of haloperidol , with a concurrent subtherapeutic lithium  level (0.35 mmol/L).  Psychiatric Diagnoses at Discharge Bipolar I disorder, current episode manic, severe, with psychotic features (HCC) Anxiety disorder (by history) Medical Diagnoses Hyperlipidemia Vitamin D  deficiency Hospital Course On admission, the patient demonstrated florid manic symptoms with psychotic features, poor insight, impaired judgment, and significant disorganization. Lithium  was resumed and titrated to 900 mg nightly. Haloperidol  was restarted and up-titrated to address psychosis and mania, with subsequent dose adjustment due to sedation. Lorazepam  was used short-term for sleep and discontinued once sleep normalized. Benztropine  was discontinued due to lack of extrapyramidal symptoms.  Over the course of hospitalization, the patient showed progressive improvement in mood, sleep, thought organization, and behavioral control. Psychotic symptoms resolved, and no further auditory hallucinations, paranoia, suicidal ideation, or homicidal ideation were reported. Lithium  levels normalized (0.94 mmol/L on February 29, 2024). By discharge, the patient was calm, cooperative,  organized, future-oriented, and functioning appropriately on the unit.  Family was engaged in discharge planning, including suicide prevention education. The patient expressed readiness and comfort with discharge home.  Mental Status Exam at Discharge Appearance: Casually dressed, appropriate hygiene Behavior: Calm, cooperative Speech: Normal rate, volume, and prosody Mood: Good Affect: Full, congruent Thought Process: Logical, linear, goal-directed Thought Content: No delusions or paranoia Perception: Denies AVH SI/HI: Denies Insight/Judgment: Fair Cognition: Grossly intact Discharge Medications Lithium  carbonate 900 mg PO nightly Haloperidol  5 mg PO every morning and 10 mg PO nightly Atorvastatin  80 mg PO daily Vitamin D  (ergocalciferol ) 50,000 units PO weekly PRN Medications:  Hydroxyzine  25 mg PO TID PRN anxiety Trazodone  50 mg PO nightly PRN insomnia Pertinent Labs Lithium  level: 0.94 mmol/L (February 29, 2024) Renal function: WNL TSH: WNL Prolactin: Elevated (chronic, likely antipsychotic-related) Risk Assessment at Discharge Suicide Risk: Low Homicide Risk: Low The patient denies SI/HI, has improved insight, is medication compliant, and has family support.  Discharge Disposition Discharged to: Home Living Situation: With husband Condition at Discharge: Stable Discharge Instructions and Follow-Up Continue medications as prescribed. Obtain outpatient lithium  level monitoring as scheduled. Follow up with outpatient psychiatry (previously established with Dr. Marlo Masson). Engage in outpatient psychiatric care for medication management and monitoring. Family instructed on warning signs of relapse and crisis resources. Prepared by: Marc A. Prentis, DO Attending Psychiatrist  Date: March 02, 2024

## 2024-03-02 NOTE — Progress Notes (Signed)
Pt discharged to lobby. Pt was stable and appreciative at that time. All papers, samples, and prescriptions were given and valuables returned. Verbal understanding expressed. Denies SI/HI and A/VH. Pt given opportunity to express concerns and ask questions. 

## 2024-03-02 NOTE — Group Note (Signed)
 Date:  03/02/2024 Time:  10:37 AM  Group Topic/Focus:  Making Healthy Choices:   The focus of this group is to help patients identify negative/unhealthy choices they were using prior to admission and identify positive/healthier coping strategies to replace them upon discharge. Wellness Toolbox:   The focus of this group is to discuss various aspects of wellness, balancing those aspects and exploring ways to increase the ability to experience wellness.  Patients will create a wellness toolbox for use upon discharge. Nutrition: How to make healthier choices.     Participation Level:  Active  Yelena Metzer 03/02/2024, 10:37 AM

## 2024-03-02 NOTE — Progress Notes (Addendum)
" °  [facility] Adult Case Management Discharge Plan :  Will you be returning to the same living situation after discharge:  Yes,  patient lives with her husband, Unice Harlon Rasp At discharge, do you have transportation home?: Yes,  patient's husband, Unice Harlon Rasp will pick her up at 10:15 AM Do you have the ability to pay for your medications: No.  Patient will receive medication samples.  Release of information consent forms completed and in the chart;  Patient's signature needed at discharge.  Patient to Follow up at:  Follow-up Information     [facility]. Go on 03/21/2024.   Specialty: Behavioral Health Why: You may go to this provider on 03/21/24 at 7:00 am for an assessment, to obtain medication management services.  You may also go Monday through Friday, arrive by 7:00 am for assessments. Contact information: [address]   [phone]        [facility]. Go on 03/14/2024.   Specialty: Professional Counselor Why: You may go to this provider on 03/14/24 at 9:00 am to register for services.  At this time, you will be scheduled for a clinical assessment, in order to obtain a therapy appointment.   You may also go Monday thru Friday, from 9 am to 1 pm to register for services. Contact information: [address] [place]  [address] [place] [address] [phone]         [facility]. Go on 03/07/2024.   Why: Please go to this provider on 03/07/24 at 8:30 am for an assessment, to establish care for therapy and medication management services. Contact information: [address] [place], [place] [address] Hours: Mon-Fri: 8AM to 5PM  Phone: [phone] Fax: [phone]                Next level of care provider has access to [facility] Link:no  Safety Planning and Suicide Prevention discussed: Yes,  Unice Harlon Rasp (husband) [phone] and Unice Harlon Raddle. (son) [phone]  Has patient been referred to the Quitline?: Patient does not use tobacco/nicotine products  Patient has been referred for addiction treatment: No known substance use disorder.   Sheila Ocasio O Hamish Banks, LCSW 03/02/2024, 10:31 AM "

## 2024-03-02 NOTE — [facility] Suicide Risk Assessment (Signed)
 [facility] Discharge Suicide Risk Assessment   Principal Problem: Bipolar affective disorder, currently manic, severe, with psychotic features (HCC) Discharge Diagnoses: Principal Problem:   Bipolar affective disorder, currently manic, severe, with psychotic features (HCC)   Demographic Factors:  NA  Loss Factors: NA  Historical Factors: Impulsivity  Risk Reduction Factors:   Sense of responsibility to family, Religious beliefs about death, Living with another person, especially a relative, Positive social support, and Positive therapeutic relationship  Continued Clinical Symptoms:  Bipolar disorder  Cognitive Features That Contribute To Risk:  None    Suicide Risk:  Mild:  Suicidal ideation of limited frequency, intensity, duration, and specificity.  There are no identifiable plans, no associated intent, mild dysphoria and related symptoms, good self-control (both objective and subjective assessment), few other risk factors, and identifiable protective factors, including available and accessible social support.   Follow-up Information     [facility]. Go on 03/21/2024.   Specialty: Behavioral Health Why: Please go to this provider on 03/21/24 at 7:00 am for an assessment, to obtain medication management services.  You may also go Monday through Friday, arrive by 7:00 am for assessments. Contact information: [address] [place]  [phone]        [facility] Go on 03/14/2024.   Specialty: Professional Counselor Why: Please go to this provider on 03/14/24 at 9:00 am to register for services.  At this time, you will be scheduled for a clinical assessment, in order to obtain a therapy appointment.   You may also go Monday thru Friday, from 9 am to 1 pm to register for services. Contact information: [address] [place] [address] [phone]                 Vanessa DELENA Salmon,  DO 03/02/2024, 9:02 AM

## 2024-03-02 NOTE — Plan of Care (Signed)
   Problem: Safety: Goal: Periods of time without injury will increase Outcome: Progressing
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
## 2024-03-04 NOTE — Discharge Summary (Signed)
 " Physician Discharge Summary Note  Patient:  Vanessa Harrison is an 49 y.o., female MRN:  [id number] DOB:  1976/02/02 Patient phone:  There is no home phone number on file.  Patient address:   [address] [place] [address],  Total Time spent with patient: 1 hour  Date of Admission:  02/25/2024 Date of Discharge: 03/02/2024  Reason for Admission:  The patient was admitted voluntarily for acute mania with psychotic features in the setting of recent medication changes, including discontinuation of haloperidol  and a subtherapeutic lithium  level (0.35 mmol/L). She presented with irritability, insomnia with decreased need for sleep, intrusive behavior, disorganized and tangential thought process, hyperverbal speech, paranoia, and auditory hallucinations, resulting in impaired functioning and safety concerns.   Principal Problem: Bipolar affective disorder, currently manic, severe, with psychotic features [facility]) Discharge Diagnoses: Bipolar 1 Disorder, MRE manic  Past Psychiatric History:  Current psychiatrist: Dr. Marlo Masson Current therapist: None Previous psychiatric diagnoses: bipolar I, anxiety Psychiatric Medications:             -Home Meds: lithium , haldol , benztropine , ativan , hydroxyzine              -Past Med Trials: Patient unable to recall Psychiatric hospitalization(s): reports prior hospitalization when she was living in [place] Psychotherapy history: None Neuromodulation history: None History of suicide (obtained from HPI): No History of homicide or aggression (obtained in HPI): During manic episodes, patient can become aggressive towards others and family  Past Medical History:  Past Medical History:  Diagnosis Date   Anxiety    Bipolar 2 disorder (HCC)    Discharge from right nipple 11/19/2017   Hypercholesteremia     Past Surgical History:  Procedure Laterality Date   APPENDECTOMY  09/2012   lap appy   BREAST LUMPECTOMY Right 11/19/2017    Procedure: RIGHT BREAST LUMPECTOMY SUBAREOLAR;  Surgeon: Gail Favorite, MD;  Location: [facility] OR;  Service: General;  Laterality: Right;   CESAREAN SECTION     LAPAROSCOPIC APPENDECTOMY N/A 09/24/2012   Procedure: APPENDECTOMY LAPAROSCOPIC;  Surgeon: Lynwood MALVA Pina, MD;  Location: [facility] OR;  Service: General;  Laterality: N/A;      Hospital Course:  On admission, the patient demonstrated prominent manic and psychotic symptoms with poor insight. She had a known history of long-term stability on lithium  and haloperidol , with recent decompensation following haloperidol  taper by her outpatient provider in an effort to reduce polypharmacy.  - Lithium  was continued and adjuted to 900 mg nightly. Level on 1/20 was 0.94 mmol/L. - Haloperidol  was restarted and titrated initially to 10 mg BID, and later reduced to 5/10 mg BID as the acute mania resolved.  - Lorazepam  was used short-term for sleep and then discontinued once sleep normalized. - Vitamin D  deficiency was identified and treated.  The patient showed progressive improvement in mood, thought organization, sleep, and behavior, with resolution of hallucinations and paranoia. No suicidal or homicidal ideation was endorsed during the latter part of hospitalization. By discharge, she was calm, cooperative, future-oriented, medication compliant, and appropriate in the milieu. She was not exhibiting nor endorsing any significant side effects from her prescribed medications. No significant medical or behavioral events occurred during this hospitalization.  Mental Status Exam: Appearance: Casually dressed, good hygiene Behavior: Calm, cooperative Speech: Normal rate, volume, and prosody Mood: Good Affect: Full, congruent Thought Process: Logical, linear, goal-directed Thought Content: No delusions, paranoia, or preoccupations Perception: Denies auditory or visual hallucinations SI/HI: Denies Insight/Judgment: Fair Cognition: Grossly  intact   Physical Exam Vitals and nursing note  reviewed.  Constitutional:      Appearance: Normal appearance. She is normal weight.  Pulmonary:     Effort: Pulmonary effort is normal.  Neurological:     General: No focal deficit present.     Mental Status: She is alert and oriented to person, place, and time.    Review of Systems  Constitutional: Negative.   Respiratory: Negative.    Cardiovascular: Negative.    Blood pressure 108/66, pulse 67, temperature (!) 97.5 F (36.4 C), temperature source Oral, resp. rate 18, height 5' 4 (1.626 m), weight 78.7 kg, SpO2 100%. Body mass index is 29.76 kg/m.   Tobacco Use History[1] Tobacco Cessation:  N/A, patient does not currently use tobacco products   Blood Alcohol level:  Lab Results  Component Value Date   [facility] <15 02/23/2024    Metabolic Disorder Labs:  Lab Results  Component Value Date   HGBA1C 5.8 (H) 02/23/2024   MPG 119.76 02/23/2024   Lab Results  Component Value Date   PROLACTIN 92.7 (H) 02/23/2024   PROLACTIN 61.4 (H) 07/01/2023   Lab Results  Component Value Date   CHOL 245 (H) 02/23/2024   TRIG 122 02/23/2024   HDL 36 (L) 02/23/2024   CHOLHDL 6.7 02/23/2024   VLDL 24 02/23/2024   LDLCALC 184 (H) 02/23/2024   LDLCALC 159 (H) 07/01/2023    See Psychiatric Specialty Exam and Suicide Risk Assessment completed by Attending Physician prior to discharge.  Discharge destination:  Home  Is patient on multiple antipsychotic therapies at discharge:  No     Allergies as of 03/02/2024   No Known Allergies      Medication List     PAUSE taking these medications      Indication  benztropine  1 MG tablet Wait to take this until your doctor or other care provider tells you to start again. Commonly known as: COGENTIN  Take 0.5 mg by mouth 2 (two) times daily. The timing of this medication is very important.  Indication: Extrapyramidal Reaction caused by Medications       TAKE these medications       Indication  atorvastatin  80 MG tablet Commonly known as: LIPITOR Take 80 mg by mouth daily.  Indication: High Amount of Fats in the Blood   haloperidol  10 MG tablet Commonly known as: HALDOL  Take 1 tablet (10 mg total) by mouth at bedtime. What changed:  medication strength how much to take when to take this  Indication: Manic Phase of Manic-Depression   haloperidol  5 MG tablet Commonly known as: HALDOL  Take 1 tablet (5 mg total) by mouth daily. What changed: You were already taking a medication with the same name, and this prescription was added. Make sure you understand how and when to take each.  Indication: Manic Phase of Manic-Depression   hydrOXYzine  25 MG tablet Commonly known as: ATARAX  Take 25 mg by mouth at bedtime as needed (For anxiety or insomnia).  Indication: Feeling Anxious   lithium  carbonate 300 MG capsule Take 900 mg by mouth at bedtime.  Indication: Manic-Depression   LORazepam  1 MG tablet Commonly known as: ATIVAN  Take 1 mg by mouth 4 (four) times daily as needed for anxiety.  Indication: Feeling Anxious   Vitamin D  (Ergocalciferol ) 1.25 MG (50000 UNIT) Caps capsule Commonly known as: DRISDOL  Take 50,000 Units by mouth every 7 (seven) days.  Indication: Vitamin D  Deficiency        Follow-up Information     [facility]. Go on 03/21/2024.  Specialty: Behavioral Health Why: You may go to this provider on 03/21/24 at 7:00 am for an assessment, to obtain medication management services.  You may also go Monday through Friday, arrive by 7:00 am for assessments. Contact information: [address] [place]  [address] [phone]        [facility]. Go on 03/14/2024.   Specialty: Professional Counselor Why: You may go to this provider on 03/14/24 at 9:00 am to register for services.  At this time, you will be scheduled for a clinical assessment, in order to obtain a therapy appointment.   You  may also go Monday thru Friday, from 9 am to 1 pm to register for services. Contact information: [address] [place]  [address] [place] [address] [phone]         [facility]. Go on 03/07/2024.   Why: Please go to this provider on 03/07/24 at 8:30 am for an assessment, to establish care for therapy and medication management services. Contact information: [address] [place], [place] [address] Hours: Mon-Fri: 8AM to 5PM  Phone: [phone] Fax: [phone]                Follow-up recommendations:  Activity:  As tolerated Diet:  Regular   Signed: Oliva DELENA Salmon, DO 03/04/2024, 11:27 AM           [1]  Social History Tobacco Use  Smoking Status Never  Smokeless Tobacco Never   "

## 2024-03-08 ENCOUNTER — Telehealth (HOSPITAL_COMMUNITY): Payer: Self-pay | Admitting: Student in an Organized Health Care Education/Training Program

## 2024-03-08 NOTE — Telephone Encounter (Signed)
 Unable to reach the patient at her mobile number.  Contacted and spoke with her husband, Unice, at his mobile number.  Husband reports patient has been improving since her discharge from [facility] and has been sleeping a lot better.  I was also able to speak with the patient briefly on the phone, who confirms she is doing well and will see me for follow-up on 03/30/2024.

## 2024-03-29 ENCOUNTER — Ambulatory Visit: Payer: Self-pay | Admitting: "Endocrinology

## 2024-03-30 ENCOUNTER — Encounter (HOSPITAL_COMMUNITY): Admitting: Student in an Organized Health Care Education/Training Program
